# Patient Record
Sex: Female | Born: 1960 | Race: Black or African American | Hispanic: No | Marital: Single | Smoking: Never smoker
Health system: Southern US, Community
[De-identification: ages and names within clinical notes are randomized; demographics above are authoritative.]

## PROBLEM LIST (undated history)

## (undated) DIAGNOSIS — I2510 Atherosclerotic heart disease of native coronary artery without angina pectoris: Secondary | ICD-10-CM

## (undated) DIAGNOSIS — I509 Heart failure, unspecified: Secondary | ICD-10-CM

## (undated) DIAGNOSIS — I252 Old myocardial infarction: Secondary | ICD-10-CM

## (undated) DIAGNOSIS — K829 Disease of gallbladder, unspecified: Secondary | ICD-10-CM

## (undated) DIAGNOSIS — J45909 Unspecified asthma, uncomplicated: Secondary | ICD-10-CM

## (undated) DIAGNOSIS — K219 Gastro-esophageal reflux disease without esophagitis: Secondary | ICD-10-CM

## (undated) DIAGNOSIS — E785 Hyperlipidemia, unspecified: Secondary | ICD-10-CM

## (undated) DIAGNOSIS — N200 Calculus of kidney: Secondary | ICD-10-CM

## (undated) DIAGNOSIS — I10 Essential (primary) hypertension: Secondary | ICD-10-CM

## (undated) DIAGNOSIS — M1990 Unspecified osteoarthritis, unspecified site: Secondary | ICD-10-CM

## (undated) DIAGNOSIS — F419 Anxiety disorder, unspecified: Secondary | ICD-10-CM

## (undated) DIAGNOSIS — E559 Vitamin D deficiency, unspecified: Secondary | ICD-10-CM

## (undated) DIAGNOSIS — D649 Anemia, unspecified: Secondary | ICD-10-CM

## (undated) DIAGNOSIS — Z923 Personal history of irradiation: Secondary | ICD-10-CM

## (undated) DIAGNOSIS — C50919 Malignant neoplasm of unspecified site of unspecified female breast: Secondary | ICD-10-CM

## (undated) DIAGNOSIS — M2550 Pain in unspecified joint: Secondary | ICD-10-CM

## (undated) DIAGNOSIS — M25569 Pain in unspecified knee: Secondary | ICD-10-CM

## (undated) HISTORY — DX: Disease of gallbladder, unspecified: K82.9

## (undated) HISTORY — PX: BREAST SURGERY: SHX581

## (undated) HISTORY — PX: CHOLECYSTECTOMY: SHX55

## (undated) HISTORY — PX: COLONOSCOPY: SHX174

## (undated) HISTORY — DX: Pain in unspecified knee: M25.569

## (undated) HISTORY — DX: Unspecified asthma, uncomplicated: J45.909

## (undated) HISTORY — PX: HAND SURGERY: SHX662

## (undated) HISTORY — DX: Hyperlipidemia, unspecified: E78.5

## (undated) HISTORY — PX: BREAST EXCISIONAL BIOPSY: SUR124

## (undated) HISTORY — DX: Pain in unspecified joint: M25.50

## (undated) HISTORY — DX: Heart failure, unspecified: I50.9

## (undated) HISTORY — PX: ROUX-EN-Y GASTRIC BYPASS: SHX1104

---

## 2001-03-15 DIAGNOSIS — I252 Old myocardial infarction: Secondary | ICD-10-CM

## 2001-03-15 HISTORY — DX: Old myocardial infarction: I25.2

## 2008-11-23 ENCOUNTER — Emergency Department (HOSPITAL_COMMUNITY): Admission: EM | Admit: 2008-11-23 | Discharge: 2008-11-23 | Payer: Self-pay | Admitting: Emergency Medicine

## 2010-04-06 ENCOUNTER — Encounter: Payer: Self-pay | Admitting: Family Medicine

## 2010-04-25 ENCOUNTER — Emergency Department (HOSPITAL_COMMUNITY)
Admission: EM | Admit: 2010-04-25 | Discharge: 2010-04-25 | Disposition: A | Discharge status: Home / Self Care | Payer: Self-pay | Attending: Emergency Medicine | Admitting: Emergency Medicine

## 2010-04-25 DIAGNOSIS — E119 Type 2 diabetes mellitus without complications: Secondary | ICD-10-CM | POA: Insufficient documentation

## 2010-04-25 DIAGNOSIS — R059 Cough, unspecified: Secondary | ICD-10-CM | POA: Insufficient documentation

## 2010-04-25 DIAGNOSIS — R05 Cough: Secondary | ICD-10-CM | POA: Insufficient documentation

## 2010-04-25 DIAGNOSIS — I10 Essential (primary) hypertension: Secondary | ICD-10-CM | POA: Insufficient documentation

## 2010-04-25 DIAGNOSIS — E785 Hyperlipidemia, unspecified: Secondary | ICD-10-CM | POA: Insufficient documentation

## 2010-06-19 LAB — CBC
HCT: 39.3 % (ref 36.0–46.0)
Hemoglobin: 13 g/dL (ref 12.0–15.0)
MCHC: 33 g/dL (ref 30.0–36.0)
MCV: 89.1 fL (ref 78.0–100.0)
Platelets: 278 10*3/uL (ref 150–400)
RBC: 4.41 MIL/uL (ref 3.87–5.11)
RDW: 14.8 % (ref 11.5–15.5)
WBC: 8.6 10*3/uL (ref 4.0–10.5)

## 2010-06-19 LAB — DIFFERENTIAL
Basophils Absolute: 0.2 10*3/uL — ABNORMAL HIGH (ref 0.0–0.1)
Basophils Relative: 2 % — ABNORMAL HIGH (ref 0–1)
Eosinophils Absolute: 0.3 10*3/uL (ref 0.0–0.7)
Eosinophils Relative: 3 % (ref 0–5)
Lymphocytes Relative: 41 % (ref 12–46)
Lymphs Abs: 3.5 10*3/uL (ref 0.7–4.0)
Monocytes Absolute: 0.8 10*3/uL (ref 0.1–1.0)
Monocytes Relative: 9 % (ref 3–12)
Neutro Abs: 3.8 10*3/uL (ref 1.7–7.7)
Neutrophils Relative %: 44 % (ref 43–77)

## 2010-06-19 LAB — POCT CARDIAC MARKERS
CKMB, poc: 1.3 ng/mL (ref 1.0–8.0)
CKMB, poc: 1.5 ng/mL (ref 1.0–8.0)
CKMB, poc: <1 ng/mL — ABNORMAL LOW (ref 1.0–8.0)
Myoglobin, poc: 106 ng/mL (ref 12–200)
Myoglobin, poc: 73.6 ng/mL (ref 12–200)
Myoglobin, poc: 98.2 ng/mL (ref 12–200)
Troponin i, poc: 0.06 ng/mL (ref 0.00–0.09)
Troponin i, poc: <0.05 ng/mL (ref 0.00–0.09)
Troponin i, poc: <0.05 ng/mL (ref 0.00–0.09)

## 2010-06-19 LAB — URINALYSIS, ROUTINE W REFLEX MICROSCOPIC
Bilirubin Urine: NEGATIVE
Glucose, UA: NEGATIVE mg/dL
Hgb urine dipstick: NEGATIVE
Ketones, ur: NEGATIVE mg/dL
Nitrite: NEGATIVE
Protein, ur: NEGATIVE mg/dL
Specific Gravity, Urine: 1.011 (ref 1.005–1.030)
Urobilinogen, UA: 0.2 mg/dL (ref 0.0–1.0)
pH: 7 (ref 5.0–8.0)

## 2010-06-19 LAB — URINE MICROSCOPIC-ADD ON

## 2010-06-19 LAB — POCT I-STAT, CHEM 8
BUN: 11 mg/dL (ref 6–23)
Calcium, Ion: 1.07 mmol/L — ABNORMAL LOW (ref 1.12–1.32)
Chloride: 102 mEq/L (ref 96–112)
Creatinine, Ser: 0.9 mg/dL (ref 0.4–1.2)
Glucose, Bld: 118 mg/dL — ABNORMAL HIGH (ref 70–99)
HCT: 41 % (ref 36.0–46.0)
Hemoglobin: 13.9 g/dL (ref 12.0–15.0)
Potassium: 3.3 mEq/L — ABNORMAL LOW (ref 3.5–5.1)
Sodium: 140 mEq/L (ref 135–145)
TCO2: 27 mmol/L (ref 0–100)

## 2010-06-19 LAB — D-DIMER, QUANTITATIVE: D-Dimer, Quant: 0.26 ug/mL-FEU (ref 0.00–0.48)

## 2010-06-19 LAB — PREGNANCY, URINE: Preg Test, Ur: NEGATIVE

## 2011-04-04 ENCOUNTER — Emergency Department (HOSPITAL_COMMUNITY)
Admission: EM | Admit: 2011-04-04 | Discharge: 2011-04-04 | Disposition: A | Discharge status: Home / Self Care | Payer: Self-pay | Attending: Emergency Medicine | Admitting: Emergency Medicine

## 2011-04-04 ENCOUNTER — Encounter (HOSPITAL_COMMUNITY): Payer: Self-pay | Admitting: Emergency Medicine

## 2011-04-04 DIAGNOSIS — R0989 Other specified symptoms and signs involving the circulatory and respiratory systems: Secondary | ICD-10-CM | POA: Insufficient documentation

## 2011-04-04 DIAGNOSIS — J3489 Other specified disorders of nose and nasal sinuses: Secondary | ICD-10-CM | POA: Insufficient documentation

## 2011-04-04 DIAGNOSIS — R51 Headache: Secondary | ICD-10-CM | POA: Insufficient documentation

## 2011-04-04 DIAGNOSIS — R05 Cough: Secondary | ICD-10-CM | POA: Insufficient documentation

## 2011-04-04 DIAGNOSIS — J069 Acute upper respiratory infection, unspecified: Secondary | ICD-10-CM | POA: Insufficient documentation

## 2011-04-04 DIAGNOSIS — IMO0001 Reserved for inherently not codable concepts without codable children: Secondary | ICD-10-CM | POA: Insufficient documentation

## 2011-04-04 DIAGNOSIS — R059 Cough, unspecified: Secondary | ICD-10-CM | POA: Insufficient documentation

## 2011-04-04 DIAGNOSIS — I10 Essential (primary) hypertension: Secondary | ICD-10-CM | POA: Insufficient documentation

## 2011-04-04 DIAGNOSIS — E119 Type 2 diabetes mellitus without complications: Secondary | ICD-10-CM | POA: Insufficient documentation

## 2011-04-04 DIAGNOSIS — R6889 Other general symptoms and signs: Secondary | ICD-10-CM | POA: Insufficient documentation

## 2011-04-04 HISTORY — DX: Essential (primary) hypertension: I10

## 2011-04-04 MED ORDER — HYDROCOD POLST-CHLORPHEN POLST 10-8 MG/5ML PO LQCR: 5.0000 mL | Freq: Two times a day (BID) | ORAL | Status: DC | PRN

## 2011-04-04 MED ORDER — FLUTICASONE PROPIONATE 50 MCG/ACT NA SUSP: 2.0000 | Freq: Every day | NASAL | Status: DC

## 2011-04-04 NOTE — ED Notes (Signed)
Dc instructions given and understanding verbalized 

## 2011-04-04 NOTE — ED Notes (Signed)
C/o productive cough with clear sputum, congestion, headache, toothache, and body aches since Wednesday.

## 2011-04-04 NOTE — ED Provider Notes (Signed)
Medical screening examination/treatment/procedure(s) were performed by non-physician practitioner and as supervising physician I was immediately available for consultation/collaboration.   Dione Booze, MD 04/04/11 [phone]

## 2011-04-04 NOTE — ED Provider Notes (Signed)
History     CSN: [id number]  Arrival date & time 04/04/11  1758   First MD Initiated Contact with Patient 04/04/11 1929      Chief Complaint  Patient presents with  . Cough    (Consider location/radiation/quality/duration/timing/severity/associated sxs/prior treatment) HPI  51 year old female presenting to the ED with chief complaints of flulike symptoms. Patient complaining of nonproductive cough, sneezing, nasal congestion, chest congestions, myalgia for the past 4 days. She also has associated headache and body aches. She denies fever, vomiting, diarrhea, rash. She has tried over-the-counter medication without relief. She has had sick contact. She has not had a flu shot this year. She has appetite and does eat and drink as normal.  Past Medical History  Diagnosis Date  . Hypertension   . Diabetes mellitus     Past Surgical History  Procedure Date  . Breast surgery   . Cholecystectomy     No family history on file.  History  Substance Use Topics  . Smoking status: Never Smoker   . Smokeless tobacco: Not on file  . Alcohol Use: No    OB History    Grav Para Term Preterm Abortions TAB SAB Ect Mult Living                  Review of Systems  All other systems reviewed and are negative.    Allergies  Review of patient's allergies indicates no known allergies.  Home Medications   Current Outpatient Rx  Name Route Sig Dispense Refill  . LISINOPRIL-HYDROCHLOROTHIAZIDE 20-25 MG PO TABS Oral Take 1 tablet by mouth daily.    Marland Kitchen METFORMIN HCL 500 MG PO TABS Oral Take 500 mg by mouth 2 (two) times daily with a meal.      BP 176/89  Pulse 81  Temp(Src) 98.6 F (37 C) (Oral)  Resp 16  SpO2 98%  LMP 04/02/2011  Physical Exam  Nursing note and vitals reviewed. Constitutional: She is oriented to person, place, and time. She appears well-developed and well-nourished. No distress.       Awake, alert, nontoxic appearance  HENT:  Head: Normocephalic and  atraumatic.  Right Ear: External ear normal.  Left Ear: External ear normal.  Nose: Nose normal.  Mouth/Throat: Oropharynx is clear and moist. No oropharyngeal exudate.  Eyes: Conjunctivae and EOM are normal. Pupils are equal, round, and reactive to light. Right eye exhibits no discharge. Left eye exhibits no discharge.  Neck: Neck supple.  Cardiovascular: Normal rate and regular rhythm.   Pulmonary/Chest: Effort normal and breath sounds normal. No respiratory distress. She has no wheezes. She has no rales. She exhibits no tenderness.  Abdominal: Soft. There is no tenderness. There is no rebound.  Musculoskeletal: She exhibits no tenderness.       Baseline ROM, no obvious new focal weakness  Lymphadenopathy:    She has no cervical adenopathy.  Neurological: She is alert and oriented to person, place, and time.       Mental status and motor strength appears baseline for patient and situation  Skin: No rash noted.  Psychiatric: She has a normal mood and affect.    ED Course  Procedures (including critical care time)  Labs Reviewed - No data to display No results found.   No diagnosis found.    MDM  URI symptoms.  Reassurance given.  Pt is afebrile with stable vital sign.  Her lung is clear to auscultation bilaterally.  Pt will be d/c with flonase and cough medication.  Fayrene Helper, PA-C 04/04/11 2032

## 2011-05-22 ENCOUNTER — Encounter (HOSPITAL_COMMUNITY): Payer: Self-pay | Admitting: *Deleted

## 2011-05-22 ENCOUNTER — Emergency Department (HOSPITAL_COMMUNITY)
Admission: EM | Admit: 2011-05-22 | Discharge: 2011-05-22 | Disposition: A | Discharge status: Home Health | Payer: Self-pay | Attending: Emergency Medicine | Admitting: Emergency Medicine

## 2011-05-22 ENCOUNTER — Emergency Department (HOSPITAL_COMMUNITY): Payer: Self-pay

## 2011-05-22 DIAGNOSIS — R112 Nausea with vomiting, unspecified: Secondary | ICD-10-CM | POA: Insufficient documentation

## 2011-05-22 DIAGNOSIS — Z79899 Other long term (current) drug therapy: Secondary | ICD-10-CM | POA: Insufficient documentation

## 2011-05-22 DIAGNOSIS — B9789 Other viral agents as the cause of diseases classified elsewhere: Secondary | ICD-10-CM | POA: Insufficient documentation

## 2011-05-22 DIAGNOSIS — R509 Fever, unspecified: Secondary | ICD-10-CM | POA: Insufficient documentation

## 2011-05-22 DIAGNOSIS — R059 Cough, unspecified: Secondary | ICD-10-CM | POA: Insufficient documentation

## 2011-05-22 DIAGNOSIS — R070 Pain in throat: Secondary | ICD-10-CM | POA: Insufficient documentation

## 2011-05-22 DIAGNOSIS — IMO0001 Reserved for inherently not codable concepts without codable children: Secondary | ICD-10-CM | POA: Insufficient documentation

## 2011-05-22 DIAGNOSIS — J3489 Other specified disorders of nose and nasal sinuses: Secondary | ICD-10-CM | POA: Insufficient documentation

## 2011-05-22 DIAGNOSIS — E119 Type 2 diabetes mellitus without complications: Secondary | ICD-10-CM | POA: Insufficient documentation

## 2011-05-22 DIAGNOSIS — M549 Dorsalgia, unspecified: Secondary | ICD-10-CM | POA: Insufficient documentation

## 2011-05-22 DIAGNOSIS — B349 Viral infection, unspecified: Secondary | ICD-10-CM

## 2011-05-22 DIAGNOSIS — R05 Cough: Secondary | ICD-10-CM

## 2011-05-22 DIAGNOSIS — H5710 Ocular pain, unspecified eye: Secondary | ICD-10-CM | POA: Insufficient documentation

## 2011-05-22 DIAGNOSIS — I10 Essential (primary) hypertension: Secondary | ICD-10-CM | POA: Insufficient documentation

## 2011-05-22 DIAGNOSIS — H9209 Otalgia, unspecified ear: Secondary | ICD-10-CM | POA: Insufficient documentation

## 2011-05-22 LAB — COMPREHENSIVE METABOLIC PANEL
ALT: 16 U/L (ref 0–35)
AST: 26 U/L (ref 0–37)
Albumin: 3.5 g/dL (ref 3.5–5.2)
Alkaline Phosphatase: 63 U/L (ref 39–117)
BUN: 12 mg/dL (ref 6–23)
CO2: 25 mEq/L (ref 19–32)
Calcium: 8.6 mg/dL (ref 8.4–10.5)
Chloride: 98 mEq/L (ref 96–112)
Creatinine, Ser: 0.94 mg/dL (ref 0.50–1.10)
GFR calc Af Amer: 82 mL/min — ABNORMAL LOW (ref >90)
GFR calc non Af Amer: 71 mL/min — ABNORMAL LOW (ref >90)
Glucose, Bld: 117 mg/dL — ABNORMAL HIGH (ref 70–99)
Potassium: 3.2 mEq/L — ABNORMAL LOW (ref 3.5–5.1)
Sodium: 135 mEq/L (ref 135–145)
Total Bilirubin: 0.2 mg/dL — ABNORMAL LOW (ref 0.3–1.2)
Total Protein: 7.4 g/dL (ref 6.0–8.3)

## 2011-05-22 LAB — DIFFERENTIAL
Basophils Absolute: 0 10*3/uL (ref 0.0–0.1)
Basophils Relative: 0 % (ref 0–1)
Eosinophils Absolute: 0 10*3/uL (ref 0.0–0.7)
Eosinophils Relative: 1 % (ref 0–5)
Lymphocytes Relative: 56 % — ABNORMAL HIGH (ref 12–46)
Lymphs Abs: 2.7 10*3/uL (ref 0.7–4.0)
Monocytes Absolute: 0.5 10*3/uL (ref 0.1–1.0)
Monocytes Relative: 11 % (ref 3–12)
Neutro Abs: 1.5 10*3/uL — ABNORMAL LOW (ref 1.7–7.7)
Neutrophils Relative %: 31 % — ABNORMAL LOW (ref 43–77)

## 2011-05-22 LAB — RAPID STREP SCREEN (MED CTR MEBANE ONLY): Streptococcus, Group A Screen (Direct): NEGATIVE

## 2011-05-22 LAB — CBC
HCT: 38.5 % (ref 36.0–46.0)
Hemoglobin: 13.3 g/dL (ref 12.0–15.0)
MCH: 29 pg (ref 26.0–34.0)
MCHC: 34.5 g/dL (ref 30.0–36.0)
MCV: 84.1 fL (ref 78.0–100.0)
Platelets: 225 10*3/uL (ref 150–400)
RBC: 4.58 MIL/uL (ref 3.87–5.11)
RDW: 14.2 % (ref 11.5–15.5)
WBC: 4.8 10*3/uL (ref 4.0–10.5)

## 2011-05-22 LAB — URINALYSIS, ROUTINE W REFLEX MICROSCOPIC
Bilirubin Urine: NEGATIVE
Glucose, UA: NEGATIVE mg/dL
Hgb urine dipstick: NEGATIVE
Ketones, ur: NEGATIVE mg/dL
Leukocytes, UA: NEGATIVE
Nitrite: NEGATIVE
Protein, ur: NEGATIVE mg/dL
Specific Gravity, Urine: 1.008 (ref 1.005–1.030)
Urobilinogen, UA: 0.2 mg/dL (ref 0.0–1.0)
pH: 6.5 (ref 5.0–8.0)

## 2011-05-22 LAB — POCT PREGNANCY, URINE: Preg Test, Ur: NEGATIVE

## 2011-05-22 MED ORDER — IBUPROFEN 600 MG PO TABS: 600.0000 mg | ORAL_TABLET | Freq: Four times a day (QID) | ORAL | Status: AC | PRN

## 2011-05-22 MED ORDER — BENZONATATE 100 MG PO CAPS: 100.0000 mg | ORAL_CAPSULE | Freq: Three times a day (TID) | ORAL | Status: AC | PRN

## 2011-05-22 MED ORDER — DEXAMETHASONE 6 MG PO TABS
12.0000 mg | ORAL_TABLET | ORAL | Status: AC
  Administered 2011-05-22: 12 mg via ORAL
  Filled 2011-05-22: qty 2

## 2011-05-22 NOTE — ED Provider Notes (Signed)
51 year old female has been sick with a respiratory illness for the last week. She's had ear pain, sore throat, cough. Symptoms have been stable and refractory to treatment with over-the-counter medications. On exam, she does have tonsillar erythema and hypertrophy. Her lungs are clear. This most likely represents a viral syndrome, but strep screen and chest x-ray will be obtained as evaluate possible bacterial illness. If negative, she will need to be treated symptomatically.  Dione Booze, MD 05/22/11 [phone]

## 2011-05-22 NOTE — ED Notes (Signed)
The pt has been ill since Monday with a sorethroat earache aching all over hoarseness.  Coughing cold sl temp

## 2011-05-22 NOTE — Discharge Instructions (Signed)
Take Motrin (maximum of 2400mg  per day) or tylenol (maximum of 4000mg  per day) scheduled for 2 days then as needed for pain.     Cough, Adult  A cough is a reflex that helps clear your throat and airways. It can help heal the body or may be a reaction to an irritated airway. A cough may only last 2 or 3 weeks (acute) or may last more than 8 weeks (chronic).  CAUSES Acute cough:  Viral or bacterial infections.  Chronic cough:  Infections.   Allergies.   Asthma.   Post-nasal drip.   Smoking.   Heartburn or acid reflux.   Some medicines.   Chronic lung problems (COPD).   Cancer.  SYMPTOMS   Cough.   Fever.   Chest pain.   Increased breathing rate.   High-pitched whistling sound when breathing (wheezing).   Colored mucus that you cough up (sputum).  TREATMENT   A bacterial cough may be treated with antibiotic medicine.   A viral cough must run its course and will not respond to antibiotics.   Your caregiver may recommend other treatments if you have a chronic cough.  HOME CARE INSTRUCTIONS   Only take over-the-counter or prescription medicines for pain, discomfort, or fever as directed by your caregiver. Use cough suppressants only as directed by your caregiver.   Use a cold steam vaporizer or humidifier in your bedroom or home to help loosen secretions.   Sleep in a semi-upright position if your cough is worse at night.   Rest as needed.   Stop smoking if you smoke.  SEEK IMMEDIATE MEDICAL CARE IF:   You have pus in your sputum.   Your cough starts to worsen.   You cannot control your cough with suppressants and are losing sleep.   You begin coughing up blood.   You have difficulty breathing.   You develop pain which is getting worse or is uncontrolled with medicine.   You have a fever.  MAKE SURE YOU:   Understand these instructions.   Will watch your condition.   Will get help right away if you are not doing well or get worse.  Document  Released: 08/28/2010 Document Revised: 02/18/2011 Document Reviewed: 08/28/2010 [facility] Patient Information 2012 [employer], [place].    Cough, Adult  A cough is a reflex that helps clear your throat and airways. It can help heal the body or may be a reaction to an irritated airway. A cough may only last 2 or 3 weeks (acute) or may last more than 8 weeks (chronic).  CAUSES Acute cough:  Viral or bacterial infections.  Chronic cough:  Infections.   Allergies.   Asthma.   Post-nasal drip.   Smoking.   Heartburn or acid reflux.   Some medicines.   Chronic lung problems (COPD).   Cancer.  SYMPTOMS   Cough.   Fever.   Chest pain.   Increased breathing rate.   High-pitched whistling sound when breathing (wheezing).   Colored mucus that you cough up (sputum).  TREATMENT   A bacterial cough may be treated with antibiotic medicine.   A viral cough must run its course and will not respond to antibiotics.   Your caregiver may recommend other treatments if you have a chronic cough.  HOME CARE INSTRUCTIONS   Only take over-the-counter or prescription medicines for pain, discomfort, or fever as directed by your caregiver. Use cough suppressants only as directed by your caregiver.   Use a cold steam vaporizer or humidifier  in your bedroom or home to help loosen secretions.   Sleep in a semi-upright position if your cough is worse at night.   Rest as needed.   Stop smoking if you smoke.  SEEK IMMEDIATE MEDICAL CARE IF:   You have pus in your sputum.   Your cough starts to worsen.   You cannot control your cough with suppressants and are losing sleep.   You begin coughing up blood.   You have difficulty breathing.   You develop pain which is getting worse or is uncontrolled with medicine.   You have a fever.  MAKE SURE YOU:   Understand these instructions.   Will watch your condition.   Will get help right away if you are not doing well or get worse.    Document Released: 08/28/2010 Document Revised: 02/18/2011 Document Reviewed: 08/28/2010 [facility] Patient Information 2012 [place], [place].

## 2011-05-22 NOTE — ED Notes (Signed)
Pt presents to department for evaluation of multiple complaints. States sore throat, cough, nausea/vomiting and fatigue x7 days. Denies abdominal pain. Abdomen soft and non tender to palpation. Bowel sounds present all quadrants. States decreased PO intake. She is alert and oriented x4. No signs of distress noted at the time.

## 2011-05-22 NOTE — ED Notes (Signed)
Pt resting quietly at the time. No signs of distress noted. Friend at the bedside. Vital signs stable.

## 2011-05-22 NOTE — ED Provider Notes (Signed)
History     CSN: [id number]  Arrival date & time 05/22/11  1638   First MD Initiated Contact with Patient 05/22/11 1720      Chief Complaint  Patient presents with  . Sore Throat  . Nausea  . Emesis    (Consider location/radiation/quality/duration/timing/severity/associated sxs/prior treatment) HPI  History provided by the patient.  51 year old female presenting with complaint of cough and sore throat.  Patient's symptoms began gradually about 5 days ago and has been constant and moderate to severe.  Patient reports persistent productive cough, sore throat, nasal congestion, bilateral ear pain, and some myalgias. Patient reports transient nausea, vomiting, and diarrhea, all of which resolved to 3 days ago. Patient also had one day of subjective fever which has also resolved.  No associated chest pain, significant shortness of breath, or abdominal pain. Patient has had urinary/stress incontinence with coughing without other urinary symptoms. Patient has had similar symptoms in the past. Patient admits to many sick contacts with similar symptoms recently. Patient has had a history of strep throat; although last episode was about 6 years ago   Past Medical History  Diagnosis Date  . Hypertension   . Diabetes mellitus     Past Surgical History  Procedure Date  . Breast surgery   . Cholecystectomy     History reviewed. No pertinent family history.  History  Substance Use Topics  . Smoking status: Never Smoker   . Smokeless tobacco: Not on file  . Alcohol Use: No    OB History    Grav Para Term Preterm Abortions TAB SAB Ect Mult Living                  Review of Systems  Constitutional: Positive for fever (subjective fever 3 days ago - gone). Negative for chills.  HENT: Positive for ear pain (bilateral), congestion and sore throat. Negative for rhinorrhea.   Eyes: Positive for pain (bilateral eye "achiness" recently). Negative for visual disturbance.  Respiratory:  Positive for cough and wheezing. Negative for shortness of breath.   Cardiovascular: Negative for chest pain and palpitations.  Gastrointestinal: Positive for nausea (gone) and vomiting (2-3 days ago - gone). Negative for abdominal pain, diarrhea and blood in stool.  Genitourinary: Negative for dysuria and hematuria.  Musculoskeletal: Positive for back pain (generalized achy). Negative for gait problem.  Skin: Negative for rash and wound.  Neurological: Positive for headaches (occasional, diffuse, mild - gone). Negative for dizziness.  Psychiatric/Behavioral: Negative for confusion and agitation.  All other systems reviewed and are negative.    Allergies  Review of patient's allergies indicates no known allergies.  Home Medications   Current Outpatient Rx  Name Route Sig Dispense Refill  . ALBUTEROL SULFATE HFA 108 (90 BASE) MCG/ACT IN AERS Inhalation Inhale 2 puffs into the lungs every 6 (six) hours as needed. For shortness of breath    . ASPIRIN EFFERVESCENT 325 MG PO TBEF Oral Take 325 mg by mouth every 6 (six) hours as needed. For congestion    . LISINOPRIL-HYDROCHLOROTHIAZIDE 20-25 MG PO TABS Oral Take 1 tablet by mouth daily.    Marland Kitchen METFORMIN HCL 500 MG PO TABS Oral Take 500 mg by mouth 2 (two) times daily with a meal.    . PHENYLEPHRINE-DM 10-20 MG PO STRP Oral Take 10 mLs by mouth 2 (two) times daily.    Marland Kitchen PSEUDOEPHEDRINE-IBUPROFEN 30-200 MG PO TABS Oral Take 2 tablets by mouth daily.      BP 164/83  Pulse 78  Temp(Src) 98.6 F (37 C) (Oral)  Resp 16  SpO2 96%  LMP 04/24/2011  Physical Exam  Nursing note and vitals reviewed. Constitutional: She is oriented to person, place, and time. No distress.       Morbidly obese, alert, in no acute distress  HENT:  Head: Normocephalic and atraumatic.  Right Ear: External ear normal.  Left Ear: External ear normal.       Bilateral TM without sign of infection; posterior oropharynx with mild erythema, bilateral nodularity tonsils,  with mild amount of exudate on the right; no sign of asymmetry or peritonsillar abscess. Boggy turbinates more on the right  Eyes: Conjunctivae and EOM are normal. Pupils are equal, round, and reactive to light.  Neck: Normal range of motion. Neck supple. No Brudzinski's sign and no Kernig's sign noted.  Cardiovascular: Normal rate, regular rhythm and intact distal pulses.   No murmur heard. Pulmonary/Chest: Effort normal and breath sounds normal. No respiratory distress. She has no wheezes. She has no rales. She exhibits no tenderness.  Abdominal: Soft. Bowel sounds are normal. She exhibits no distension. There is no tenderness.       Obese  Musculoskeletal: Normal range of motion. She exhibits no edema.  Neurological: She is alert and oriented to person, place, and time.  Skin: Skin is warm and dry. No rash noted. She is not diaphoretic.  Psychiatric: She has a normal mood and affect. Judgment normal.    ED Course  Procedures (including critical care time)  Labs Reviewed  DIFFERENTIAL - Abnormal; Notable for the following:    Neutrophils Relative 31 (*)    Neutro Abs 1.5 (*)    Lymphocytes Relative 56 (*)    All other components within normal limits  COMPREHENSIVE METABOLIC PANEL - Abnormal; Notable for the following:    Potassium 3.2 (*)    Glucose, Bld 117 (*)    Total Bilirubin 0.2 (*)    GFR calc non Af Amer 71 (*)    GFR calc Af Amer 82 (*)    All other components within normal limits  URINALYSIS, ROUTINE W REFLEX MICROSCOPIC  CBC  POCT PREGNANCY, URINE  RAPID STREP SCREEN   Dg Chest 2 View  05/22/2011  *RADIOLOGY REPORT*  Clinical Data: Cough for the past week.  CHEST - 2 VIEW  Comparison: 11/23/2008.  Findings: Stable poor inspiration and grossly normal sized heart. Clear lungs.  Mild to moderate diffuse peribronchial thickening. Thoracic spine degenerative changes, including changes of DISH. Cholecystectomy clips.  IMPRESSION: Mild to moderate bronchitic changes.   Original Report Authenticated By: Darrol Angel, M.D.     1. Cough   2. Systemic viral illness      MDM  51 year old female presenting with complaint of cough, sore throat, URI symptoms with now resolved nausea vomiting diarrhea. No documented fever, subjective fever days ago. No abdominal pain or chest pain.   Exam as above, AF, mild tonsillar exudate, clear lungs.  Viral illness most likely.  Will r/o strep and PNA; labs ordered in triage and pending.  CXR without focal consolidation; possible Sn's of bronchitis, likely viral and pt not a current smoker.  Strep neg.  Labs neg for UTI and pt with NL WBC, Hgb, and creat; UPT neg.  Decadron given for sore throat/pain mgmt.  Will d/c with Sx mgmt and PCP f/u.          Particia Lather, MD 05/23/11 [phone]

## 2011-05-23 NOTE — ED Provider Notes (Signed)
I saw and evaluated the patient, reviewed the resident's note and I agree with the findings and plan.   Dione Booze, MD 05/23/11 [phone]

## 2011-08-13 ENCOUNTER — Other Ambulatory Visit (HOSPITAL_COMMUNITY)
Admission: RE | Admit: 2011-08-13 | Discharge: 2011-08-13 | Disposition: A | Discharge status: Home / Self Care | Payer: BC Managed Care – PPO | Source: Ambulatory Visit | Attending: Family Medicine | Admitting: Family Medicine

## 2011-08-13 DIAGNOSIS — Z124 Encounter for screening for malignant neoplasm of cervix: Secondary | ICD-10-CM | POA: Insufficient documentation

## 2011-08-13 DIAGNOSIS — Z1159 Encounter for screening for other viral diseases: Secondary | ICD-10-CM | POA: Insufficient documentation

## 2012-04-14 ENCOUNTER — Other Ambulatory Visit: Payer: Self-pay | Admitting: *Deleted

## 2012-04-14 NOTE — Telephone Encounter (Signed)
error 

## 2012-08-29 ENCOUNTER — Other Ambulatory Visit: Payer: Self-pay

## 2012-08-29 DIAGNOSIS — Z1231 Encounter for screening mammogram for malignant neoplasm of breast: Secondary | ICD-10-CM

## 2012-10-03 ENCOUNTER — Ambulatory Visit
Admission: RE | Admit: 2012-10-03 | Discharge: 2012-10-03 | Disposition: A | Discharge status: Home / Self Care | Payer: BC Managed Care – PPO | Source: Ambulatory Visit

## 2012-10-03 DIAGNOSIS — Z1231 Encounter for screening mammogram for malignant neoplasm of breast: Secondary | ICD-10-CM

## 2012-10-17 ENCOUNTER — Other Ambulatory Visit: Payer: Self-pay | Admitting: Family Medicine

## 2012-10-17 DIAGNOSIS — R928 Other abnormal and inconclusive findings on diagnostic imaging of breast: Secondary | ICD-10-CM

## 2012-10-31 ENCOUNTER — Ambulatory Visit
Admission: RE | Admit: 2012-10-31 | Discharge: 2012-10-31 | Disposition: A | Discharge status: Home / Self Care | Payer: BC Managed Care – PPO | Source: Ambulatory Visit | Attending: Family Medicine | Admitting: Family Medicine

## 2012-10-31 DIAGNOSIS — R928 Other abnormal and inconclusive findings on diagnostic imaging of breast: Secondary | ICD-10-CM

## 2013-03-30 ENCOUNTER — Encounter (HOSPITAL_COMMUNITY): Payer: Self-pay | Admitting: Emergency Medicine

## 2013-03-30 ENCOUNTER — Emergency Department (HOSPITAL_COMMUNITY): Payer: BC Managed Care – PPO

## 2013-03-30 ENCOUNTER — Emergency Department (HOSPITAL_COMMUNITY)
Admission: EM | Admit: 2013-03-30 | Discharge: 2013-03-30 | Disposition: A | Discharge status: Home / Self Care | Payer: BC Managed Care – PPO | Attending: Emergency Medicine | Admitting: Emergency Medicine

## 2013-03-30 DIAGNOSIS — E119 Type 2 diabetes mellitus without complications: Secondary | ICD-10-CM | POA: Insufficient documentation

## 2013-03-30 DIAGNOSIS — I10 Essential (primary) hypertension: Secondary | ICD-10-CM | POA: Insufficient documentation

## 2013-03-30 DIAGNOSIS — R609 Edema, unspecified: Secondary | ICD-10-CM | POA: Insufficient documentation

## 2013-03-30 DIAGNOSIS — R69 Illness, unspecified: Secondary | ICD-10-CM | POA: Insufficient documentation

## 2013-03-30 DIAGNOSIS — R0609 Other forms of dyspnea: Secondary | ICD-10-CM | POA: Insufficient documentation

## 2013-03-30 DIAGNOSIS — R5381 Other malaise: Secondary | ICD-10-CM | POA: Insufficient documentation

## 2013-03-30 DIAGNOSIS — R05 Cough: Secondary | ICD-10-CM | POA: Insufficient documentation

## 2013-03-30 DIAGNOSIS — R5383 Other fatigue: Secondary | ICD-10-CM | POA: Insufficient documentation

## 2013-03-30 DIAGNOSIS — R0600 Dyspnea, unspecified: Secondary | ICD-10-CM

## 2013-03-30 DIAGNOSIS — I252 Old myocardial infarction: Secondary | ICD-10-CM | POA: Insufficient documentation

## 2013-03-30 DIAGNOSIS — R059 Cough, unspecified: Secondary | ICD-10-CM | POA: Insufficient documentation

## 2013-03-30 DIAGNOSIS — I509 Heart failure, unspecified: Secondary | ICD-10-CM

## 2013-03-30 DIAGNOSIS — R0989 Other specified symptoms and signs involving the circulatory and respiratory systems: Secondary | ICD-10-CM | POA: Insufficient documentation

## 2013-03-30 DIAGNOSIS — Z79899 Other long term (current) drug therapy: Secondary | ICD-10-CM | POA: Insufficient documentation

## 2013-03-30 HISTORY — DX: Old myocardial infarction: I25.2

## 2013-03-30 LAB — BASIC METABOLIC PANEL
BUN: 13 mg/dL (ref 6–23)
CO2: 27 mEq/L (ref 19–32)
Calcium: 9 mg/dL (ref 8.4–10.5)
Chloride: 100 mEq/L (ref 96–112)
Creatinine, Ser: 0.89 mg/dL (ref 0.50–1.10)
GFR calc Af Amer: 86 mL/min — ABNORMAL LOW (ref >90)
GFR calc non Af Amer: 74 mL/min — ABNORMAL LOW (ref >90)
Glucose, Bld: 131 mg/dL — ABNORMAL HIGH (ref 70–99)
Potassium: 3.3 mEq/L — ABNORMAL LOW (ref 3.7–5.3)
Sodium: 142 mEq/L (ref 137–147)

## 2013-03-30 LAB — CBC WITH DIFFERENTIAL/PLATELET
Basophils Absolute: 0 10*3/uL (ref 0.0–0.1)
Basophils Relative: 0 % (ref 0–1)
Eosinophils Absolute: 0 10*3/uL (ref 0.0–0.7)
Eosinophils Relative: 0 % (ref 0–5)
HCT: 37.7 % (ref 36.0–46.0)
Hemoglobin: 12.3 g/dL (ref 12.0–15.0)
Lymphocytes Relative: 28 % (ref 12–46)
Lymphs Abs: 3 10*3/uL (ref 0.7–4.0)
MCH: 27.6 pg (ref 26.0–34.0)
MCHC: 32.6 g/dL (ref 30.0–36.0)
MCV: 84.7 fL (ref 78.0–100.0)
Monocytes Absolute: 0.7 10*3/uL (ref 0.1–1.0)
Monocytes Relative: 7 % (ref 3–12)
Neutro Abs: 6.7 10*3/uL (ref 1.7–7.7)
Neutrophils Relative %: 64 % (ref 43–77)
Platelets: 271 10*3/uL (ref 150–400)
RBC: 4.45 MIL/uL (ref 3.87–5.11)
RDW: 14.6 % (ref 11.5–15.5)
WBC: 10.5 10*3/uL (ref 4.0–10.5)

## 2013-03-30 LAB — PRO B NATRIURETIC PEPTIDE: Pro B Natriuretic peptide (BNP): 784 pg/mL — ABNORMAL HIGH (ref 0–125)

## 2013-03-30 LAB — TROPONIN I: Troponin I: <0.3 ng/mL (ref <0.30)

## 2013-03-30 MED ORDER — FUROSEMIDE 10 MG/ML IJ SOLN
40.0000 mg | Freq: Once | INTRAMUSCULAR | Status: AC
  Administered 2013-03-30: 40 mg via INTRAVENOUS
  Filled 2013-03-30: qty 4

## 2013-03-30 MED ORDER — POTASSIUM CHLORIDE CRYS ER 20 MEQ PO TBCR: 20.0000 meq | EXTENDED_RELEASE_TABLET | Freq: Two times a day (BID) | ORAL | Status: DC

## 2013-03-30 MED ORDER — FUROSEMIDE 20 MG PO TABS: 20.0000 mg | ORAL_TABLET | Freq: Two times a day (BID) | ORAL | Status: DC

## 2013-03-30 MED ORDER — POTASSIUM CHLORIDE CRYS ER 20 MEQ PO TBCR
60.0000 meq | EXTENDED_RELEASE_TABLET | Freq: Once | ORAL | Status: AC
  Administered 2013-03-30: 60 meq via ORAL
  Filled 2013-03-30: qty 3

## 2013-03-30 NOTE — ED Notes (Signed)
Per pt, started getting sick yesterday, saw PCP and was told to come to ED for chest xray-states MD heard crackles

## 2013-03-30 NOTE — ED Provider Notes (Signed)
CSN: [id number]     Arrival date & time 03/30/13  1324 History   First MD Initiated Contact with Patient 03/30/13 1500     Chief Complaint  Patient presents with  . Shortness of Breath   (Consider location/radiation/quality/duration/timing/severity/associated sxs/prior Treatment) HPI  52yF with SOB. Began feeling "sick" yesterday. Cough. Generalized fatigue, no energy. No fever. Has felt chilled. Sob worse when laying in back. No unusual leg pain or swelling. No sick contacts. No urinary complaints. Nonsmoker.   Past Medical History  Diagnosis Date  . Hypertension   . Diabetes mellitus   . 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 Breast surgery    . Cholecystectomy     No family history on file. History  Substance Use Topics  . Smoking status: Never Smoker   . Smokeless tobacco: Not on file  . Alcohol Use: No   OB History   Grav Para Term Preterm Abortions TAB SAB Ect Mult Living                 Review of Systems  All systems reviewed and negative, other than as noted in HPI.   Allergies  Lisinopril-hydrochlorothiazide  Home Medications   Current Outpatient Rx  Name  Route  Sig  Dispense  Refill  . albuterol (PROVENTIL HFA;VENTOLIN HFA) 108 (90 BASE) MCG/ACT inhaler   Inhalation   Inhale 2 puffs into the lungs every 6 (six) hours as needed. For shortness of breath         . ibuprofen (ADVIL,MOTRIN) 200 MG tablet   Oral   Take 800 mg by mouth every 6 (six) hours as needed.         . sitaGLIPtin (JANUVIA) 100 MG tablet   Oral   Take 50 mg by mouth daily.          BP 181/92  Pulse 76  Temp(Src) 98.1 F (36.7 C) (Oral)  Resp 20  SpO2 95%  LMP 03/21/2013 Physical Exam  Nursing note and vitals reviewed. Constitutional: She appears well-developed and well-nourished. No distress.  HENT:  Head: Normocephalic and atraumatic.  Eyes: Conjunctivae are normal. Right eye exhibits no discharge. Left eye  exhibits no discharge.  Neck: Neck supple.  Cardiovascular: Normal rate, regular rhythm and normal heart sounds.  Exam reveals no gallop and no friction rub.   No murmur heard. Pulmonary/Chest: Effort normal and breath sounds normal. No respiratory distress.  Crackles b/l bases  Abdominal: Soft. She exhibits no distension. There is no tenderness.  Musculoskeletal: She exhibits edema. She exhibits no tenderness.  Neurological: She is alert.  Skin: Skin is warm and dry.  Psychiatric: She has a normal mood and affect. Her behavior is normal. Thought content normal.    ED Course  Procedures (including critical care time) Labs Review Labs Reviewed  PRO B NATRIURETIC PEPTIDE - Abnormal; Notable for the following:    Pro B Natriuretic peptide (BNP) 784.0 (*)    All other components within normal limits  BASIC METABOLIC PANEL - Abnormal; Notable for the following:    Potassium 3.3 (*)    Glucose, Bld 131 (*)    GFR calc non Af Amer 74 (*)    GFR calc Af Amer 86 (*)    All other components within normal limits  TROPONIN I  CBC WITH DIFFERENTIAL   Imaging Review Dg Chest 2 View  03/30/2013   CLINICAL DATA:  Shortness of breath and  mid chest pain beginning 1 day ago.  EXAM: CHEST  2 VIEW  COMPARISON:  PA and lateral chest 05/22/2011.  FINDINGS: The lungs are clear. Heart size is normal. No pneumothorax or pleural effusion.  IMPRESSION: No acute disease.   Electronically Signed   By: Inge Rise M.D.   On: 03/30/2013 14:33    EKG Interpretation    Date/Time:  Friday March 30 2013 15:47:59 EST Ventricular Rate:  80 PR Interval:  164 QRS Duration: 86 QT Interval:  413 QTC Calculation: 476 R Axis:   36 Text Interpretation:  ED PHYSICIAN INTERPRETATION AVAILABLE IN [facility] Confirmed by TEST, RECORD ([id number]) on 04/01/2013 11:54:24 AM            MDM   1. Dyspnea   2. Heart failure    52yF with cough and sob. Clinically mild chf. Short course diuretics. Close pcp  fu. Return precautions discussed.    Virgel Manifold, MD 04/02/13 2256

## 2013-03-30 NOTE — Discharge Instructions (Signed)
Heart Failure °Heart failure is a condition in which the heart has trouble pumping blood. This means your heart does not pump blood efficiently for your body to work well. In some cases of heart failure, fluid may back up into your lungs or you may have swelling (edema) in your lower legs. Heart failure is usually a long-term (chronic) condition. It is important for you to take good care of yourself and follow your caregiver's treatment plan. °CAUSES  °Some health conditions can cause heart failure. Those health conditions include: °· High blood pressure (hypertension) causes the heart muscle to work harder than normal. When pressure in the blood vessels is high, the heart needs to pump (contract) with more force in order to circulate blood throughout the body. High blood pressure eventually causes the heart to become stiff and weak. °· Coronary artery disease (CAD) is the buildup of cholesterol and fat (plaque) in the arteries of the heart. The blockage in the arteries deprives the heart muscle of oxygen and blood. This can cause chest pain and may lead to a heart attack. High blood pressure can also contribute to CAD. °· Heart attack (myocardial infarction) occurs when 1 or more arteries in the heart become blocked. The loss of oxygen damages the muscle tissue of the heart. When this happens, part of the heart muscle dies. The injured tissue does not contract as well and weakens the heart's ability to pump blood. °· Abnormal heart valves can cause heart failure when the heart valves do not open and close properly. This makes the heart muscle pump harder to keep the blood flowing. °· Heart muscle disease (cardiomyopathy or myocarditis) is damage to the heart muscle from a variety of causes. These can include drug or alcohol abuse, infections, or unknown reasons. These can increase the risk of heart failure. °· Lung disease makes the heart work harder because the lungs do not work properly. This can cause a strain  on the heart, leading it to fail. °· Diabetes increases the risk of heart failure. High blood sugar contributes to high fat (lipid) levels in the blood. Diabetes can also cause slow damage to tiny blood vessels that carry important nutrients to the heart muscle. When the heart does not get enough oxygen and food, it can cause the heart to become weak and stiff. This leads to a heart that does not contract efficiently. °· Other conditions can contribute to heart failure. These include abnormal heart rhythms, thyroid problems, and low blood counts (anemia). °Certain unhealthy behaviors can increase the risk of heart failure. Those unhealthy behaviors include: °· Being overweight. °· Smoking or chewing tobacco. °· Eating foods high in fat and cholesterol. °· Abusing illicit drugs or alcohol. °· Lacking physical activity. °SYMPTOMS  °Heart failure symptoms may vary and can be hard to detect. Symptoms may include: °· Shortness of breath with activity, such as climbing stairs. °· Persistent cough. °· Swelling of the feet, ankles, legs, or abdomen. °· Unexplained weight gain. °· Difficulty breathing when lying flat (orthopnea). °· Waking from sleep because of the need to sit up and get more air. °· Rapid heartbeat. °· Fatigue and loss of energy. °· Feeling lightheaded, dizzy, or close to fainting. °· Loss of appetite. °· Nausea. °· Increased urination during the night (nocturia). °DIAGNOSIS  °A diagnosis of heart failure is based on your history, symptoms, physical examination, and diagnostic tests. °Diagnostic tests for heart failure may include: °· Echocardiography. °· Electrocardiography. °· Chest X-ray. °· Blood tests. °· Exercise   stress test. °· Cardiac angiography. °· Radionuclide scans. °TREATMENT  °Treatment is aimed at managing the symptoms of heart failure. Medicines, behavioral changes, or surgical intervention may be necessary to treat heart failure. °· Medicines to help treat heart failure may  include: °· Angiotensin-converting enzyme (ACE) inhibitors. This type of medicine blocks the effects of a blood protein called angiotensin-converting enzyme. ACE inhibitors relax (dilate) the blood vessels and help lower blood pressure. °· Angiotensin receptor blockers. This type of medicine blocks the actions of a blood protein called angiotensin. Angiotensin receptor blockers dilate the blood vessels and help lower blood pressure. °· Water pills (diuretics). Diuretics cause the kidneys to remove salt and water from the blood. The extra fluid is removed through urination. This loss of extra fluid lowers the volume of blood the heart pumps. °· Beta blockers. These prevent the heart from beating too fast and improve heart muscle strength. °· Digitalis. This increases the force of the heartbeat. °· Healthy behavior changes include: °· Obtaining and maintaining a healthy weight. °· Stopping smoking or chewing tobacco. °· Eating heart healthy foods. °· Limiting or avoiding alcohol. °· Stopping illicit drug use. °· Physical activity as directed by your caregiver. °· Surgical treatment for heart failure may include: °· A procedure to open blocked arteries, repair damaged heart valves, or remove damaged heart muscle tissue. °· A pacemaker to improve heart muscle function and control certain abnormal heart rhythms. °· An internal cardioverter defibrillator to treat certain serious abnormal heart rhythms. °· A left ventricular assist device to assist the pumping ability of the heart. °HOME CARE INSTRUCTIONS  °· Take your medicine as directed by your caregiver. Medicines are important in reducing the workload of your heart, slowing the progression of heart failure, and improving your symptoms. °· Do not stop taking your medicine unless directed by your caregiver. °· Do not skip any dose of medicine. °· Refill your prescriptions before you run out of medicine. Your medicines are needed every day. °· Take over-the-counter  medicine only as directed by your caregiver or pharmacist. °· Engage in moderate physical activity if directed by your caregiver. Moderate physical activity can benefit some people. The elderly and people with severe heart failure should consult with a caregiver for physical activity recommendations. °· Eat heart healthy foods. Food choices should be free of trans fat and low in saturated fat, cholesterol, and salt (sodium). Healthy choices include fresh or frozen fruits and vegetables, fish, lean meats, legumes, fat-free or low-fat dairy products, and whole grain or high fiber foods. Talk to a dietitian to learn more about heart healthy foods. °· Limit sodium if directed by your caregiver. Sodium restriction may reduce symptoms of heart failure in some people. Talk to a dietitian to learn more about heart healthy seasonings. °· Use healthy cooking methods. Healthy cooking methods include roasting, grilling, broiling, baking, poaching, steaming, or stir-frying. Talk to a dietitian to learn more about healthy cooking methods. °· Limit fluids if directed by your caregiver. Fluid restriction may reduce symptoms of heart failure in some people. °· Weigh yourself every day. Daily weights are important in the early recognition of excess fluid. You should weigh yourself every morning after you urinate and before you eat breakfast. Wear the same amount of clothing each time you weigh yourself. Record your daily weight. Provide your caregiver with your weight record. °· Monitor and record your blood pressure if directed by your caregiver. °· Check your pulse if directed by your caregiver. °· Lose weight if directed   by your caregiver. Weight loss may reduce symptoms of heart failure in some people. °· Stop smoking or chewing tobacco. Nicotine makes your heart work harder by causing your blood vessels to constrict. Do not use nicotine gum or patches before talking to your caregiver. °· Schedule and attend follow-up visits as  directed by your caregiver. It is important to keep all your appointments. °· Limit alcohol intake to no more than 1 drink per day for nonpregnant women and 2 drinks per day for men. Drinking more than that is harmful to your heart. Tell your caregiver if you drink alcohol several times a week. Talk with your caregiver about whether alcohol is safe for you. If your heart has already been damaged by alcohol or you have severe heart failure, drinking alcohol should be stopped completely. °· Stop illicit drug use. °· Stay up-to-date with immunizations. It is especially important to prevent respiratory infections through current pneumococcal and influenza immunizations. °· Manage other health conditions such as hypertension, diabetes, thyroid disease, or abnormal heart rhythms as directed by your caregiver. °· Learn to manage stress. °· Plan rest periods when fatigued. °· Learn strategies to manage high temperatures. If the weather is extremely hot: °· Avoid vigorous physical activity. °· Use air conditioning or fans or seek a cooler location. °· Avoid caffeine and alcohol. °· Wear loose-fitting, lightweight, and light-colored clothing. °· Learn strategies to manage cold temperatures. If the weather is extremely cold: °· Avoid vigorous physical activity. °· Layer clothes. °· Wear mittens or gloves, a hat, and a scarf when going outside. °· Avoid alcohol. °· Obtain ongoing education and support as needed. °· Participate or seek rehabilitation as needed to maintain or improve independence and quality of life. °SEEK MEDICAL CARE IF:  °· Your weight increases by 03 lb/1.4 kg in 1 day or 05 lb/2.3 kg in a week. °· You have increasing shortness of breath that is unusual for you. °· You are unable to participate in your usual physical activities. °· You tire easily. °· You cough more than normal, especially with physical activity. °· You have any or more swelling in areas such as your hands, feet, ankles, or abdomen. °· You  are unable to sleep because it is hard to breathe. °· You feel like your heart is beating fast (palpitations). °· You become dizzy or lightheaded upon standing up. °SEEK IMMEDIATE MEDICAL CARE IF:  °· You have difficulty breathing. °· There is a change in mental status such as decreased alertness or difficulty with concentration. °· You have a pain or discomfort in your chest. °· You have an episode of fainting (syncope). °MAKE SURE YOU:  °· Understand these instructions. °· Will watch your condition. °· Will get help right away if you are not doing well or get worse. °Document Released: 03/01/2005 Document Revised: 06/26/2012 Document Reviewed: 03/23/2012 °[employer]® Patient Information ©2014 [employer]. ° °

## 2013-03-30 NOTE — ED Notes (Signed)
Pt has ambulated 2 times to nearby restroom to void.

## 2013-03-30 NOTE — Progress Notes (Signed)
   CARE MANAGEMENT ED NOTE 03/30/2013  Patient:  Brackeen,Shyvonne   Account Number:  [id number]  Date Initiated:  03/30/2013  Documentation initiated by:  Jackelyn Poling  Subjective/Objective Assessment:   53 yr old [facility] pt states sharon wolter is pcp     Subjective/Objective Assessment Detail:     Action/Plan:   epic updated   Action/Plan Detail:   Anticipated DC Date:       Status Recommendation to Physician:   Result of Recommendation:    Other ED [facility]  Other  PCP issues  Outpatient Services - Pt will follow up    Choice offered to / List presented to:            Status of service:  Completed, signed off  ED Comments:   ED Comments Detail:

## 2013-08-27 ENCOUNTER — Other Ambulatory Visit: Payer: Self-pay | Admitting: Family Medicine

## 2013-08-27 DIAGNOSIS — D249 Benign neoplasm of unspecified breast: Secondary | ICD-10-CM

## 2013-10-04 ENCOUNTER — Ambulatory Visit
Admission: RE | Admit: 2013-10-04 | Discharge: 2013-10-04 | Disposition: A | Discharge status: Home / Self Care | Payer: BC Managed Care – PPO | Source: Ambulatory Visit | Attending: Family Medicine | Admitting: Family Medicine

## 2013-10-04 DIAGNOSIS — D249 Benign neoplasm of unspecified breast: Secondary | ICD-10-CM

## 2013-10-19 ENCOUNTER — Other Ambulatory Visit: Payer: Self-pay | Admitting: Obstetrics & Gynecology

## 2013-10-19 ENCOUNTER — Other Ambulatory Visit (HOSPITAL_COMMUNITY)
Admission: RE | Admit: 2013-10-19 | Discharge: 2013-10-19 | Disposition: A | Discharge status: Home / Self Care | Payer: BC Managed Care – PPO | Source: Ambulatory Visit | Attending: Obstetrics & Gynecology | Admitting: Obstetrics & Gynecology

## 2013-10-19 DIAGNOSIS — Z1151 Encounter for screening for human papillomavirus (HPV): Secondary | ICD-10-CM | POA: Insufficient documentation

## 2013-10-19 DIAGNOSIS — Z01419 Encounter for gynecological examination (general) (routine) without abnormal findings: Secondary | ICD-10-CM | POA: Insufficient documentation

## 2013-10-22 LAB — CYTOLOGY - PAP

## 2013-12-28 DIAGNOSIS — E785 Hyperlipidemia, unspecified: Secondary | ICD-10-CM | POA: Insufficient documentation

## 2013-12-28 DIAGNOSIS — E119 Type 2 diabetes mellitus without complications: Secondary | ICD-10-CM | POA: Insufficient documentation

## 2013-12-28 DIAGNOSIS — I10 Essential (primary) hypertension: Secondary | ICD-10-CM | POA: Insufficient documentation

## 2013-12-28 DIAGNOSIS — K219 Gastro-esophageal reflux disease without esophagitis: Secondary | ICD-10-CM | POA: Insufficient documentation

## 2014-08-09 DIAGNOSIS — Z9884 Bariatric surgery status: Secondary | ICD-10-CM | POA: Insufficient documentation

## 2014-08-09 DIAGNOSIS — E46 Unspecified protein-calorie malnutrition: Secondary | ICD-10-CM | POA: Insufficient documentation

## 2014-10-18 ENCOUNTER — Other Ambulatory Visit: Payer: Self-pay

## 2014-10-18 ENCOUNTER — Other Ambulatory Visit: Payer: Self-pay | Admitting: Family Medicine

## 2014-10-18 DIAGNOSIS — N6310 Unspecified lump in the right breast, unspecified quadrant: Secondary | ICD-10-CM

## 2014-10-22 ENCOUNTER — Ambulatory Visit
Admission: RE | Admit: 2014-10-22 | Discharge: 2014-10-22 | Disposition: A | Discharge status: Home / Self Care | Payer: BC Managed Care – PPO | Source: Ambulatory Visit

## 2014-10-22 DIAGNOSIS — N6310 Unspecified lump in the right breast, unspecified quadrant: Secondary | ICD-10-CM

## 2015-11-26 ENCOUNTER — Emergency Department (HOSPITAL_COMMUNITY)
Admission: EM | Admit: 2015-11-26 | Discharge: 2015-11-26 | Disposition: A | Discharge status: Home / Self Care | Payer: BC Managed Care – PPO | Attending: Emergency Medicine | Admitting: Emergency Medicine

## 2015-11-26 ENCOUNTER — Emergency Department (HOSPITAL_COMMUNITY): Payer: BC Managed Care – PPO

## 2015-11-26 DIAGNOSIS — R0789 Other chest pain: Secondary | ICD-10-CM | POA: Diagnosis not present

## 2015-11-26 DIAGNOSIS — R51 Headache: Secondary | ICD-10-CM | POA: Insufficient documentation

## 2015-11-26 DIAGNOSIS — I252 Old myocardial infarction: Secondary | ICD-10-CM | POA: Insufficient documentation

## 2015-11-26 DIAGNOSIS — I10 Essential (primary) hypertension: Secondary | ICD-10-CM | POA: Insufficient documentation

## 2015-11-26 DIAGNOSIS — Y939 Activity, unspecified: Secondary | ICD-10-CM | POA: Diagnosis not present

## 2015-11-26 DIAGNOSIS — E119 Type 2 diabetes mellitus without complications: Secondary | ICD-10-CM | POA: Insufficient documentation

## 2015-11-26 DIAGNOSIS — Y999 Unspecified external cause status: Secondary | ICD-10-CM | POA: Insufficient documentation

## 2015-11-26 DIAGNOSIS — M546 Pain in thoracic spine: Secondary | ICD-10-CM | POA: Diagnosis not present

## 2015-11-26 DIAGNOSIS — S199XXA Unspecified injury of neck, initial encounter: Secondary | ICD-10-CM | POA: Diagnosis not present

## 2015-11-26 DIAGNOSIS — Y92410 Unspecified street and highway as the place of occurrence of the external cause: Secondary | ICD-10-CM | POA: Insufficient documentation

## 2015-11-26 DIAGNOSIS — Z7982 Long term (current) use of aspirin: Secondary | ICD-10-CM | POA: Insufficient documentation

## 2015-11-26 MED ORDER — HYDROCODONE-ACETAMINOPHEN 5-325 MG PO TABS
1.0000 | ORAL_TABLET | Freq: Once | ORAL | Status: AC
  Administered 2015-11-26: 1 via ORAL
  Filled 2015-11-26: qty 1

## 2015-11-26 MED ORDER — LOSARTAN POTASSIUM 50 MG PO TABS: 100.0000 mg | ORAL_TABLET | Freq: Once | ORAL | Status: DC
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
## 2015-12-15 ENCOUNTER — Other Ambulatory Visit: Payer: Self-pay | Admitting: Family Medicine

## 2015-12-15 DIAGNOSIS — Z1231 Encounter for screening mammogram for malignant neoplasm of breast: Secondary | ICD-10-CM

## 2015-12-26 ENCOUNTER — Ambulatory Visit: Payer: BC Managed Care – PPO

## 2015-12-26 ENCOUNTER — Ambulatory Visit
Admission: RE | Admit: 2015-12-26 | Discharge: 2015-12-26 | Disposition: A | Discharge status: Home / Self Care | Payer: BC Managed Care – PPO | Source: Ambulatory Visit | Attending: Family Medicine | Admitting: Family Medicine

## 2015-12-26 DIAGNOSIS — Z1231 Encounter for screening mammogram for malignant neoplasm of breast: Secondary | ICD-10-CM

## 2016-01-01 ENCOUNTER — Other Ambulatory Visit: Payer: Self-pay | Admitting: Family Medicine

## 2016-01-01 DIAGNOSIS — R928 Other abnormal and inconclusive findings on diagnostic imaging of breast: Secondary | ICD-10-CM

## 2016-01-05 ENCOUNTER — Ambulatory Visit
Admission: RE | Admit: 2016-01-05 | Discharge: 2016-01-05 | Disposition: A | Discharge status: Home / Self Care | Payer: BC Managed Care – PPO | Source: Ambulatory Visit | Attending: Family Medicine | Admitting: Family Medicine

## 2016-01-05 DIAGNOSIS — R928 Other abnormal and inconclusive findings on diagnostic imaging of breast: Secondary | ICD-10-CM

## 2016-04-10 ENCOUNTER — Emergency Department (HOSPITAL_COMMUNITY)
Admission: EM | Admit: 2016-04-10 | Discharge: 2016-04-10 | Disposition: A | Discharge status: Home / Self Care | Payer: BC Managed Care – PPO | Attending: Emergency Medicine | Admitting: Emergency Medicine

## 2016-04-10 ENCOUNTER — Encounter (HOSPITAL_COMMUNITY): Payer: Self-pay | Admitting: Emergency Medicine

## 2016-04-10 DIAGNOSIS — E119 Type 2 diabetes mellitus without complications: Secondary | ICD-10-CM | POA: Diagnosis not present

## 2016-04-10 DIAGNOSIS — I10 Essential (primary) hypertension: Secondary | ICD-10-CM | POA: Insufficient documentation

## 2016-04-10 DIAGNOSIS — N760 Acute vaginitis: Secondary | ICD-10-CM | POA: Diagnosis not present

## 2016-04-10 DIAGNOSIS — Z79899 Other long term (current) drug therapy: Secondary | ICD-10-CM | POA: Insufficient documentation

## 2016-04-10 DIAGNOSIS — B9689 Other specified bacterial agents as the cause of diseases classified elsewhere: Secondary | ICD-10-CM

## 2016-04-10 DIAGNOSIS — I252 Old myocardial infarction: Secondary | ICD-10-CM | POA: Insufficient documentation

## 2016-04-10 DIAGNOSIS — N898 Other specified noninflammatory disorders of vagina: Secondary | ICD-10-CM | POA: Diagnosis present

## 2016-04-10 DIAGNOSIS — Z7982 Long term (current) use of aspirin: Secondary | ICD-10-CM | POA: Diagnosis not present

## 2016-04-10 LAB — WET PREP, GENITAL
Sperm: NONE SEEN
Trich, Wet Prep: NONE SEEN
Yeast Wet Prep HPF POC: NONE SEEN

## 2016-04-10 LAB — PREGNANCY, URINE: Preg Test, Ur: NEGATIVE

## 2016-04-10 MED ORDER — METRONIDAZOLE 500 MG PO TABS: 500.0000 mg | ORAL_TABLET | Freq: Two times a day (BID) | ORAL | 0 refills | Status: DC

## 2016-04-10 NOTE — ED Provider Notes (Signed)
[facility] DEPT Provider Note   CSN: [id number] Arrival date & time: 04/10/16  1639     History   Chief Complaint Chief Complaint  Patient presents with  . Vaginal Pain    HPI Katrina Beard is a 56 y.o. female.  HPI Patient percent with several days of vaginal itching and burning sensation. Eyes fever or chills. Denies bleeding or discharge. No new rashes. No abdominal pain. Patient says she's having regular periods.  Past Medical History:  Diagnosis Date  . Diabetes mellitus   . Hypertension   . 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   Past Surgical History:  Procedure Laterality Date  . BREAST SURGERY    . CHOLECYSTECTOMY      OB History    No data available       Home Medications    Prior to Admission medications   Medication Sig Start Date End Date Taking? Authorizing Provider  ALPRAZolam (XANAX) 0.25 MG tablet Take 0.125 mg by mouth daily as needed for anxiety. 10/23/15  Yes Historical Provider, MD  aspirin EC 81 MG tablet Take 81 mg by mouth daily.   Yes Historical Provider, MD  atorvastatin (LIPITOR) 20 MG tablet Take 20 mg by mouth daily. 10/30/15  Yes Historical Provider, MD  calcium carbonate (OS-CAL - DOSED IN MG OF ELEMENTAL CALCIUM) 1250 (500 Ca) MG tablet Take 3 tablets by mouth.   Yes Historical Provider, MD  losartan (COZAAR) 100 MG tablet Take 100 mg by mouth daily.   Yes Historical Provider, MD  Multiple Vitamins-Iron (MULTIVITAMINS WITH IRON) TABS tablet Take 3 tablets by mouth daily.   Yes Historical Provider, MD  metroNIDAZOLE (FLAGYL) 500 MG tablet Take 1 tablet (500 mg total) by mouth 2 (two) times daily. One po bid x 7 days 04/10/16   Julianne Rice, MD    Family History History reviewed. No pertinent family history.  Social History Social History  Substance Use Topics  . Smoking status: Never Smoker  . Smokeless tobacco: Not on file  . Alcohol use No     Allergies     Ibuprofen and Lisinopril-hydrochlorothiazide   Review of Systems Review of Systems  Constitutional: Negative for chills and fever.  Gastrointestinal: Negative for abdominal pain, nausea and vomiting.  Genitourinary: Positive for vaginal pain. Negative for difficulty urinating, dysuria, flank pain, frequency, hematuria, pelvic pain, urgency, vaginal bleeding and vaginal discharge.  Musculoskeletal: Negative for back pain and myalgias.  Skin: Negative for rash.  Neurological: Negative for dizziness, weakness, light-headedness and numbness.  All other systems reviewed and are negative.    Physical Exam Updated Vital Signs BP 168/72 (BP Location: Right Arm)   Pulse 63   Temp 98.1 F (36.7 C) (Oral)   Resp 20   LMP 04/05/2016   SpO2 97%   Physical Exam  Constitutional: She is oriented to person, place, and time. She appears well-developed and well-nourished. No distress.  HENT:  Head: Normocephalic and atraumatic.  Mouth/Throat: Oropharynx is clear and moist.  Eyes: EOM are normal. Pupils are equal, round, and reactive to light.  Neck: Normal range of motion. Neck supple.  Cardiovascular: Normal rate and regular rhythm.   Pulmonary/Chest: Effort normal and breath sounds normal.  Abdominal: Soft. Bowel sounds are normal. There is no tenderness. There is no rebound and no guarding.  Genitourinary: Vaginal discharge found.  Genitourinary Comments: Frothy vaginal discharge. No cervical motion tenderness. No fundal or  adnexal tenderness. No lesions or adenopathy noted.  Musculoskeletal: Normal range of motion. She exhibits no edema or tenderness.  No CVA tenderness to percussion.  Neurological: She is alert and oriented to person, place, and time.  Moves all extremities without deficit. Sensation intact.  Skin: Skin is warm and dry. Capillary refill takes less than 2 seconds. No rash noted. No erythema.  Psychiatric: She has a normal mood and affect. Her behavior is normal.   Nursing note and vitals reviewed.    ED Treatments / Results  Labs (all labs ordered are listed, but only abnormal results are displayed) Labs Reviewed  WET PREP, GENITAL - Abnormal; Notable for the following:       Result Value   Clue Cells Wet Prep HPF POC PRESENT (*)    WBC, Wet Prep HPF POC FEW (*)    All other components within normal limits  PREGNANCY, URINE  GC/CHLAMYDIA PROBE AMP ([place]) NOT AT [facility]    EKG  EKG Interpretation None       Radiology No results found.  Procedures Procedures (including critical care time)  Medications Ordered in ED Medications - No data to display   Initial Impression / Assessment and Plan / ED Course  I have reviewed the triage vital signs and the nursing notes.  Pertinent labs & imaging results that were available during my care of the patient were reviewed by me and considered in my medical decision making (see chart for details).     Clue cells on wet prep. We'll treat for bacterial vaginosis. Return precautions given.  Final Clinical Impressions(s) / ED Diagnoses   Final diagnoses:  BV (bacterial vaginosis)    New Prescriptions Discharge Medication List as of 04/10/2016 10:51 PM    START taking these medications   Details  metroNIDAZOLE (FLAGYL) 500 MG tablet Take 1 tablet (500 mg total) by mouth 2 (two) times daily. One po bid x 7 days, Starting Sat 04/10/2016, Print         Julianne Rice, MD 04/11/16 [phone]

## 2016-04-10 NOTE — ED Notes (Signed)
Pelvic exam performed by Dr. Lita Mains

## 2016-04-10 NOTE — ED Triage Notes (Signed)
Pt c/o sharp shooting distal vaginal pain, burning, itching, swelling. No dysuria, discharge, bleeding, odor. Self-treated with vagisil without success yesterday. No abdominal pain, nausea, emesis, diarrhea, fevers, chills.

## 2016-04-13 LAB — GC/CHLAMYDIA PROBE AMP (~~LOC~~) NOT AT ARMC
Chlamydia: NEGATIVE
Neisseria Gonorrhea: NEGATIVE

## 2016-06-19 ENCOUNTER — Encounter (HOSPITAL_COMMUNITY): Payer: Self-pay | Admitting: Family Medicine

## 2016-06-19 ENCOUNTER — Ambulatory Visit (HOSPITAL_COMMUNITY)
Admission: EM | Admit: 2016-06-19 | Discharge: 2016-06-19 | Disposition: A | Discharge status: Home / Self Care | Payer: BC Managed Care – PPO | Attending: Internal Medicine | Admitting: Internal Medicine

## 2016-06-19 DIAGNOSIS — N3001 Acute cystitis with hematuria: Secondary | ICD-10-CM | POA: Diagnosis not present

## 2016-06-19 DIAGNOSIS — N898 Other specified noninflammatory disorders of vagina: Secondary | ICD-10-CM | POA: Insufficient documentation

## 2016-06-19 DIAGNOSIS — Z79899 Other long term (current) drug therapy: Secondary | ICD-10-CM | POA: Diagnosis not present

## 2016-06-19 DIAGNOSIS — E119 Type 2 diabetes mellitus without complications: Secondary | ICD-10-CM | POA: Insufficient documentation

## 2016-06-19 DIAGNOSIS — I10 Essential (primary) hypertension: Secondary | ICD-10-CM | POA: Diagnosis not present

## 2016-06-19 DIAGNOSIS — Z7982 Long term (current) use of aspirin: Secondary | ICD-10-CM | POA: Diagnosis not present

## 2016-06-19 LAB — POCT URINALYSIS DIP (DEVICE)
Glucose, UA: NEGATIVE mg/dL
Ketones, ur: NEGATIVE mg/dL
Nitrite: NEGATIVE
Protein, ur: 100 mg/dL — AB
Specific Gravity, Urine: >=1.03 (ref 1.005–1.030)
Urobilinogen, UA: 0.2 mg/dL (ref 0.0–1.0)
pH: 6 (ref 5.0–8.0)

## 2016-06-19 MED ORDER — NITROFURANTOIN MONOHYD MACRO 100 MG PO CAPS: 100.0000 mg | ORAL_CAPSULE | Freq: Two times a day (BID) | ORAL | 0 refills | Status: DC

## 2016-06-19 MED ORDER — FLUCONAZOLE 150 MG PO TABS: 150.0000 mg | ORAL_TABLET | Freq: Every day | ORAL | 0 refills | Status: AC

## 2016-06-19 MED ORDER — METRONIDAZOLE 500 MG PO TABS: 2000.0000 mg | ORAL_TABLET | Freq: Once | ORAL | 0 refills | Status: AC

## 2016-06-19 MED ORDER — FLUCONAZOLE 150 MG PO TABS: 150.0000 mg | ORAL_TABLET | Freq: Every day | ORAL | 0 refills | Status: DC

## 2016-06-19 NOTE — ED Provider Notes (Signed)
CSN: [id number]     Arrival date & time 06/19/16  1702 History   First MD Initiated Contact with Patient 06/19/16 1828     Chief Complaint  Patient presents with  . Vaginal Itching   (Consider location/radiation/quality/duration/timing/severity/associated sxs/prior Treatment) Patient is here today for vaginal itchiness for 2 weeks in which she believes is due to irritation from the new body wash. Patent started this body wash approximately 2 weeks ago. Recently, she had a similar event with similar symptoms that she thought was also from a new body wash but symptoms went away after taking left over amoxicillin at home. However her symptoms is now returning after starting another new body wash 2 weeks ago.   She denies vaginal discharge but reports that she has a funky odor in her genitalia area. She is sexually active. She is not concern for STI. Patient denies dysuria but does have urinary frequency and last night she had one episode of lower abdominal pressure that went away after drinking cranberry juice.   BP is elevated; she is asymptomatic with no headache, CP, dizziness, SOB, or visual disturbances. She forgot to take her medicine this morning.       Past Medical History:  Diagnosis Date  . Diabetes mellitus   . Hypertension   . MI, old 2003   no stent placement   Past Surgical History:  Procedure Laterality Date  . BREAST SURGERY    . CHOLECYSTECTOMY     History reviewed. No pertinent family history. Social History  Substance Use Topics  . Smoking status: Never Smoker  . Smokeless tobacco: Never Used  . Alcohol use No   OB History    No data available     Review of Systems  Constitutional:       As stated in the HPI    Allergies  Ibuprofen and Lisinopril-hydrochlorothiazide  Home Medications   Prior to Admission medications   Medication Sig Start Date End Date Taking? Authorizing Provider  ALPRAZolam (XANAX) 0.25 MG tablet Take 0.125 mg by mouth daily as  needed for anxiety. 10/23/15   Historical Provider, MD  aspirin EC 81 MG tablet Take 81 mg by mouth daily.    Historical Provider, MD  atorvastatin (LIPITOR) 20 MG tablet Take 20 mg by mouth daily. 10/30/15   Historical Provider, MD  calcium carbonate (OS-CAL - DOSED IN MG OF ELEMENTAL CALCIUM) 1250 (500 Ca) MG tablet Take 3 tablets by mouth.    Historical Provider, MD  losartan (COZAAR) 100 MG tablet Take 100 mg by mouth daily.    Historical Provider, MD  metroNIDAZOLE (FLAGYL) 500 MG tablet Take 1 tablet (500 mg total) by mouth 2 (two) times daily. One po bid x 7 days 04/10/16   Julianne Rice, MD  Multiple Vitamins-Iron (MULTIVITAMINS WITH IRON) TABS tablet Take 3 tablets by mouth daily.    Historical Provider, MD   Meds Ordered and Administered this Visit  Medications - No data to display  BP (!) 188/73   Pulse 65   Temp 98.1 F (36.7 C)   Resp 18   LMP 05/27/2016   SpO2 98%  No data found.   Physical Exam  Constitutional: She is oriented to person, place, and time. She appears well-developed and well-nourished.  HENT:  Head: Normocephalic and atraumatic.  Cardiovascular: Normal rate, regular rhythm and normal heart sounds.   Pulmonary/Chest: Effort normal and breath sounds normal.  Abdominal: Soft. Bowel sounds are normal. She exhibits no distension. There is no tenderness.  Genitourinary:  Genitourinary Comments: Negative CVA tenderness.   External labia major and minora symmetrical with no lesions. Some white thin discharge noted at the vaginal entrance. Vaginal canal pink moist without lesion. Moderate amt of white yellowish thin vaginal discharge present. -CMT, -adnexal mass/tenderness, -uterine tenderness  Neurological: She is alert and oriented to person, place, and time.  Skin: Skin is warm and dry.  Psychiatric: She has a normal mood and affect.  Nursing note and vitals reviewed.   Urgent Care Course     Procedures (including critical care time)  Labs  Review Labs Reviewed  POCT URINALYSIS DIP (DEVICE) - Abnormal; Notable for the following:       Result Value   Bilirubin Urine SMALL (*)    Hgb urine dipstick LARGE (*)    Protein, ur 100 (*)    Leukocytes, UA SMALL (*)    All other components within normal limits  URINE CULTURE  CERVICOVAGINAL ANCILLARY ONLY    Imaging Review No results found.   MDM   1. Vaginal discharge   2. Acute cystitis with hematuria    1) Will treat presumptively for BV and yeast with Flagyl 2G X 1 dose and Diflucan x 1 dose; repeat in 3 days if still symptomatic.   2) Start Macrobid BID x 5 days for possible UTI. Urine culture is pending.   3) Cytology pending for STI.     Barry Dienes, NP 06/19/16 [phone]

## 2016-06-19 NOTE — ED Triage Notes (Signed)
Pt here for 1 week of vaginal irritation due to new body wash. Denies  discharge.

## 2016-06-21 LAB — URINE CULTURE

## 2016-06-21 LAB — CERVICOVAGINAL ANCILLARY ONLY
Chlamydia: NEGATIVE
Neisseria Gonorrhea: NEGATIVE
Wet Prep (BD Affirm): POSITIVE — AB

## 2016-07-09 ENCOUNTER — Other Ambulatory Visit: Payer: Self-pay | Admitting: Family Medicine

## 2016-07-09 ENCOUNTER — Ambulatory Visit
Admission: RE | Admit: 2016-07-09 | Discharge: 2016-07-09 | Disposition: A | Discharge status: Home / Self Care | Payer: BC Managed Care – PPO | Source: Ambulatory Visit | Attending: Family Medicine | Admitting: Family Medicine

## 2016-07-09 DIAGNOSIS — R319 Hematuria, unspecified: Secondary | ICD-10-CM

## 2016-07-09 MED ORDER — IOPAMIDOL (ISOVUE-300) INJECTION 61%
125.0000 mL | Freq: Once | INTRAVENOUS | Status: AC | PRN
  Administered 2016-07-09: 125 mL via INTRAVENOUS

## 2016-07-24 ENCOUNTER — Emergency Department (HOSPITAL_COMMUNITY): Payer: BC Managed Care – PPO

## 2016-07-24 ENCOUNTER — Observation Stay (HOSPITAL_COMMUNITY)
Admission: EM | Admit: 2016-07-24 | Discharge: 2016-07-25 | Disposition: A | Discharge status: Home / Self Care | Payer: BC Managed Care – PPO | Attending: Urology | Admitting: Urology

## 2016-07-24 ENCOUNTER — Encounter (HOSPITAL_COMMUNITY): Payer: Self-pay | Admitting: Emergency Medicine

## 2016-07-24 DIAGNOSIS — I10 Essential (primary) hypertension: Secondary | ICD-10-CM | POA: Insufficient documentation

## 2016-07-24 DIAGNOSIS — N201 Calculus of ureter: Secondary | ICD-10-CM | POA: Diagnosis present

## 2016-07-24 DIAGNOSIS — N200 Calculus of kidney: Secondary | ICD-10-CM

## 2016-07-24 DIAGNOSIS — I252 Old myocardial infarction: Secondary | ICD-10-CM | POA: Insufficient documentation

## 2016-07-24 DIAGNOSIS — Z79899 Other long term (current) drug therapy: Secondary | ICD-10-CM | POA: Diagnosis not present

## 2016-07-24 DIAGNOSIS — Z419 Encounter for procedure for purposes other than remedying health state, unspecified: Secondary | ICD-10-CM

## 2016-07-24 DIAGNOSIS — N132 Hydronephrosis with renal and ureteral calculous obstruction: Principal | ICD-10-CM | POA: Insufficient documentation

## 2016-07-24 DIAGNOSIS — Z6841 Body Mass Index (BMI) 40.0 and over, adult: Secondary | ICD-10-CM | POA: Insufficient documentation

## 2016-07-24 DIAGNOSIS — N179 Acute kidney failure, unspecified: Secondary | ICD-10-CM | POA: Insufficient documentation

## 2016-07-24 DIAGNOSIS — E785 Hyperlipidemia, unspecified: Secondary | ICD-10-CM | POA: Diagnosis not present

## 2016-07-24 DIAGNOSIS — Z7982 Long term (current) use of aspirin: Secondary | ICD-10-CM | POA: Insufficient documentation

## 2016-07-24 DIAGNOSIS — Z9884 Bariatric surgery status: Secondary | ICD-10-CM | POA: Diagnosis not present

## 2016-07-24 DIAGNOSIS — E119 Type 2 diabetes mellitus without complications: Secondary | ICD-10-CM | POA: Insufficient documentation

## 2016-07-24 LAB — I-STAT CHEM 8, ED
BUN: 18 mg/dL (ref 6–20)
Calcium, Ion: 1.08 mmol/L — ABNORMAL LOW (ref 1.15–1.40)
Chloride: 105 mmol/L (ref 101–111)
Creatinine, Ser: 1.7 mg/dL — ABNORMAL HIGH (ref 0.44–1.00)
Glucose, Bld: 115 mg/dL — ABNORMAL HIGH (ref 65–99)
HCT: 41 % (ref 36.0–46.0)
Hemoglobin: 13.9 g/dL (ref 12.0–15.0)
Potassium: 3.5 mmol/L (ref 3.5–5.1)
Sodium: 137 mmol/L (ref 135–145)
TCO2: 22 mmol/L (ref 0–100)

## 2016-07-24 LAB — CBC
HCT: 38.2 % (ref 36.0–46.0)
Hemoglobin: 12.6 g/dL (ref 12.0–15.0)
MCH: 28 pg (ref 26.0–34.0)
MCHC: 33 g/dL (ref 30.0–36.0)
MCV: 84.9 fL (ref 78.0–100.0)
Platelets: 263 10*3/uL (ref 150–400)
RBC: 4.5 MIL/uL (ref 3.87–5.11)
RDW: 14.6 % (ref 11.5–15.5)
WBC: 9.6 10*3/uL (ref 4.0–10.5)

## 2016-07-24 LAB — URINALYSIS, ROUTINE W REFLEX MICROSCOPIC
Bilirubin Urine: NEGATIVE
Glucose, UA: NEGATIVE mg/dL
Hgb urine dipstick: NEGATIVE
Ketones, ur: 20 mg/dL — AB
Nitrite: NEGATIVE
Protein, ur: 30 mg/dL — AB
Specific Gravity, Urine: 1.014 (ref 1.005–1.030)
pH: 6 (ref 5.0–8.0)

## 2016-07-24 LAB — I-STAT BETA HCG BLOOD, ED (MC, WL, AP ONLY): I-stat hCG, quantitative: <5 m[IU]/mL (ref <5)

## 2016-07-24 LAB — CBG MONITORING, ED: Glucose-Capillary: 119 mg/dL — ABNORMAL HIGH (ref 65–99)

## 2016-07-24 MED ORDER — MORPHINE SULFATE (PF) 4 MG/ML IV SOLN
4.0000 mg | Freq: Once | INTRAVENOUS | Status: AC
  Administered 2016-07-24: 4 mg via INTRAVENOUS
  Filled 2016-07-24: qty 1

## 2016-07-24 MED ORDER — PROMETHAZINE HCL 25 MG/ML IJ SOLN
25.0000 mg | Freq: Four times a day (QID) | INTRAMUSCULAR | Status: DC | PRN
  Administered 2016-07-24: 25 mg via INTRAVENOUS
  Filled 2016-07-24: qty 1

## 2016-07-24 MED ORDER — SODIUM CHLORIDE 0.9 % IV SOLN
INTRAVENOUS | Status: DC
  Administered 2016-07-24: 20:00:00 via INTRAVENOUS

## 2016-07-24 MED ORDER — ONDANSETRON HCL 4 MG/2ML IJ SOLN
INTRAMUSCULAR | Status: AC
  Administered 2016-07-24: 4 mg
  Filled 2016-07-24: qty 2

## 2016-07-24 MED ORDER — LOSARTAN POTASSIUM 50 MG PO TABS
100.0000 mg | ORAL_TABLET | Freq: Every day | ORAL | Status: DC
  Administered 2016-07-24: 100 mg via ORAL
  Filled 2016-07-24: qty 2

## 2016-07-24 MED ORDER — ONDANSETRON 4 MG PO TBDP
ORAL_TABLET | ORAL | Status: AC
  Filled 2016-07-24: qty 1

## 2016-07-24 MED ORDER — ACETAMINOPHEN 325 MG PO TABS: 650.0000 mg | ORAL_TABLET | ORAL | Status: DC | PRN

## 2016-07-24 MED ORDER — ALPRAZOLAM 0.25 MG PO TABS: 0.1250 mg | ORAL_TABLET | Freq: Every day | ORAL | Status: DC | PRN

## 2016-07-24 MED ORDER — SODIUM CHLORIDE 0.9 % IV BOLUS (SEPSIS)
1000.0000 mL | Freq: Once | INTRAVENOUS | Status: AC
  Administered 2016-07-24: 1000 mL via INTRAVENOUS

## 2016-07-24 MED ORDER — ONDANSETRON 4 MG PO TBDP
4.0000 mg | ORAL_TABLET | Freq: Once | ORAL | Status: AC
  Administered 2016-07-24: 4 mg via ORAL

## 2016-07-24 MED ORDER — DIPHENHYDRAMINE HCL 12.5 MG/5ML PO ELIX: 12.5000 mg | ORAL_SOLUTION | Freq: Four times a day (QID) | ORAL | Status: DC | PRN

## 2016-07-24 MED ORDER — MORPHINE SULFATE 2 MG/ML IV SOLN
INTRAVENOUS | Status: DC
  Administered 2016-07-24: 22:00:00 via INTRAVENOUS
  Administered 2016-07-25: 1 mg via INTRAVENOUS
  Administered 2016-07-25: 0 mg via INTRAVENOUS
  Filled 2016-07-24: qty 30

## 2016-07-24 MED ORDER — ONDANSETRON HCL 4 MG/2ML IJ SOLN
4.0000 mg | INTRAMUSCULAR | Status: DC | PRN
  Administered 2016-07-24: 4 mg via INTRAVENOUS
  Filled 2016-07-24: qty 2

## 2016-07-24 MED ORDER — ATORVASTATIN CALCIUM 20 MG PO TABS
20.0000 mg | ORAL_TABLET | Freq: Every day | ORAL | Status: DC
  Administered 2016-07-24: 20 mg via ORAL
  Filled 2016-07-24: qty 1

## 2016-07-24 MED ORDER — NALOXONE HCL 0.4 MG/ML IJ SOLN: 0.4000 mg | INTRAMUSCULAR | Status: DC | PRN

## 2016-07-24 MED ORDER — ONDANSETRON HCL 4 MG/2ML IJ SOLN
4.0000 mg | Freq: Once | INTRAMUSCULAR | Status: AC
  Administered 2016-07-24: 4 mg via INTRAVENOUS

## 2016-07-24 MED ORDER — DIPHENHYDRAMINE HCL 50 MG/ML IJ SOLN: 12.5000 mg | Freq: Four times a day (QID) | INTRAMUSCULAR | Status: DC | PRN

## 2016-07-24 MED ORDER — HYDROMORPHONE HCL 1 MG/ML IJ SOLN
0.5000 mg | INTRAMUSCULAR | Status: AC | PRN
  Administered 2016-07-24: 0.5 mg via INTRAVENOUS
  Filled 2016-07-24: qty 1

## 2016-07-24 MED ORDER — ONDANSETRON HCL 4 MG/2ML IJ SOLN: 4.0000 mg | Freq: Four times a day (QID) | INTRAMUSCULAR | Status: DC | PRN

## 2016-07-24 MED ORDER — SODIUM CHLORIDE 0.9 % IV SOLN
INTRAVENOUS | Status: DC
  Administered 2016-07-24: 21:00:00 via INTRAVENOUS

## 2016-07-24 MED ORDER — ONDANSETRON HCL 4 MG/2ML IJ SOLN
4.0000 mg | Freq: Once | INTRAMUSCULAR | Status: AC
  Administered 2016-07-24: 4 mg via INTRAVENOUS
  Filled 2016-07-24: qty 2

## 2016-07-24 MED ORDER — SODIUM CHLORIDE 0.9% FLUSH: 9.0000 mL | INTRAVENOUS | Status: DC | PRN

## 2016-07-24 NOTE — ED Triage Notes (Signed)
Pt to ED with c/o flank pain for 2 days with vomiting, diaphoretic, no hx of kidney stones. Nauseated-- given zofran at triage.

## 2016-07-24 NOTE — ED Notes (Signed)
Pt did not need anything at this time  

## 2016-07-24 NOTE — ED Notes (Signed)
Patient transported to CT 

## 2016-07-24 NOTE — ED Notes (Signed)
Pt became diaphoretic and pale while in triage room, placed in supine position in chair--

## 2016-07-24 NOTE — H&P (Signed)
H&P  Attending Physician:  Ardis Hughs, MD  Chief Complaint: Right flank pain  Subjective: 56 year old female with history of HTN, HLD, DM presenting with right abdominal and flank pain. It began 3 days ago and became acutely worse today. She has had nausea and vomiting. No fevers/chills. She has previously seen Dr. Matilde Sprang for hematuria. Had a negative cystoscopy but CT revealed a 1.53mm right renal stone. She has never had flank pain from a kidney stone before. She is not accompanied by any family at this time.   Objective   Vital signs in last 24 hours: BP (!) 191/69   Pulse 66   Temp 98 F (36.7 C)   Resp 18   Ht 5\' 2"  (1.575 m)   Wt 104.3 kg (230 lb)   LMP 06/21/2016   SpO2 100%   BMI 42.07 kg/m   Intake/Output last 3 shifts: I/O last 3 completed shifts: In: -  Out: 350 [Urine:350]  Physical Exam General: NAD, A&O, resting, appropriate HEENT: [place]/AT, EOMI, MMM Pulmonary: Normal work of breathing on RA Cardiovascular: Regular rate & rhythm, HDS, adequate peripheral perfusion Abdomen: obese, soft, right abdomen tender, nondistended GU: no foley, right CVA tenderness Extremities: warm and well perfused, no edema Neuro: Appropriate, no focal neurological deficits  Most Recent Labs: Lab Results  Component Value Date   WBC 9.6 07/24/2016   HGB 13.9 07/24/2016   HCT 41.0 07/24/2016   PLT 263 07/24/2016    Lab Results  Component Value Date   NA 137 07/24/2016   K 3.5 07/24/2016   CL 105 07/24/2016   CO2 27 03/30/2013   BUN 18 07/24/2016   CREATININE 1.70 (H) 07/24/2016   CALCIUM 9.0 03/30/2013    Lab Results  Component Value Date   ALKPHOS 63 05/22/2011   BILITOT 0.2 (L) 05/22/2011   PROT 7.4 05/22/2011   ALBUMIN 3.5 05/22/2011   ALT 16 05/22/2011   AST 26 05/22/2011    Urine Culture: Pending  IMAGING: US Renal  Result Date: 07/24/2016 CLINICAL DATA:  Right flank pain. EXAM: RENAL / URINARY TRACT ULTRASOUND COMPLETE COMPARISON:   07/09/2016 CT abdomen/ pelvis. FINDINGS: Right Kidney: Length: 11.7 cm. Echogenic right kidney. Normal right renal parenchymal thickness. Mild right hydronephrosis, which appears new since 07/09/2016 CT study. Right ureteropelvic junction 1.7 cm shadowing stone. No right renal mass. Left Kidney: Length: 10.4 cm. Echogenicity within normal limits. No mass or hydronephrosis visualized. Bladder: Nearly collapsed bladder, limiting evaluation. No ureteral jets are detected. IMPRESSION: 1. Obstructing 1.7 cm right ureteropelvic junction stone with new mild right hydronephrosis. 2. 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 3. Normal left kidney.  No left hydronephrosis. 4. Nearly collapsed bladder, limiting evaluation. Electronically Signed   By: Ilona Sorrel M.D.   On: 07/24/2016 18:00    Assessment: Patient is a 56 y.o. female with history of HTN, HLD, DM presenting with acute right flank pain secondary to a 25mm right UPJ stone. She has had unrelenting pain and has a creatinine suggestive of AKI. She has not had fevers and is hemodynamically stable.  We discussed the options for management. For her pain and AKI, we recommended placement of a right ureteral stent under general anesthesia. We discussed the inherent risks, benefits, alternatives, and possible complications of this procedure. We discussed the temporary nature of the stent and the post-op course, to include a definitive stone therapy in the future after this hospitalization.   Plan: 1. Admit to  Urology - Louis Meckel as attending. 2. Regular diet, NPO after midnight. IV fluids at 150/hr. 3. Will follow culture, although do not suspect UTI and ED felt it was likely contaminated. 4. Posted for cysto, right ureteral stent placement tomorrow morning.  5. Morphine PCA for pain control 6. Restart home meds, hold home aspirin for now  Discussed with Dr. Louis Meckel.  Lorayne Bender, MD PGY4  Urology Resident

## 2016-07-24 NOTE — ED Notes (Signed)
Helped pt on to bedpan. PT tolerated it well

## 2016-07-24 NOTE — ED Provider Notes (Signed)
[facility] DEPT Provider Note   CSN: [id number] Arrival date & time: 07/24/16  1256     History   Chief Complaint Chief Complaint  Patient presents with  . Flank Pain  . Emesis    HPI Katrina Beard is a 56 y.o. female with PMHx DM and HTN Presents today with chief complaint acute onset, progressively worsening right sided abdominal pain and right flank pain for 3 days. She states symptoms began Thursday evening and were mild, but worsened yesterday. Pain is now sharp and severe in the right lower quadrant intermittently radiating to the back, with associated nausea and multiple episodes of NBNB vomiting. She states that several weeks ago she went to see her primary care for evaluation of abdominal pain, where she underwent an abdominal x-ray and found to have large kidney stones in both ureters. CT in April shows 1.3 cm stone in the central collecting system of the right kidney and 52mm nonobstructing stone on the left. She was referred to urology for further evaluation, at which time they did a cystoscopy and she was treated for a UTI. She endorses hematuria during this time but states after treatment of the UTI this resolved. She has not had any pain like this before. She is not tried anything for her symptoms. Zofran given in triage today helpful. She also endorses lightheadedness and diaphoresis due to pain. Denies fever, headaches, chest pain, shortness of breath, dysuria, melena, diarrhea, constipation. She also denies any vaginal itching, bleeding, discharge.   She states she does not take any medications for her diabetes, and she did not take her blood pressure medication today.   The history is provided by the patient.    Past Medical History:  Diagnosis Date  . Diabetes mellitus   . Hypertension   . 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 Kidney stone 07/24/2016  . Ureteral stone 07/24/2016    Past Surgical History:    Procedure Laterality Date  . BREAST SURGERY    . CHOLECYSTECTOMY      OB History    No data available       Home Medications    Prior to Admission medications   Medication Sig Start Date End Date Taking? Authorizing Provider  aspirin EC 81 MG tablet Take 81 mg by mouth daily.   Yes [provider]  atorvastatin (LIPITOR) 20 MG tablet Take 20 mg by mouth daily. 10/30/15  Yes [provider]  calcium carbonate (OS-CAL - DOSED IN MG OF ELEMENTAL CALCIUM) 1250 (500 Ca) MG tablet Take 3 tablets by mouth.   Yes [provider]  losartan (COZAAR) 100 MG tablet Take 100 mg by mouth daily.   Yes [provider]  Multiple Vitamins-Iron (MULTIVITAMINS WITH IRON) TABS tablet Take 3 tablets by mouth daily.   Yes [provider]  ALPRAZolam (XANAX) 0.25 MG tablet Take 0.125 mg by mouth daily as needed for anxiety. 10/23/15   [provider]  nitrofurantoin, macrocrystal-monohydrate, (MACROBID) 100 MG capsule Take 1 capsule (100 mg total) by mouth 2 (two) times daily. Patient not taking: Reported on 07/24/2016 06/19/16   Barry Dienes, NP    Family History No family history on file.  Social History Social History  Substance Use Topics  . Smoking status: Never Smoker  . Smokeless tobacco: Never Used  . Alcohol use No     Allergies   Ibuprofen and Lisinopril-hydrochlorothiazide  Review of Systems Review of Systems  Constitutional: Positive for chills. Negative for fever.  Respiratory: Negative for shortness of breath.   Cardiovascular: Negative for chest pain.  Gastrointestinal: Positive for abdominal pain, nausea and vomiting. Negative for blood in stool, constipation and diarrhea.  Genitourinary: Positive for flank pain. Negative for dysuria, hematuria, vaginal bleeding, vaginal discharge and vaginal pain.  Neurological: Positive for light-headedness. Negative for syncope and headaches.     Physical Exam Updated Vital Signs BP  (!) 193/68 (BP Location: Left Arm)   Pulse 66   Temp 98 F (36.7 C)   Resp 18   Ht 5\' 2"  (1.575 m)   Wt 103.5 kg   LMP 06/21/2016   SpO2 100%   BMI 41.73 kg/m   Physical Exam  Constitutional: She appears well-developed and well-nourished. No distress.  HENT:  Head: Normocephalic and atraumatic.  Eyes: Conjunctivae are normal. Right eye exhibits no discharge. Left eye exhibits no discharge. No scleral icterus.  Neck: Neck supple. No JVD present. No tracheal deviation present.  Cardiovascular: Normal rate and regular rhythm.   No murmur heard. 2+ radial pulses bilaterally  Pulmonary/Chest: Effort normal and breath sounds normal. No respiratory distress.  Abdominal: Soft. She exhibits no distension. There is tenderness.  Bowel sounds hypoactive, tender to palpation in the right lower quadrant right upper quadrant and laterally in the flank. No tenderness to palpation at McBurney's point, negative psoas, negative Murphy's, negative Rovsing's.   Genitourinary:  Genitourinary Comments: CVA tenderness on the right  Musculoskeletal: She exhibits no edema.  No midline spine TTP, moves extremities spontaneously  Neurological: She is alert.  Fluent speech no facial droop  Skin: Skin is warm and dry. She is not diaphoretic.  Psychiatric: She has a normal mood and affect. Her behavior is normal.  Nursing note and vitals reviewed.    ED Treatments / Results  Labs (all labs ordered are listed, but only abnormal results are displayed) Labs Reviewed  URINALYSIS, ROUTINE W REFLEX MICROSCOPIC - Abnormal; Notable for the following:       Result Value   APPearance HAZY (*)    Ketones, ur 20 (*)    Protein, ur 30 (*)    Leukocytes, UA TRACE (*)    Bacteria, UA FEW (*)    Squamous Epithelial / LPF 6-30 (*)    All other components within normal limits  I-STAT CHEM 8, ED - Abnormal; Notable for the following:    Creatinine, Ser 1.70 (*)    Glucose, Bld 115 (*)    Calcium, Ion 1.08 (*)     All other components within normal limits  CBG MONITORING, ED - Abnormal; Notable for the following:    Glucose-Capillary 119 (*)    All other components within normal limits  URINE CULTURE  CBC  URINALYSIS, ROUTINE W REFLEX MICROSCOPIC  HIV ANTIBODY (ROUTINE TESTING)  I-STAT BETA HCG BLOOD, ED ([facility], [facility], [facility] ONLY)    EKG  EKG Interpretation None       Radiology US Renal  Result Date: 07/24/2016 CLINICAL DATA:  Right flank pain. EXAM: RENAL / URINARY TRACT ULTRASOUND COMPLETE COMPARISON:  07/09/2016 CT abdomen/ pelvis. FINDINGS: Right Kidney: Length: 11.7 cm. Echogenic right kidney. Normal right renal parenchymal thickness. Mild right hydronephrosis, which appears new since 07/09/2016 CT study. Right ureteropelvic junction 1.7 cm shadowing stone. No right renal mass. Left Kidney: Length: 10.4 cm. Echogenicity within normal limits. No mass or hydronephrosis visualized. Bladder: Nearly collapsed bladder, limiting evaluation. No ureteral jets are detected.  IMPRESSION: 1. Obstructing 1.7 cm right ureteropelvic junction stone with new mild right hydronephrosis. 2. 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 3. Normal left kidney.  No left hydronephrosis. 4. Nearly collapsed bladder, limiting evaluation. Electronically Signed   By: Ilona Sorrel M.D.   On: 07/24/2016 18:00    Procedures Procedures (including critical care time)  Medications Ordered in ED Medications  ondansetron (ZOFRAN-ODT) 4 MG disintegrating tablet (not administered)  HYDROmorphone (DILAUDID) injection 0.5 mg (0.5 mg Intravenous Given 07/24/16 1944)  0.9 %  sodium chloride infusion ( Intravenous New Bag/Given 07/24/16 2050)  ALPRAZolam (XANAX) tablet 0.125 mg (not administered)  atorvastatin (LIPITOR) tablet 20 mg (20 mg Oral Given 07/24/16 2233)  losartan (COZAAR) tablet 100 mg (100 mg Oral Given 07/24/16 2233)  acetaminophen (TYLENOL) tablet 650 mg (not  administered)  ondansetron (ZOFRAN) injection 4 mg (4 mg Intravenous Given 07/24/16 2239)  naloxone [facility]) injection 0.4 mg (not administered)    And  sodium chloride flush (NS) 0.9 % injection 9 mL (not administered)  diphenhydrAMINE (BENADRYL) injection 12.5 mg (not administered)    Or  diphenhydrAMINE (BENADRYL) 12.5 MG/5ML elixir 12.5 mg (not administered)  morphine 2 mg/mL PCA injection ( Intravenous Set-up / Initial Syringe 07/24/16 2221)  promethazine (PHENERGAN) injection 25 mg (25 mg Intravenous Given 07/24/16 2252)  ondansetron (ZOFRAN-ODT) disintegrating tablet 4 mg (4 mg Oral Given 07/24/16 1336)  sodium chloride 0.9 % bolus 1,000 mL (0 mLs Intravenous Stopped 07/24/16 1931)  morphine 4 MG/ML injection 4 mg (4 mg Intravenous Given 07/24/16 1613)  ondansetron (ZOFRAN) injection 4 mg (4 mg Intravenous Given 07/24/16 1621)  ondansetron (ZOFRAN) injection 4 mg (4 mg Intravenous Given by Other 07/24/16 2030)  ondansetron (ZOFRAN) 4 MG/2ML injection (4 mg  Given 07/24/16 2022)     Initial Impression / Assessment and Plan / ED Course  I have reviewed the triage vital signs and the nursing notes.  Pertinent labs & imaging results that were available during my care of the patient were reviewed by me and considered in my medical decision making (see chart for details).     Patient with worsening right abdominal and right flank pain for 3 days.  Afebrile, hypertensive and states she did not take her blood pressure medication today.  UA not entirely consistent with UTI,  Sent for culture.  Acutely worsening creatinine seen on I-STAT Chem-8. Found to have barium in her colon and bladder, unable to obtain CT as a result.  Renal ultrasound shows 1.7 cm obstructing stone at the UPJ with new mild right hydronephrosis.  No evidence of pyelonephritis.  Pain control and  IV fluids given in ED.  Dr. Maryan Rued seen and evaluated patient, consulted Urology who recommended patient be transferred to [facility]  for further evaluation and management under their service.   Final Clinical Impressions(s) / ED Diagnoses   Final diagnoses:  Kidney stone    New Prescriptions Current Discharge Medication List       Debroah Baller 07/24/16 2309    Blanchie Dessert, MD 07/25/16 2233

## 2016-07-25 ENCOUNTER — Observation Stay (HOSPITAL_COMMUNITY): Payer: BC Managed Care – PPO | Admitting: Registered Nurse

## 2016-07-25 ENCOUNTER — Encounter (HOSPITAL_COMMUNITY)
Admission: EM | Disposition: A | Discharge status: Home / Self Care | Payer: Self-pay | Source: Home / Self Care | Attending: Urology

## 2016-07-25 ENCOUNTER — Observation Stay (HOSPITAL_COMMUNITY): Payer: BC Managed Care – PPO

## 2016-07-25 HISTORY — PX: CYSTOSCOPY W/ URETERAL STENT PLACEMENT: SHX1429

## 2016-07-25 LAB — GLUCOSE, CAPILLARY: Glucose-Capillary: 115 mg/dL — ABNORMAL HIGH (ref 65–99)

## 2016-07-25 LAB — SURGICAL PCR SCREEN
MRSA, PCR: NEGATIVE
Staphylococcus aureus: NEGATIVE

## 2016-07-25 LAB — URINE CULTURE

## 2016-07-25 LAB — HIV ANTIBODY (ROUTINE TESTING W REFLEX): HIV Screen 4th Generation wRfx: NONREACTIVE

## 2016-07-25 SURGERY — CYSTOSCOPY, WITH RETROGRADE PYELOGRAM AND URETERAL STENT INSERTION
Anesthesia: General | Site: Ureter | Laterality: Right

## 2016-07-25 MED ORDER — ONDANSETRON HCL 4 MG/2ML IJ SOLN
INTRAMUSCULAR | Status: DC | PRN
  Administered 2016-07-25 (×2): 4 mg via INTRAVENOUS

## 2016-07-25 MED ORDER — ACETAMINOPHEN 10 MG/ML IV SOLN
INTRAVENOUS | Status: DC | PRN
  Administered 2016-07-25: 1000 mg via INTRAVENOUS

## 2016-07-25 MED ORDER — ONDANSETRON HCL 4 MG/2ML IJ SOLN
INTRAMUSCULAR | Status: AC
  Filled 2016-07-25: qty 2

## 2016-07-25 MED ORDER — CEFAZOLIN SODIUM-DEXTROSE 2-4 GM/100ML-% IV SOLN
INTRAVENOUS | Status: AC
  Filled 2016-07-25: qty 100

## 2016-07-25 MED ORDER — PROPOFOL 10 MG/ML IV BOLUS
INTRAVENOUS | Status: AC
  Filled 2016-07-25: qty 40

## 2016-07-25 MED ORDER — SCOPOLAMINE 1 MG/3DAYS TD PT72
MEDICATED_PATCH | TRANSDERMAL | Status: AC
  Filled 2016-07-25: qty 1

## 2016-07-25 MED ORDER — SCOPOLAMINE 1 MG/3DAYS TD PT72
MEDICATED_PATCH | TRANSDERMAL | Status: DC | PRN
  Administered 2016-07-25: 1 via TRANSDERMAL

## 2016-07-25 MED ORDER — LACTATED RINGERS IV SOLN
INTRAVENOUS | Status: DC | PRN
  Administered 2016-07-25: 07:00:00 via INTRAVENOUS

## 2016-07-25 MED ORDER — DEXAMETHASONE SODIUM PHOSPHATE 10 MG/ML IJ SOLN
INTRAMUSCULAR | Status: AC
  Filled 2016-07-25: qty 1

## 2016-07-25 MED ORDER — DEXAMETHASONE SODIUM PHOSPHATE 10 MG/ML IJ SOLN
INTRAMUSCULAR | Status: DC | PRN
  Administered 2016-07-25: 10 mg via INTRAVENOUS

## 2016-07-25 MED ORDER — SODIUM CHLORIDE 0.9 % IR SOLN
Status: DC | PRN
  Administered 2016-07-25: 3000 mL

## 2016-07-25 MED ORDER — FENTANYL CITRATE (PF) 250 MCG/5ML IJ SOLN
INTRAMUSCULAR | Status: AC
  Filled 2016-07-25: qty 5

## 2016-07-25 MED ORDER — SODIUM CHLORIDE 0.9 % IV SOLN
INTRAVENOUS | Status: DC | PRN
  Administered 2016-07-25: 7 mL

## 2016-07-25 MED ORDER — FENTANYL CITRATE (PF) 100 MCG/2ML IJ SOLN: 25.0000 ug | INTRAMUSCULAR | Status: DC | PRN

## 2016-07-25 MED ORDER — LIDOCAINE 2% (20 MG/ML) 5 ML SYRINGE
INTRAMUSCULAR | Status: AC
  Filled 2016-07-25: qty 5

## 2016-07-25 MED ORDER — FENTANYL CITRATE (PF) 100 MCG/2ML IJ SOLN
INTRAMUSCULAR | Status: DC | PRN
  Administered 2016-07-25: 100 ug via INTRAVENOUS
  Administered 2016-07-25: 50 ug via INTRAVENOUS

## 2016-07-25 MED ORDER — LABETALOL HCL 5 MG/ML IV SOLN
INTRAVENOUS | Status: DC | PRN
  Administered 2016-07-25: 5 mg via INTRAVENOUS

## 2016-07-25 MED ORDER — ACETAMINOPHEN 10 MG/ML IV SOLN
INTRAVENOUS | Status: AC
  Filled 2016-07-25: qty 100

## 2016-07-25 MED ORDER — LIDOCAINE HCL (CARDIAC) 10 MG/ML IV SOLN
INTRAVENOUS | Status: DC | PRN
Start: 2016-07-25 — End: 2016-07-25
  Administered 2016-07-25: 75 mg via INTRAVENOUS
  Administered 2016-07-25: 25 mg via INTRAVENOUS

## 2016-07-25 MED ORDER — PHENAZOPYRIDINE HCL 200 MG PO TABS: 200.0000 mg | ORAL_TABLET | Freq: Three times a day (TID) | ORAL | 0 refills | Status: AC | PRN

## 2016-07-25 MED ORDER — PROMETHAZINE HCL 25 MG/ML IJ SOLN: 6.2500 mg | INTRAMUSCULAR | Status: DC | PRN

## 2016-07-25 MED ORDER — CEFAZOLIN SODIUM-DEXTROSE 2-3 GM-% IV SOLR
INTRAVENOUS | Status: DC | PRN
  Administered 2016-07-25: 2 g via INTRAVENOUS

## 2016-07-25 MED ORDER — LABETALOL HCL 5 MG/ML IV SOLN
INTRAVENOUS | Status: AC
  Filled 2016-07-25: qty 4

## 2016-07-25 MED ORDER — TRAMADOL HCL 50 MG PO TABS: 50.0000 mg | ORAL_TABLET | Freq: Four times a day (QID) | ORAL | 0 refills | Status: DC | PRN

## 2016-07-25 MED ORDER — FENTANYL CITRATE (PF) 100 MCG/2ML IJ SOLN
INTRAMUSCULAR | Status: AC
  Filled 2016-07-25: qty 2

## 2016-07-25 MED ORDER — SUCCINYLCHOLINE CHLORIDE 200 MG/10ML IV SOSY
PREFILLED_SYRINGE | INTRAVENOUS | Status: AC
  Filled 2016-07-25: qty 10

## 2016-07-25 MED ORDER — MIDAZOLAM HCL 2 MG/2ML IJ SOLN
INTRAMUSCULAR | Status: AC
  Filled 2016-07-25: qty 2

## 2016-07-25 MED ORDER — 0.9 % SODIUM CHLORIDE (POUR BTL) OPTIME
TOPICAL | Status: DC | PRN
  Administered 2016-07-25: 1000 mL

## 2016-07-25 SURGICAL SUPPLY — 18 items
BAG URO CATCHER STRL LF (MISCELLANEOUS) ×2 IMPLANT
BASKET DAKOTA 1.9FR 11X120 (BASKET) IMPLANT
BASKET ZERO TIP NITINOL 2.4FR (BASKET) IMPLANT
CATH URET 5FR 28IN OPEN ENDED (CATHETERS) ×2 IMPLANT
CLOTH BEACON ORANGE TIMEOUT ST (SAFETY) ×2 IMPLANT
COVER SURGICAL LIGHT HANDLE (MISCELLANEOUS) ×2 IMPLANT
GLOVE BIOGEL M STRL SZ7.5 (GLOVE) ×2 IMPLANT
GOWN STRL REUS W/TWL XL LVL3 (GOWN DISPOSABLE) ×2 IMPLANT
GUIDEWIRE ANG ZIPWIRE 038X150 (WIRE) IMPLANT
GUIDEWIRE STR DUAL SENSOR (WIRE) ×2 IMPLANT
MANIFOLD NEPTUNE II (INSTRUMENTS) ×2 IMPLANT
PACK CYSTO (CUSTOM PROCEDURE TRAY) ×2 IMPLANT
SHEATH ACCESS URETERAL 24CM (SHEATH) IMPLANT
SHEATH ACCESS URETERAL 38CM (SHEATH) IMPLANT
SHEATH ACCESS URETERAL 54CM (SHEATH) IMPLANT
STENT URET 6FRX24 CONTOUR (STENTS) ×2 IMPLANT
TUBING CONNECTING 10 (TUBING) ×2 IMPLANT
WIRE COONS/BENSON .038X145CM (WIRE) IMPLANT

## 2016-07-25 NOTE — Transfer of Care (Signed)
Immediate Anesthesia Transfer of Care Note  Patient: Katrina Beard  Procedure(s) Performed: Procedure(s): CYSTOSCOPY WITH RETROGRADE PYELOGRAM/LEFT URETERAL STENT PLACEMENT (Right)  Patient Location: PACU  Anesthesia Type:General  Level of Consciousness: awake, alert , oriented and patient cooperative  Airway & Oxygen Therapy: Patient Spontanous Breathing and Patient connected to face mask oxygen  Post-op Assessment: Report given to RN, Post -op Vital signs reviewed and stable and Patient moving all extremities X 4  Post vital signs: stable  Last Vitals:  Vitals:   07/25/16 0455 07/25/16 0817  BP: (!) 168/79 (!) 165/79  Pulse: 65   Resp: 19   Temp: 36.8 C 36.4 C    Last Pain:  Vitals:   07/25/16 0817  TempSrc:   PainSc: Asleep      Patients Stated Pain Goal: 1 (54/36/06 7703)  Complications: No apparent anesthesia complications

## 2016-07-25 NOTE — Discharge Instructions (Signed)
Take Tylenol/Ibuprofen for pain. Take Tramadol for breakthrough pain. Pyridium will help with burning with urination and bladder spasms. It will turn your urine orange, this is normal.   [facility] for fever >101.10F by mouth, uncontrolled nausea or vomiting, pain uncontrolled by medication, decreased urine output, signs of wound infection (rapidly spreading redness/swelling, purulent discharge, increasing bleeding from wounds, or separation of wound); also call for any new or concerning symptoms.   Do not drive while taking narcotic pain medications. Take Colace and/or Senna while taking narcotic pain medication. You may take over-the-counter Laxatives such as Miralax, Senna, or Milk of Magnesia. Stop taking these medications if you develop diarrhea.  Take all medications as prescribed.  Call the clinic to confirm your follow-up appointment if not already made.

## 2016-07-25 NOTE — Anesthesia Preprocedure Evaluation (Signed)
Anesthesia Evaluation  Patient identified by MRN, date of birth, ID band Patient awake    Reviewed: Allergy & Precautions, NPO status , Patient's Chart, lab work & pertinent test results  Airway Mallampati: II  TM Distance: >3 FB Neck ROM: Full    Dental  (+) Dental Advisory Given   Pulmonary neg pulmonary ROS,    breath sounds clear to auscultation       Cardiovascular hypertension, Pt. on medications + Past MI   Rhythm:Regular Rate:Normal     Neuro/Psych negative neurological ROS     GI/Hepatic negative GI ROS, Neg liver ROS,   Endo/Other  diabetesMorbid obesity  Renal/GU Renal disease     Musculoskeletal   Abdominal   Peds  Hematology negative hematology ROS (+)   Anesthesia Other Findings   Reproductive/Obstetrics                             Lab Results  Component Value Date   WBC 9.6 07/24/2016   HGB 13.9 07/24/2016   HCT 41.0 07/24/2016   MCV 84.9 07/24/2016   PLT 263 07/24/2016   Lab Results  Component Value Date   CREATININE 1.70 (H) 07/24/2016   BUN 18 07/24/2016   NA 137 07/24/2016   K 3.5 07/24/2016   CL 105 07/24/2016   CO2 27 03/30/2013    Anesthesia Physical Anesthesia Plan  ASA: III  Anesthesia Plan: General   Post-op Pain Management:    Induction: Intravenous  Airway Management Planned: Oral ETT  Additional Equipment:   Intra-op Plan:   Post-operative Plan: Extubation in OR  Informed Consent: I have reviewed the patients History and Physical, chart, labs and discussed the procedure including the risks, benefits and alternatives for the proposed anesthesia with the patient or authorized representative who has indicated his/her understanding and acceptance.   Dental advisory given  Plan Discussed with: CRNA  Anesthesia Plan Comments:         Anesthesia Quick Evaluation

## 2016-07-25 NOTE — Anesthesia Postprocedure Evaluation (Signed)
Anesthesia Post Note  Patient: Katrina Beard  Procedure(s) Performed: Procedure(s) (LRB): CYSTOSCOPY WITH RETROGRADE PYELOGRAM/LEFT URETERAL STENT PLACEMENT (Right)  Patient location during evaluation: PACU Anesthesia Type: General Level of consciousness: awake and alert Pain management: pain level controlled Vital Signs Assessment: post-procedure vital signs reviewed and stable Respiratory status: spontaneous breathing, nonlabored ventilation, respiratory function stable and patient connected to nasal cannula oxygen Cardiovascular status: blood pressure returned to baseline and stable Postop Assessment: no signs of nausea or vomiting Anesthetic complications: no       Last Vitals:  Vitals:   07/25/16 0817 07/25/16 0845  BP: (!) 165/79   Pulse: 80   Resp: 16 12  Temp: 36.4 C 36.8 C    Last Pain:  Vitals:   07/25/16 0817  TempSrc:   PainSc: Tyler Deis

## 2016-07-25 NOTE — Op Note (Signed)
Preoperative diagnosis:  1. Right ureteral stone   Postoperative diagnosis:  1. same   Procedure:  1. Cystoscopy 2. right ureteral stent placement - 24cm x 17F 3. right retrograde pyelography with interpretation   Surgeon: Ardis Hughs, MD  Anesthesia: General  Complications: None  Intraoperative findings:  right retrograde pyelography demonstrated a filling defect within the right proximal ureter consistent with the patient's known calculus without other abnormalities.  EBL: Minimal  Specimens: None  Indication: Katrina Beard is a 56 y.o. patient with obstructing right ureteral stone. After reviewing the management options for treatment, he elected to proceed with the above surgical procedure(s). We have discussed the potential benefits and risks of the procedure, side effects of the proposed treatment, the likelihood of the patient achieving the goals of the procedure, and any potential problems that might occur during the procedure or recuperation. Informed consent has been obtained.  Description of procedure:  The patient was taken to the operating room and general anesthesia was induced.  The patient was placed in the dorsal lithotomy position, prepped and draped in the usual sterile fashion, and preoperative antibiotics were administered. A preoperative time-out was performed.   Cystourethroscopy was performed.  The patient's urethra was examined and was normal. The bladder was then systematically examined in its entirety. There was no evidence for any bladder tumors, stones, or other mucosal pathology.    Attention then turned to the rightureteral orifice and a ureteral catheter was used to intubate the ureteral orifice.  Omnipaque contrast was injected through the ureteral catheter and a retrograde pyelogram was performed with findings as dictated above.  A 0.38 sensor guidewire was then advanced up the right ureter into the renal pelvis under fluoroscopic guidance.  The  wire was then backloaded through the cystoscope and a ureteral stent was advance over the wire using Seldinger technique.  The stent was positioned appropriately under fluoroscopic and cystoscopic guidance.  The wire was then removed with an adequate stent curl noted in the renal pelvis as well as in the bladder.  The bladder was then emptied and the procedure ended.  The patient appeared to tolerate the procedure well and without complications.  The patient was able to be awakened and transferred to the recovery unit in satisfactory condition.    Ardis Hughs, M.D.

## 2016-07-25 NOTE — Discharge Summary (Signed)
Date of admission: 07/24/2016  Date of discharge: 07/25/2016  Admission diagnosis: Right ureteral stone, recurrent pain, AKI  Discharge diagnosis: Same  History and Physical: For full details, please see admission history and physical. Briefly, Nedda Gains is a 56 y.o. female with a 46m right UPJ stone with recurrent pain and AKI. Please see admission H&P for further details.   Hospital Course: [facility] She was given IV fluids and pain control She was taken to the operating room on 07/25/2016 and underwent a right ureteral stent placement. She tolerated this procedure well and without complications. Postoperatively, the patient was able to be transferred to a regular hospital room following recovery from anesthesia. She met all discharge criteria and was able to be discharged home on POD#0 / HD#1.  Laboratory values:   Recent Labs  07/24/16 1309 07/24/16 1323  HGB 12.6 13.9  HCT 38.2 41.0    Disposition: Home  Discharge instruction: They were instructed to be ambulatory but to refrain from heavy lifting, strenuous activity, or driving.  Discharge medications:   Allergies as of 07/25/2016      Reactions   Ibuprofen Other (See Comments)   Gastric bypass surgery-can't take anymore   Lisinopril-hydrochlorothiazide Other (See Comments)   Arm numbness and tongue blisters      Medication List    TAKE these medications   ALPRAZolam 0.25 MG tablet Commonly known as:  XANAX Take 0.125 mg by mouth daily as needed for anxiety.   aspirin EC 81 MG tablet Take 81 mg by mouth daily.   atorvastatin 20 MG tablet Commonly known as:  LIPITOR Take 20 mg by mouth daily.   calcium carbonate 1250 (500 Ca) MG tablet Commonly known as:  OS-CAL - dosed in mg of elemental calcium Take 3 tablets by mouth.   losartan 100 MG tablet Commonly known as:  COZAAR Take 100 mg by mouth daily.   multivitamins with iron Tabs tablet Take 3 tablets  by mouth daily.   nitrofurantoin (macrocrystal-monohydrate) 100 MG capsule Commonly known as:  MACROBID Take 1 capsule (100 mg total) by mouth 2 (two) times daily.   phenazopyridine 200 MG tablet Commonly known as:  PYRIDIUM Take 1 tablet (200 mg total) by mouth 3 (three) times daily as needed for pain.   traMADol 50 MG tablet Commonly known as:  ULTRAM Take 1-2 tablets (50-100 mg total) by mouth every 6 (six) hours as needed (pain).       Followup: She will follow-up in near future for definitive management of her stone.

## 2016-07-25 NOTE — Anesthesia Procedure Notes (Signed)
Procedure Name: Intubation Date/Time: 07/25/2016 7:47 AM Performed by: Lissa Morales Pre-anesthesia Checklist: Patient identified, Emergency Drugs available, Suction available and Patient being monitored Patient Re-evaluated:Patient Re-evaluated prior to inductionOxygen Delivery Method: Circle system utilized Preoxygenation: Pre-oxygenation with 100% oxygen Intubation Type: IV induction Ventilation: Mask ventilation without difficulty Laryngoscope Size: Mac and 4 Grade View: Grade I Tube type: Oral Tube size: 7.0 mm Number of attempts: 1 Airway Equipment and Method: Stylet and Oral airway Placement Confirmation: ETT inserted through vocal cords under direct vision,  positive ETCO2 and breath sounds checked- equal and bilateral Secured at: 21 cm Tube secured with: Tape Dental Injury: Teeth and Oropharynx as per pre-operative assessment

## 2016-07-26 ENCOUNTER — Encounter (HOSPITAL_COMMUNITY): Payer: Self-pay | Admitting: Urology

## 2016-07-26 LAB — GLUCOSE, CAPILLARY: Glucose-Capillary: 95 mg/dL (ref 65–99)

## 2016-11-17 ENCOUNTER — Other Ambulatory Visit: Payer: Self-pay | Admitting: Family Medicine

## 2016-11-17 ENCOUNTER — Other Ambulatory Visit (HOSPITAL_COMMUNITY)
Admission: RE | Admit: 2016-11-17 | Discharge: 2016-11-17 | Disposition: A | Discharge status: Home / Self Care | Payer: BC Managed Care – PPO | Source: Ambulatory Visit | Attending: Family Medicine | Admitting: Family Medicine

## 2016-11-17 DIAGNOSIS — Z01411 Encounter for gynecological examination (general) (routine) with abnormal findings: Secondary | ICD-10-CM | POA: Diagnosis present

## 2016-11-18 LAB — CYTOLOGY - PAP: Diagnosis: NEGATIVE

## 2016-12-07 ENCOUNTER — Other Ambulatory Visit: Payer: Self-pay | Admitting: Family Medicine

## 2016-12-07 DIAGNOSIS — Z1231 Encounter for screening mammogram for malignant neoplasm of breast: Secondary | ICD-10-CM

## 2016-12-28 ENCOUNTER — Ambulatory Visit
Admission: RE | Admit: 2016-12-28 | Discharge: 2016-12-28 | Disposition: A | Discharge status: Home / Self Care | Payer: BC Managed Care – PPO | Source: Ambulatory Visit | Attending: Family Medicine | Admitting: Family Medicine

## 2016-12-28 DIAGNOSIS — Z1231 Encounter for screening mammogram for malignant neoplasm of breast: Secondary | ICD-10-CM

## 2017-12-07 ENCOUNTER — Other Ambulatory Visit: Payer: Self-pay | Admitting: Family Medicine

## 2017-12-07 DIAGNOSIS — Z1231 Encounter for screening mammogram for malignant neoplasm of breast: Secondary | ICD-10-CM

## 2018-01-05 ENCOUNTER — Ambulatory Visit
Admission: RE | Admit: 2018-01-05 | Discharge: 2018-01-05 | Disposition: A | Discharge status: Home / Self Care | Payer: BC Managed Care – PPO | Source: Ambulatory Visit | Attending: Family Medicine | Admitting: Family Medicine

## 2018-01-05 ENCOUNTER — Other Ambulatory Visit: Payer: Self-pay | Admitting: Family Medicine

## 2018-01-05 DIAGNOSIS — M25532 Pain in left wrist: Secondary | ICD-10-CM

## 2018-01-09 ENCOUNTER — Ambulatory Visit
Admission: RE | Admit: 2018-01-09 | Discharge: 2018-01-09 | Disposition: A | Discharge status: Home / Self Care | Payer: BC Managed Care – PPO | Source: Ambulatory Visit | Attending: Family Medicine | Admitting: Family Medicine

## 2018-01-09 DIAGNOSIS — Z1231 Encounter for screening mammogram for malignant neoplasm of breast: Secondary | ICD-10-CM

## 2018-01-24 DIAGNOSIS — G8929 Other chronic pain: Secondary | ICD-10-CM | POA: Insufficient documentation

## 2018-06-08 DIAGNOSIS — M654 Radial styloid tenosynovitis [de Quervain]: Secondary | ICD-10-CM | POA: Insufficient documentation

## 2018-08-08 IMAGING — CT CT ABD-PEL WO/W CM
2 of 6 series · 13 of 32 positions shown, 18 images · IV contrast (APPLIED)
Comparison: None.

CLINICAL DATA: Gross hematuria. Bilateral flank and mid abdominal
pain.

EXAM:
CT ABDOMEN AND PELVIS WITHOUT AND WITH CONTRAST
TECHNIQUE: Multidetector CT imaging of the abdomen and pelvis was performed
following the standard protocol before and following the bolus
administration of intravenous contrast.
CONTRAST:  125mL OHIF61-044 IOPAMIDOL (OHIF61-044) INJECTION 61%

[Series 2: abd/pelvis w/out-supine · axial · 0.95mm/px · z∈[-422,-72]mm · 7 of 94 slices shown, 12 images]
[im 12/94  soft-tissue]
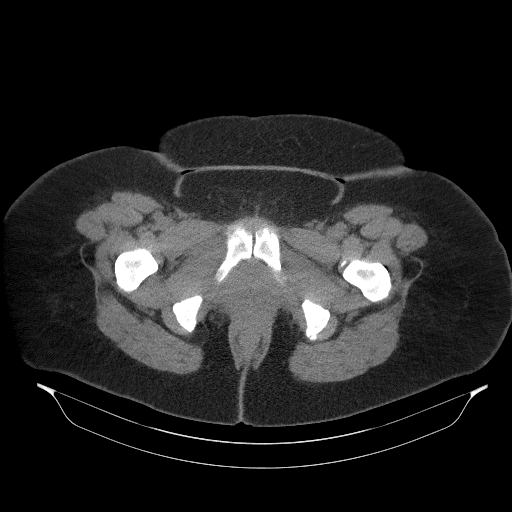
[im 12/94  bone]
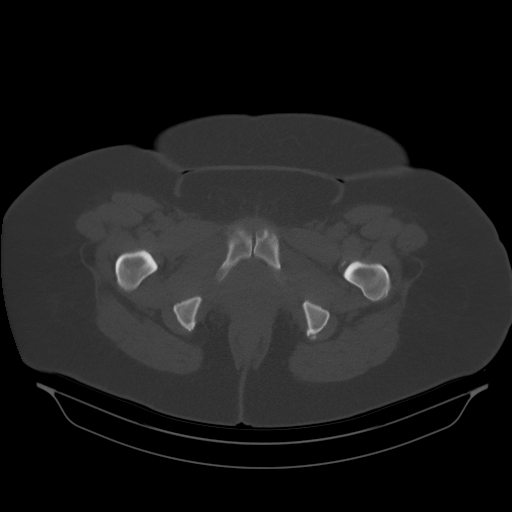
[im 24/94  soft-tissue]
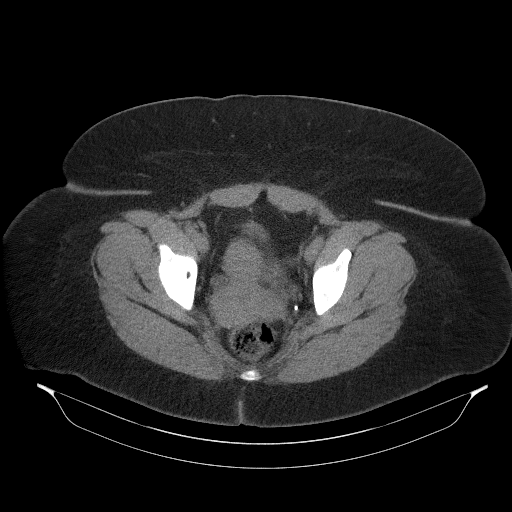
[im 35/94  soft-tissue]
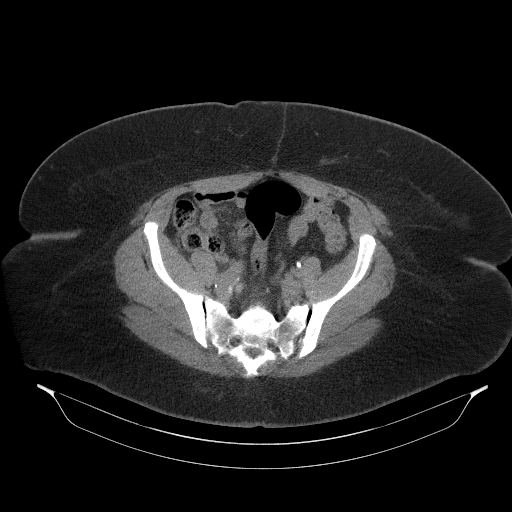
[im 47/94  soft-tissue]
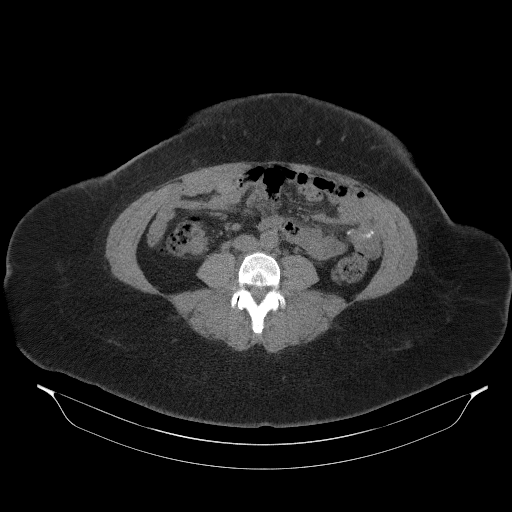
[im 47/94  lung]
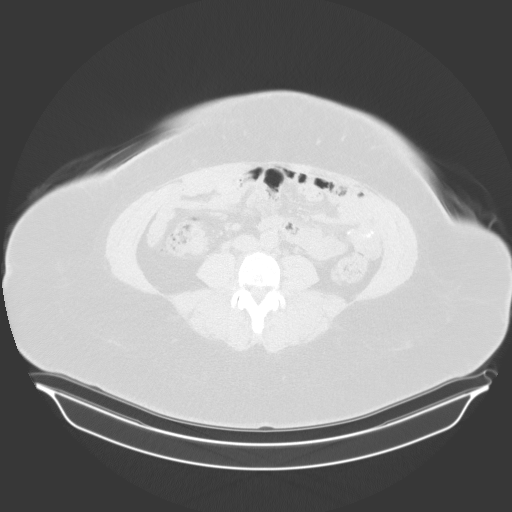
[im 59/94  soft-tissue]
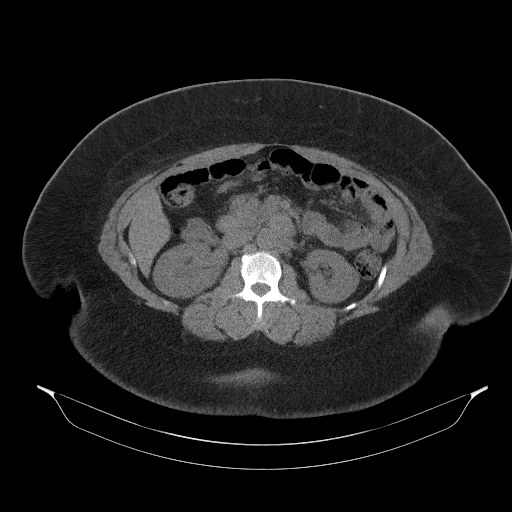
[im 59/94  lung]
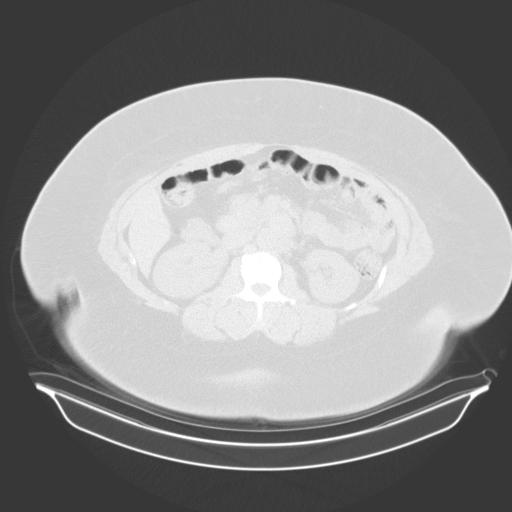
[im 70/94  soft-tissue]
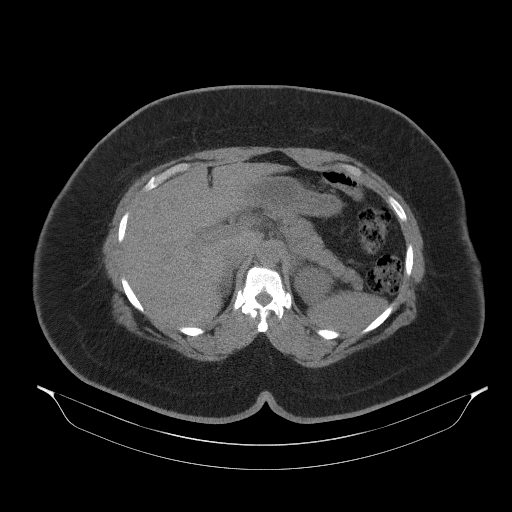
[im 70/94  lung]
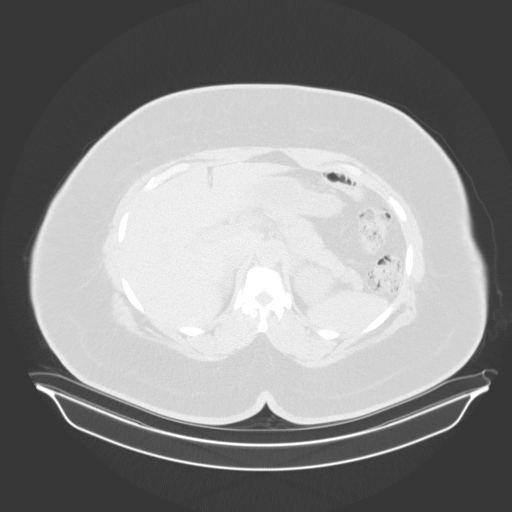
[im 82/94  soft-tissue]
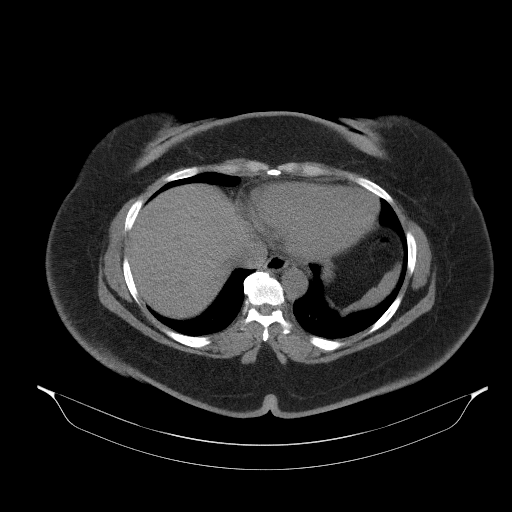
[im 82/94  lung]
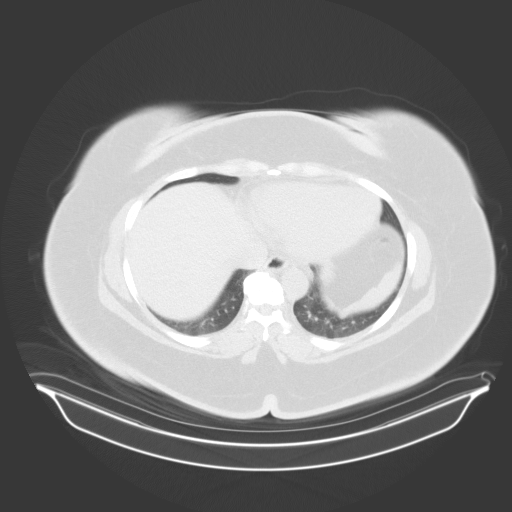

[Series 3: abd/pelvis w/cm-supine · axial · 0.95mm/px · z∈[-412,-82]mm · 6 of 94 slices shown]
[im 14/94  soft-tissue]
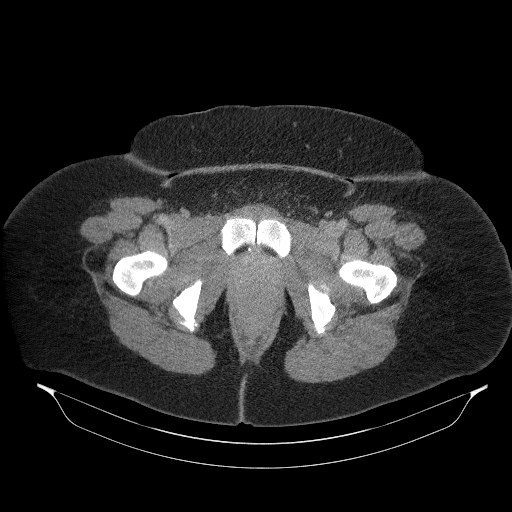
[im 27/94  soft-tissue]
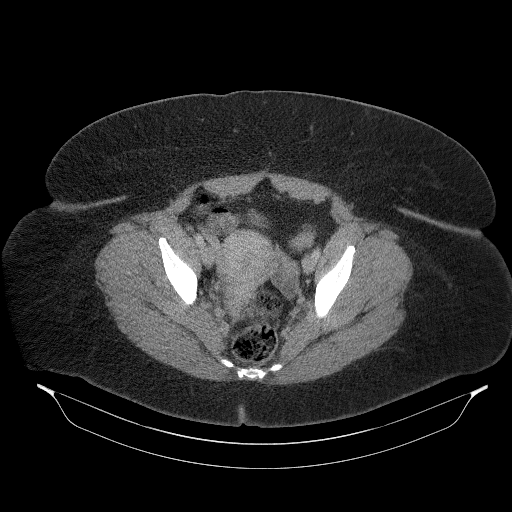
[im 40/94  soft-tissue]
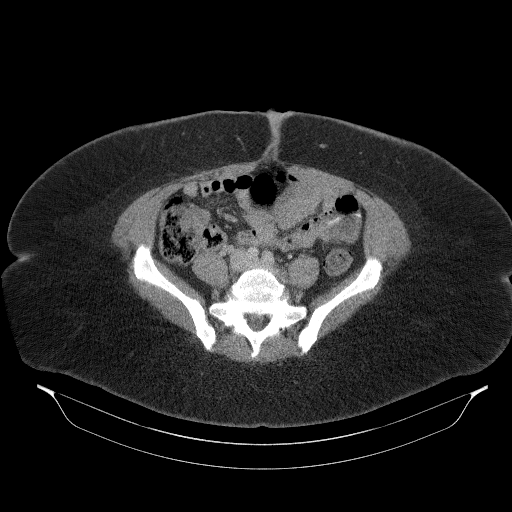
[im 54/94  soft-tissue]
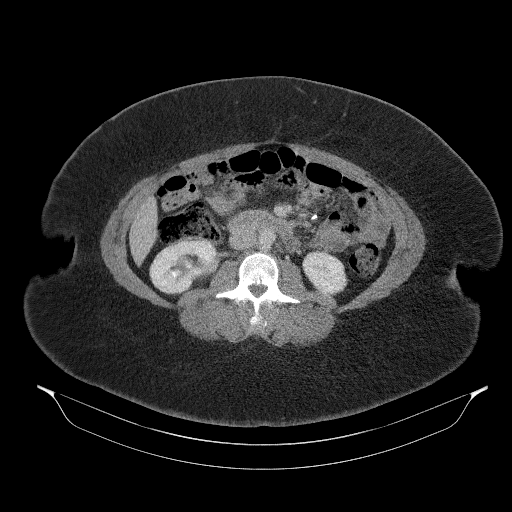
[im 67/94  soft-tissue]
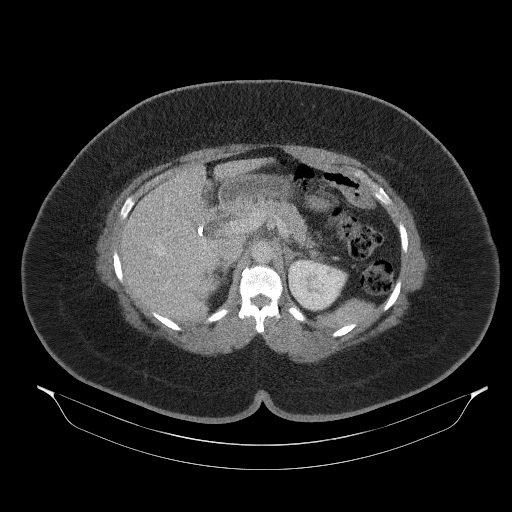
[im 80/94  soft-tissue]
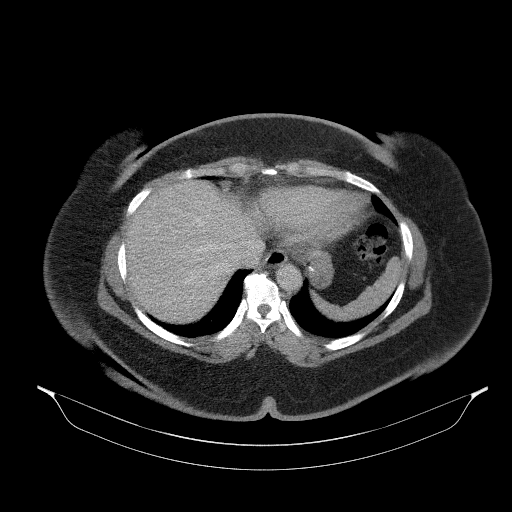

[13 of 32 positions shown; findings below may reference images not displayed]

FINDINGS: Lower chest:  Unremarkable.

Hepatobiliary: No focal abnormality within the liver parenchyma.
Gallbladder surgically absent. No intrahepatic or extrahepatic
biliary dilation.

Pancreas: No focal mass lesion. No dilatation of the main duct. No
intraparenchymal cyst. No peripancreatic edema.

Spleen: No splenomegaly. No focal mass lesion.

Adrenals/Urinary Tract: No adrenal nodule or mass. 1.3 x 0.9 x
cm stone identified in the central collecting system of the right
kidney with mild fullness of the upper and lower pole calices. No
right ureteral stone. Punctate 1 mm interpolar left renal stone is
best seen on coronal image 86 of series 6. No left ureteral stone.
No left hydroureteronephrosis. No bladder stone.

Imaging after IV contrast administration shows no enhancing lesion
in either kidney. Renal perfusion appears symmetric. Delayed
post-contrast imaging again shows fullness of the right intrarenal
collecting system. Left intrarenal collecting system is
unremarkable. Both ureters are well opacified without ureteral wall
thickening, focal hydroureter, or associated mass lesion. No focal
bladder wall abnormality is evident.

Stomach/Bowel: Postsurgical changes in the stomach compatible with
gastric bypass. Duodenum is normally positioned as is the ligament
of Treitz. No small bowel wall thickening. No small bowel
dilatation. The terminal ileum is normal. The appendix is not
visualized, but there is no edema or inflammation in the region of
the cecum. No gross colonic mass. No colonic wall thickening. No
substantial diverticular change.

Vascular/Lymphatic: No abdominal aortic aneurysm. No abdominal
aortic atherosclerotic calcification. There is no gastrohepatic or
hepatoduodenal ligament lymphadenopathy. No intraperitoneal or
retroperitoneal lymphadenopathy. No pelvic sidewall lymphadenopathy.

Reproductive: The uterus has normal CT imaging appearance. There is
no adnexal mass.

Other: No intraperitoneal free fluid.

Musculoskeletal: Bone windows reveal no worrisome lytic or sclerotic
osseous lesions.
IMPRESSION: 1. 1.3 cm stone in the central collecting system of the right kidney
has is mild fullness of upper and lower pole calices.
2. 1 mm nonobstructing stone identified interpolar left kidney.
3. No worrisome or suspicious lesion identified in either kidney,
the ureters, or bladder by CT imaging.

## 2018-12-11 ENCOUNTER — Other Ambulatory Visit: Payer: Self-pay | Admitting: Family Medicine

## 2018-12-11 DIAGNOSIS — Z1231 Encounter for screening mammogram for malignant neoplasm of breast: Secondary | ICD-10-CM

## 2019-01-16 ENCOUNTER — Other Ambulatory Visit: Payer: Self-pay

## 2019-01-16 ENCOUNTER — Ambulatory Visit
Admission: RE | Admit: 2019-01-16 | Discharge: 2019-01-16 | Disposition: A | Discharge status: Home / Self Care | Payer: BC Managed Care – PPO | Source: Ambulatory Visit | Attending: Family Medicine | Admitting: Family Medicine

## 2019-01-16 DIAGNOSIS — Z1231 Encounter for screening mammogram for malignant neoplasm of breast: Secondary | ICD-10-CM

## 2019-03-22 ENCOUNTER — Ambulatory Visit (INDEPENDENT_AMBULATORY_CARE_PROVIDER_SITE_OTHER): Payer: Self-pay | Admitting: Family Medicine

## 2019-04-03 ENCOUNTER — Ambulatory Visit (INDEPENDENT_AMBULATORY_CARE_PROVIDER_SITE_OTHER): Payer: BC Managed Care – PPO | Admitting: Family Medicine

## 2019-04-03 ENCOUNTER — Other Ambulatory Visit: Payer: Self-pay

## 2019-04-03 ENCOUNTER — Encounter (INDEPENDENT_AMBULATORY_CARE_PROVIDER_SITE_OTHER): Payer: Self-pay | Admitting: Family Medicine

## 2019-04-03 VITALS — BP 138/79 | HR 67 | Temp 98.1°F | Ht 61.0 in | Wt 231.0 lb

## 2019-04-03 DIAGNOSIS — E119 Type 2 diabetes mellitus without complications: Secondary | ICD-10-CM | POA: Diagnosis not present

## 2019-04-03 DIAGNOSIS — E7849 Other hyperlipidemia: Secondary | ICD-10-CM

## 2019-04-03 DIAGNOSIS — Z9189 Other specified personal risk factors, not elsewhere classified: Secondary | ICD-10-CM | POA: Diagnosis not present

## 2019-04-03 DIAGNOSIS — R7989 Other specified abnormal findings of blood chemistry: Secondary | ICD-10-CM

## 2019-04-03 DIAGNOSIS — R5383 Other fatigue: Secondary | ICD-10-CM

## 2019-04-03 DIAGNOSIS — Z6841 Body Mass Index (BMI) 40.0 and over, adult: Secondary | ICD-10-CM

## 2019-04-03 DIAGNOSIS — R0602 Shortness of breath: Secondary | ICD-10-CM | POA: Diagnosis not present

## 2019-04-03 DIAGNOSIS — Z1331 Encounter for screening for depression: Secondary | ICD-10-CM

## 2019-04-03 DIAGNOSIS — I10 Essential (primary) hypertension: Secondary | ICD-10-CM | POA: Diagnosis not present

## 2019-04-03 DIAGNOSIS — J4520 Mild intermittent asthma, uncomplicated: Secondary | ICD-10-CM

## 2019-04-03 DIAGNOSIS — Z0289 Encounter for other administrative examinations: Secondary | ICD-10-CM

## 2019-04-03 NOTE — Progress Notes (Signed)
Dear Dr. Berenice Primas,   Thank you for referring Katrina Beard to our clinic. The following note includes my evaluation and treatment recommendations.  Chief Complaint:   OBESITY Katrina Beard (MR# [id number]) is a 59 y.o. female who presents for evaluation and treatment of obesity and related comorbidities. Current BMI is Body mass index is 43.65 kg/m. Katrina Beard has been struggling with her weight for many years and has been unsuccessful in either losing weight, maintaining weight loss, or reaching her healthy weight goal.  Katrina Beard is currently in the action stage of change and ready to dedicate time achieving and maintaining a healthier weight. Katrina Beard is interested in becoming our patient and working on intensive lifestyle modifications including (but not limited to) diet and exercise for weight loss.  Katrina Beard is status post Roux-en-Y weight loss surgery in 2016 at [facility] and went from 265 pounds to 228 pounds but started regaining it about 6 months ago due to COVID-19.  She states she can eat normal portions. She still has dumping with chocolate ice cream.   Katrina Beard's habits were reviewed today and are as follows: Her family eats meals together, she thinks her family will eat healthier with her, her desired weight loss is 35-40 pounds, she has been heavy most of her life, she is not sure when she began gaining excessive weight, her heaviest weight ever was 280 pounds, she craves chocolate and chips, and she snacks frequently in the evenings.  Depression Screen Katrina Beard's Food and Mood (modified PHQ-9) score was 5.  Depression screen PHQ 2/9 04/03/2019  Decreased Interest 1  Down, Depressed, Hopeless 1  PHQ - 2 Score 2  Altered sleeping 1  Tired, decreased energy 1  Change in appetite 0  Feeling bad or failure about yourself  0  Trouble concentrating 1  Moving slowly or fidgety/restless 0  Suicidal thoughts 0  PHQ-9 Score 5  Difficult doing work/chores Not difficult at all   Subjective:   1.  Other fatigue Katrina Beard denies daytime somnolence and denies waking up still tired. Katrina Beard generally gets 6 or 7 hours of sleep per night, and states that she has generally restful sleep. Snoring is sometimes present. Apneic episodes are present. Epworth Sleepiness Score is 4.  2. SOB (shortness of breath) on exertion Katrina Beard notes increasing shortness of breath with exercising and seems to be worsening over time with weight gain. She notes getting out of breath sooner with activity than she used to. This has gotten worse recently. Katrina Beard denies shortness of breath at rest or orthopnea.  3. Type 2 diabetes mellitus without complication, without long-term current use of insulin (HCC) Medications reviewed. Home glucose monitoring: is performed regularly.  Katrina Beard is taking Jardiance.  Her fasting blood sugars range from 108-123 in the last week.  It appears she is well controlled but there are no recent labs in [place].  Lab Results  Component Value Date   CREATININE 1.70 (H) 07/24/2016   4. Essential hypertension Patient with history of MI in 2003.  She is on amlodipine and Losartan currently, and blood pressure is borderline controlled today.   BP Readings from Last 3 Encounters:  04/03/19 138/79  07/25/16 (!) 142/64  06/19/16 (!) 188/73   5. Other hyperlipidemia Katrina Beard has hyperlipidemia and has been trying to improve her cholesterol levels with intensive lifestyle modification including a low saturated fat diet, exercise and weight loss. She is taking Lipitor.  She has history of MI.  She denies any chest pain.  Lab Results  Component Value Date   ALT 16 05/22/2011   AST 26 05/22/2011   ALKPHOS 63 05/22/2011   BILITOT 0.2 (L) 05/22/2011   6. Mild intermittent asthma, unspecified whether complicated Katrina Beard has a history of asthma exacerbation when she smells marijuana.  She has an inhaler but rarely uses it.  7. Elevated serum creatinine Katrina Beard has a history of elevated creatinine of 1.7 in  2018.  No recent labs.  8. Depression screening The patient was screened for depression as part of her new patient paperwork.  She has some emotional eating behaviors.  She did feel judged about her weight when she was growing up.  9. At risk for hypoglycemia Katrina Beard is at increased risk for hypoglycemia due to changes in diet, diagnosis of diabetes, and/or insulin use. Katrina Beard is not currently taking insulin.   Assessment/Plan:   1. Other fatigue Katrina Beard does feel that her weight is causing her energy to be lower than it should be. Fatigue may be related to obesity, depression or many other causes. Labs will be ordered, and in the meanwhile, Katrina Beard will focus on self care including making healthy food choices, increasing physical activity and focusing on stress reduction. - EKG 12-Lead - Comprehensive metabolic panel - CBC with Differential/Platelet - VITAMIN D 25 Hydroxy (Vit-D Deficiency, Fractures) - Vitamin B12 - Folate - T3 - T4, free - TSH  2. SOB (shortness of breath) on exertion Katrina Beard does not feel that she gets out of breath more easily that she used to when she exercises. Katrina Beard's shortness of breath appears to be obesity related and exercise induced. She has agreed to work on weight loss and gradually increase exercise to treat her exercise induced shortness of breath. Will continue to monitor closely. - Lipid Panel With LDL/HDL Ratio  3. Type 2 diabetes mellitus without complication, without long-term current use of insulin (HCC) Good blood sugar control is important to decrease the likelihood of diabetic complications such as nephropathy, neuropathy, limb loss, blindness, coronary artery disease, and death. Intensive lifestyle modification including diet, exercise and weight loss are the first line of treatment for diabetes.  Diabetes education was given today.  - Hemoglobin A1c - Insulin, random  4. Essential hypertension Katrina Beard is working on healthy weight loss and exercise to  improve blood pressure control. We will watch for signs of hypotension as she continues her lifestyle modifications.  5. Other hyperlipidemia Cardiovascular risk and specific lipid/LDL goals reviewed.  We discussed several lifestyle modifications today and Katrina Beard will continue to work on diet, exercise and weight loss efforts. Orders and follow up as documented in patient record.   Counseling Intensive lifestyle modifications are the first line treatment for this issue. . Dietary changes: Increase soluble fiber. Decrease simple carbohydrates. . Exercise changes: Moderate to vigorous-intensity aerobic activity 150 minutes per week if tolerated. . Lipid-lowering medications: see documented in medical record. - T3 - T4, free - TSH  6. Mild intermittent asthma, unspecified whether complicated Indirect calorimetry today.  Will work on weight loss to decrease risk of exacerbation.   7. Elevated serum creatinine Will check labs and follow.  8. Depression screening Katrina Beard had a positive depression screening. Depression is commonly associated with obesity and often results in emotional eating behaviors. We will monitor this closely and work on CBT to help improve the non-hunger eating patterns. Referral to Psychology may be required if no improvement is seen as she continues in our clinic. -Patient was referred to Dr. Mallie Mussel, our Bariatric Psychologist,  for evaluation due to her elevated PHQ-9 score and significant struggles with emotional eating.  9. At risk for hypoglycemia Katrina Beard was given approximately 15 minutes of counseling today regarding prevention of hypoglycemia. She was advised of symptoms of hypoglycemia. Katrina Beard was instructed to eat regular meals.  She is at risk due to weight loss and diabetes medications.  10. Class 3 severe obesity with serious comorbidity and body mass index (BMI) of 40.0 to 44.9 in adult, unspecified obesity type (HCC) Katrina Beard is currently in the action stage of  change and her goal is to continue with weight loss efforts. I recommend Katrina Beard begin the structured treatment plan as follows:  She has agreed to the Category 2 Plan.  Behavioral modification strategies: increasing lean protein intake and decreasing simple carbohydrates.  She was informed of the importance of frequent follow-up visits to maximize her success with intensive lifestyle modifications for her multiple health conditions. She was informed we would discuss her lab results at her next visit unless there is a critical issue that needs to be addressed sooner. Katrina Beard agreed to keep her next visit at the agreed upon time to discuss these results.  Objective:   Blood pressure 138/79, pulse 67, temperature 98.1 F (36.7 C), temperature source Oral, height 5\' 1"  (1.549 m), weight 231 lb (104.8 kg), last menstrual period 03/15/2019, SpO2 100 %. Body mass index is 43.65 kg/m.  EKG: Normal sinus rhythm, rate 70 bpm.  Indirect Calorimeter completed today shows a VO2 of 273 and a REE of 1901.  Her calculated basal metabolic rate is [id number] thus her basal metabolic rate is better than expected.  General: Cooperative, alert, well developed, in no acute distress. HEENT: Conjunctivae and lids unremarkable. Cardiovascular: Regular rhythm.  Lungs: Normal work of breathing. Neurologic: No focal deficits.   Lab Results  Component Value Date   CREATININE 1.70 (H) 07/24/2016   BUN 18 07/24/2016   NA 137 07/24/2016   K 3.5 07/24/2016   CL 105 07/24/2016   CO2 27 03/30/2013   Lab Results  Component Value Date   ALT 16 05/22/2011   AST 26 05/22/2011   ALKPHOS 63 05/22/2011   BILITOT 0.2 (L) 05/22/2011   Lab Results  Component Value Date   WBC 9.6 07/24/2016   HGB 13.9 07/24/2016   HCT 41.0 07/24/2016   MCV 84.9 07/24/2016   PLT 263 07/24/2016   Attestation Statements:   Reviewed by clinician on day of visit: allergies, medications, problem list, medical history, surgical history, family  history, social history, and previous encounter notes.  I, Water quality scientist, CMA, am acting as transcriptionist for Dennard Nip, MD.  I have reviewed the above documentation for accuracy and completeness, and I agree with the above. - Dennard Nip, MD

## 2019-04-03 NOTE — Progress Notes (Signed)
Office: [phone]  /  Fax: [phone]    Date: April 10, 2019   Time Seen: 9:05am Duration: 31 minutes Provider: Glennie Isle, PsyD Type of Session: Intake for Individual Therapy  Type of Contact: Face-to-face  Informed Consent for In-Person Services During COVID-19: During today's appointment, information about the decision to initiate in-person services in light of the CVELF-81 public health crisis was discussed. Mehgan and this provider agreed to meet in person for some or all future appointments. If there is a resurgence of the pandemic or other health concerns arise, telepsychological services may be initiated and any related concerns will be discussed and an attempt to address them will be made. Meshelle verbally acknowledged understanding that if necessary, this provider may determine there is a need to initiate telepsychological services for everyone's well-being. Keyanna expressed understanding she may request to initiate telepsychological services, and that request will be respected as long as it is feasible and clinically appropriate.  The risks for opting for in-person services was discussed. Dusty verbally acknowledged understanding that by coming to the office, she is assuming the risk of exposure to the coronavirus or other public risk, and the risk may increase if Lawana travels by public transportation, cab, or ridesharing service. To obtain in-person services, Ruthella verbally agreed to taking certain precautions (e.g., screening prior to appointment; universal masking; social distancing of 6 feet; proper hand hygiene) set forth by [facility] to keep everyone safe from exposure and subsequent consequences. This information was shared by front desk staff either at the time of scheduling and/or during the check-in process. Alberta expressed understanding that should she not adhere to these safeguards, it may result in starting/returning to a telepsychological service arrangement and/or the  exploration of other options for treatment. Erryn acknowledged understanding that [facility] will follow the protocol set forth by [facility] should a patient present with a fever or other symptoms or disclose recent exposure, which will include rescheduling the appointment. Furthermore, Sibbie acknowledged understanding that precautions may change if additional local, state or federal orders or guidelines are published.This provider also shared that if Layah tests positive for the coronavirus and was in the [facility] clinic, this provider will follow [facility]'s disclosure policy. Similarly, this provider will follow [facility]'s disclosure policy should this provider or staff test positive for the coronavirus. To avoid handling of paper/writing instruments and increasing likelihood of touching, verbal consent was obtained by [facility] during today's appointment prior to proceeding. Yaslyn provided verbal consent to proceed, and acknowledged understanding that by verbally consenting to proceed, she is agreeable to all information noted above.   Informed Consent: The provider's role was explained to [facility]. The provider reviewed and discussed issues of confidentiality, privacy, and limits therein (e.g., reporting obligations). In addition to verbal informed consent, written informed consent for psychological services was obtained prior to the initial appointment. Since the clinic is not a 24/7 crisis center, mental health emergency resources were shared and this  provider explained MyChart, e-mail, voicemail, and/or other messaging systems should be utilized only for non-emergency reasons. This provider also explained that information obtained during appointments will be placed in [facility] medical record and relevant information will be shared with other providers at [facility] for coordination of care. Moreover, Deeandra agreed information may be shared with other  [facility] providers as needed for coordination of care. By signing the service agreement document, Abrina provided written consent for coordination of care. Dorette  also verbally acknowledged understanding she is ultimately responsible for understanding her insurance benefits for services. Gila  acknowledged understanding that appointments cannot be recorded without both party consent. Dalissa verbally consented to proceed.  Chief Complaint/HPI: Ceirra was referred by Dr. Dennard Nip due to her depression screening. Per the note for the initial visit with Dr. Dennard Nip on April 03, 2019, "The patient was screened for depression as part of her new patient paperwork.  She has some emotional eating behaviors. She did feel judged about her weight when she was growing up." During the initial appointment, Azlyn reported experiencing the following: snacking frequently in the evenings and craving chocolate and chips. Chart review also revealed: "Emonee is status post Roux-en-Y weight loss surgery in 2016 at [facility] and went from 265 pounds to 228 pounds but started regaining it about 6 months ago due to COVID-19. She states she can eat normal portions. She still has dumping with chocolate ice cream." Nakeitha's Food and Mood (modified PHQ-9) score on April 03, 2019 was 5.  During today's appointment, Nelly shared, "I don't eat because of emotions." Sherria was verbally administered a questionnaire assessing various behaviors related to emotional eating. Marcelene endorsed the following: experience food cravings on a regular basis. She shared she craves chips and chocolate during her menstrual cycle. She described eating small amounts of different foods until she is satisfied. In addition, Shyah denied a history of binge eating. Ellaree denied a history of restricting food intake, purging and engagement in other compensatory strategies, and has never been diagnosed with an eating disorder. She also denied a  history of treatment for emotional eating. Moreover, Babygirl shared a history of dieting, noting, "Don't ever work." She also discussed having bariatric surgery for "health issues." Since her surgery, she feels she has been able to maintain her weight. She further shared she gained weight at the onset of the pandemic. Furthermore, Hope denied other problems of concern.    Mental Status Examination:  Appearance: well groomed and appropriate hygiene  Behavior: appropriate to circumstances Mood: euthymic Affect: mood congruent Speech: normal in rate, volume, and tone Eye Contact: appropriate Psychomotor Activity: appropriate Gait: normal Thought Process: linear, logical, and goal directed  Thought Content/Perception: denies suicidal and homicidal ideation, plan, and intent and no hallucinations, delusions, bizarre thinking or behavior reported or observed Orientation: time, person, place and purpose of appointment Memory/Concentration: memory, attention, language, and fund of knowledge intact  Insight/Judgment: good  Family & Psychosocial History: Julyanna reported she is not in a relationship and she has one daughter (age 66). 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 Additionally, Tameca shared her highest level of education obtained is two associate's degrees and cosmetology license. Currently, Jora's social support system consists of her daughter and brother in Sports coach. Moreover, Jaedyn stated she resides with her daughter and a foster child (age 25; female).   Medical History:  Past Medical History:  Diagnosis Date  . Asthma   . CHF (congestive heart failure) ([place])   . Diabetes mellitus   . Gallbladder problem   . HLD (hyperlipidemia)   . Hypertension   . Joint pain   . Knee pain   . MI, old 2003   no stent placement   Past Surgical History:  Procedure Laterality Date  . BREAST EXCISIONAL BIOPSY Bilateral   . BREAST SURGERY    . CHOLECYSTECTOMY    . CYSTOSCOPY W/  URETERAL STENT PLACEMENT Right 07/25/2016   Procedure: CYSTOSCOPY WITH RETROGRADE  PYELOGRAM/LEFT URETERAL STENT PLACEMENT;  Surgeon: Ardis Hughs, MD;  Location: [facility];  Service: Urology;  Laterality: Right;  . HAND SURGERY    . ROUX-EN-Y GASTRIC BYPASS     07/15/2014   Current Outpatient Medications on File Prior to Visit  Medication Sig Dispense Refill  . amLODipine (NORVASC) 10 MG tablet Take 10 mg by mouth daily.    Marland Kitchen aspirin EC 81 MG tablet Take 81 mg by mouth daily.    Marland Kitchen atorvastatin (LIPITOR) 20 MG tablet Take 20 mg by mouth daily.    . empagliflozin (JARDIANCE) 10 MG TABS tablet Take 10 mg by mouth daily.    Marland Kitchen gabapentin (NEURONTIN) 100 MG capsule Take 100 mg by mouth daily as needed.    Marland Kitchen losartan (COZAAR) 100 MG tablet Take 100 mg by mouth daily.    . meloxicam (MOBIC) 15 MG tablet Take 15 mg by mouth daily.    . [DISCONTINUED] fluticasone (FLONASE) 50 MCG/ACT nasal spray Place 2 sprays into the nose daily. 16 g 2   No current facility-administered medications on file prior to visit.  Isabelle denied a history of head injuries and loss of consciousness.    Mental Health History: Reygan reported there is no history of therapeutic services. Erian reported there is no history of hospitalizations for psychiatric concerns, and has never met with a psychiatrist. However, she recalled meeting with a psychologist for bariatric surgery. Valentine stated she has never been prescribed psychotropic medications. Val denied a family history of mental health related concerns. Shylo reported there is no history of trauma including psychological, physical  and sexual abuse, as well as neglect.   Darrelle described her typical mood lately as "okay." She added, "I just don't want to go to work." Aside from concerns noted above and endorsed on the PHQ-9 and GAD-7, Kailei reported experiencing decreased motivation. Darin endorsed current alcohol use, noting she consumes two-three 4oz. mixed drinks twice a  week. She denied tobacco and illicit/recreational substance use. Regarding caffeine intake, Shawnese reported consuming a "couple coffees" twice a week. Furthermore, Terita indicated she is not experiencing the following: hopelessness, obsessions and compulsions, hallucinations and delusions, paranoia, symptoms of mania (e.g., expansive mood, flighty ideas, decreased need for sleep, engagement in risky behaviors), angry outbursts, social withdrawal, crying spells, and panic attacks. She also denied history of and current suicidal ideation, plan, and intent; history of and current homicidal ideation, plan, and intent; and history of and current engagement in self-harm.  The following strengths were reported by Rhealynn: parenting and caring. The following strengths were observed by this provider: ability to express thoughts and feelings during the therapeutic session, ability to establish and benefit from a therapeutic relationship, willingness to work toward established goal(s) with the clinic and ability to engage in reciprocal conversation.  Legal History: Kayron reported there is no history of legal involvement.   Structured Assessments Results: The Patient Health Questionnaire-9 (PHQ-9) is a self-report measure that assesses symptoms and severity of depression over the course of the last two weeks. Makenlee obtained a score of 1 suggesting minimal depression. Shanley finds the endorsed symptoms to be somewhat difficult. [0= Not at all; 1= Several days; 2= More than half the days; 3= Nearly every day] Little interest or pleasure in doing things 0  Feeling down, depressed, or hopeless 0  Trouble falling or staying asleep, or sleeping too much 0  Feeling tired or having little energy 1  Poor appetite or overeating 0  Feeling bad about yourself ---  or that you are a failure or have let yourself or your family down 0  Trouble concentrating on things, such as reading the newspaper or watching television 0  Moving or  speaking so slowly that other people could have noticed? Or the opposite --- being so fidgety or restless that you have been moving around a lot more than usual 0  Thoughts that you would be better off dead or hurting yourself in some way 0  PHQ-9 Score 1    The Generalized Anxiety Disorder-7 (GAD-7) is a brief self-report measure that assesses symptoms of anxiety over the course of the last two weeks. Chelsei obtained a score of 3 suggesting minimal anxiety. Toshia finds the endorsed symptoms to be not difficult at all. [0= Not at all; 1= Several days; 2= Over half the days; 3= Nearly every day] Feeling nervous, anxious, on edge 0  Not being able to stop or control worrying 0  Worrying too much about different things 0  Trouble relaxing 0  Being so restless that it's hard to sit still 0  Becoming easily annoyed or irritable 3  Feeling afraid as if something awful might happen 0  GAD-7 Score 3   Interventions:  Conducted a chart review Focused on rapport building Verbally administered PHQ-9 and GAD-7 for symptom monitoring Verbally administered Food & Mood questionnaire to assess various behaviors related to emotional eating. Provided emphatic reflections and validation Psychoeducation provided regarding physical versus emotional hunger  Provisional DSM-5 Diagnosis: 311 (F32.8) Other Specified Depressive Disorder, Emotional Eating Behaviors  Plan: Krina was provided a handout to increase awareness of hunger patterns and subsequent eating. Tynasia declined future appointments with this provider. She acknowledged understanding that she may request a follow-up appointment with this provider in the future as long as she is still established with the clinic. No further follow-up planned by this provider.

## 2019-04-04 LAB — COMPREHENSIVE METABOLIC PANEL WITH GFR
ALT: 19 IU/L (ref 0–32)
AST: 18 IU/L (ref 0–40)
Albumin/Globulin Ratio: 1.3 (ref 1.2–2.2)
Albumin: 4.1 g/dL (ref 3.8–4.9)
Alkaline Phosphatase: 109 IU/L (ref 39–117)
BUN/Creatinine Ratio: 18 (ref 9–23)
BUN: 14 mg/dL (ref 6–24)
Bilirubin Total: 0.3 mg/dL (ref 0.0–1.2)
CO2: 22 mmol/L (ref 20–29)
Calcium: 9.3 mg/dL (ref 8.7–10.2)
Chloride: 104 mmol/L (ref 96–106)
Creatinine, Ser: 0.79 mg/dL (ref 0.57–1.00)
GFR calc Af Amer: 97 mL/min/1.73 (ref >59)
GFR calc non Af Amer: 84 mL/min/1.73 (ref >59)
Globulin, Total: 3.1 g/dL (ref 1.5–4.5)
Glucose: 106 mg/dL — ABNORMAL HIGH (ref 65–99)
Potassium: 3.9 mmol/L (ref 3.5–5.2)
Sodium: 143 mmol/L (ref 134–144)
Total Protein: 7.2 g/dL (ref 6.0–8.5)

## 2019-04-04 LAB — LIPID PANEL WITH LDL/HDL RATIO
Cholesterol, Total: 153 mg/dL (ref 100–199)
HDL: 51 mg/dL (ref >39)
LDL Chol Calc (NIH): 85 mg/dL (ref 0–99)
LDL/HDL Ratio: 1.7 ratio (ref 0.0–3.2)
Triglycerides: 91 mg/dL (ref 0–149)
VLDL Cholesterol Cal: 17 mg/dL (ref 5–40)

## 2019-04-04 LAB — CBC WITH DIFFERENTIAL/PLATELET
Basophils Absolute: 0.1 10*3/uL (ref 0.0–0.2)
Basos: 1 %
EOS (ABSOLUTE): 0.2 10*3/uL (ref 0.0–0.4)
Eos: 3 %
Hematocrit: 37.9 % (ref 34.0–46.6)
Hemoglobin: 11.8 g/dL (ref 11.1–15.9)
Immature Grans (Abs): 0 10*3/uL (ref 0.0–0.1)
Immature Granulocytes: 0 %
Lymphocytes Absolute: 2.7 10*3/uL (ref 0.7–3.1)
Lymphs: 39 %
MCH: 25.3 pg — ABNORMAL LOW (ref 26.6–33.0)
MCHC: 31.1 g/dL — ABNORMAL LOW (ref 31.5–35.7)
MCV: 81 fL (ref 79–97)
Monocytes Absolute: 0.8 10*3/uL (ref 0.1–0.9)
Monocytes: 11 %
Neutrophils Absolute: 3.2 10*3/uL (ref 1.4–7.0)
Neutrophils: 46 %
Platelets: 331 10*3/uL (ref 150–450)
RBC: 4.67 x10E6/uL (ref 3.77–5.28)
RDW: 15.8 % — ABNORMAL HIGH (ref 11.7–15.4)
WBC: 6.9 10*3/uL (ref 3.4–10.8)

## 2019-04-04 LAB — T3: T3, Total: 103 ng/dL (ref 71–180)

## 2019-04-04 LAB — MICROALBUMIN / CREATININE URINE RATIO
Creatinine, Urine: 110.2 mg/dL
Microalb/Creat Ratio: 10 mg/g creat (ref 0–29)
Microalbumin, Urine: 10.9 ug/mL

## 2019-04-04 LAB — HEMOGLOBIN A1C
Est. average glucose Bld gHb Est-mCnc: 160 mg/dL
Hgb A1c MFr Bld: 7.2 % — ABNORMAL HIGH (ref 4.8–5.6)

## 2019-04-04 LAB — VITAMIN D 25 HYDROXY (VIT D DEFICIENCY, FRACTURES): Vit D, 25-Hydroxy: 22.1 ng/mL — ABNORMAL LOW (ref 30.0–100.0)

## 2019-04-04 LAB — T4, FREE: Free T4: 1.11 ng/dL (ref 0.82–1.77)

## 2019-04-04 LAB — VITAMIN B12: Vitamin B-12: 1983 pg/mL — ABNORMAL HIGH (ref 232–1245)

## 2019-04-04 LAB — FOLATE: Folate: 13 ng/mL (ref >3.0)

## 2019-04-04 LAB — INSULIN, RANDOM: INSULIN: 11.2 u[IU]/mL (ref 2.6–24.9)

## 2019-04-04 LAB — TSH: TSH: 0.575 u[IU]/mL (ref 0.450–4.500)

## 2019-04-10 ENCOUNTER — Ambulatory Visit (INDEPENDENT_AMBULATORY_CARE_PROVIDER_SITE_OTHER): Payer: BC Managed Care – PPO | Admitting: Psychology

## 2019-04-10 ENCOUNTER — Other Ambulatory Visit: Payer: Self-pay

## 2019-04-10 DIAGNOSIS — F3289 Other specified depressive episodes: Secondary | ICD-10-CM

## 2019-04-17 ENCOUNTER — Encounter (INDEPENDENT_AMBULATORY_CARE_PROVIDER_SITE_OTHER): Payer: Self-pay | Admitting: Family Medicine

## 2019-04-17 ENCOUNTER — Other Ambulatory Visit: Payer: Self-pay

## 2019-04-17 ENCOUNTER — Ambulatory Visit (INDEPENDENT_AMBULATORY_CARE_PROVIDER_SITE_OTHER): Payer: BC Managed Care – PPO | Admitting: Family Medicine

## 2019-04-17 VITALS — BP 138/82 | HR 78 | Temp 98.1°F | Ht 61.0 in | Wt 231.0 lb

## 2019-04-17 DIAGNOSIS — F3289 Other specified depressive episodes: Secondary | ICD-10-CM

## 2019-04-17 DIAGNOSIS — E1165 Type 2 diabetes mellitus with hyperglycemia: Secondary | ICD-10-CM | POA: Diagnosis not present

## 2019-04-17 DIAGNOSIS — E559 Vitamin D deficiency, unspecified: Secondary | ICD-10-CM | POA: Diagnosis not present

## 2019-04-17 DIAGNOSIS — Z6841 Body Mass Index (BMI) 40.0 and over, adult: Secondary | ICD-10-CM

## 2019-04-17 DIAGNOSIS — Z9189 Other specified personal risk factors, not elsewhere classified: Secondary | ICD-10-CM | POA: Diagnosis not present

## 2019-04-17 MED ORDER — BUPROPION HCL ER (SR) 150 MG PO TB12: 150.0000 mg | ORAL_TABLET | Freq: Every day | ORAL | 0 refills | Status: DC

## 2019-04-17 MED ORDER — VITAMIN D (ERGOCALCIFEROL) 1.25 MG (50000 UNIT) PO CAPS: 50000.0000 [IU] | ORAL_CAPSULE | ORAL | 0 refills | Status: DC

## 2019-04-18 NOTE — Progress Notes (Signed)
Chief Complaint:   OBESITY Katrina Beard is here to discuss her progress with her obesity treatment plan along with follow-up of her obesity related diagnoses. Katrina Beard is on the Category 2 Plan and states she is following her eating plan approximately 30% of the time. Katrina Beard states she is doing 0 minutes 0 times per week.  Today's visit was #: 2 Starting weight: 231 lbs Starting date: 04/03/2019 Today's weight: 231 lbs Today's date: 04/17/2019 Total lbs lost to date: 0 Total lbs lost since last in-office visit: 0  Interim History: Katrina Beard struggled to follow her eating plan precisely. She found using the food scale to be difficult, and she was tired of her food choices and deviated especially for breakfast. She was however mindful of her food choices and tried to portion control and make smarter choices.  Subjective:   1. Type 2 diabetes mellitus with hyperglycemia, without long-term current use of insulin (HCC) Katrina Beard's fasting BGs range mostly between 90 and 100, and 2 hour post prandials range mostly between 100 and 130's on Jardiance. Her A1c was 7.2 and the goal is below 6.5. She is working on diet and weight loss. I discussed labs with the patient today.  2. At risk for hypoglycemia Katrina Beard is at increased risk for hypoglycemia due to changes in diet, diagnosis of diabetes, and/or insulin use. Katrina Beard is not currently taking insulin.   3. Vitamin D deficiency Katrina Beard has a new diagnosis of Vit D deficiency. Her Vit D level is below goal and she notes fatigue. I discussed labs with the patient today.  4. Other depression, with emotional eating Katrina Beard notes increased emotional eating and cravings, and she feels like she could benefit from medications to decrease her cravings.  Assessment/Plan:   1. Type 2 diabetes mellitus with hyperglycemia, without long-term current use of insulin (HCC) Good blood sugar control is important to decrease the likelihood of diabetic complications such as  nephropathy, neuropathy, limb loss, blindness, coronary artery disease, and death. Intensive lifestyle modification including diet, exercise and weight loss are the first line of treatment for diabetes. Katrina Beard is to continue to check her BGs and continue with diet. We will recheck labs in 3 months.  2. At risk for hypoglycemia Katrina Beard was given approximately 30 minutes of counseling today regarding prevention of hypoglycemia. She was advised of symptoms of hypoglycemia. Katrina Beard was instructed to avoid skipping meals, eat regular protein rich meals and schedule low calorie snacks as needed.   Repetitive spaced learning was employed today to elicit superior memory formation and behavioral change  3. Vitamin D deficiency Low Vitamin D level contributes to fatigue and are associated with obesity, breast, and colon cancer. Katrina Beard agreed to start prescription Vitamin D 50,000 IU every week with no refill. She will follow-up for routine testing of Vitamin D, at least 2-3 times per year to avoid over-replacement. We will recheck labs in 3 months.  - Vitamin D, Ergocalciferol, (DRISDOL) 1.25 MG (50000 UNIT) CAPS capsule; Take 1 capsule (50,000 Units total) by mouth every 7 (seven) days.  Dispense: 4 capsule; Refill: 0  4. Other depression, with emotional eating Behavior modification techniques were discussed today to help Katrina Beard deal with her emotional/non-hunger eating behaviors.  Katrina Beard agreed to start Wellbutrin SR 150 mg daily, with no refill. Orders and follow up as documented in patient record.   - buPROPion (WELLBUTRIN SR) 150 MG 12 hr tablet; Take 1 tablet (150 mg total) by mouth daily.  Dispense: 30 tablet; Refill: 0  5. Class 3 severe obesity with serious comorbidity and body mass index (BMI) of 40.0 to 44.9 in adult, unspecified obesity type (Katrina Beard) Katrina Beard is currently in the action stage of change. As such, her goal is to continue with weight loss efforts. She has agreed to change to keeping a food journal  and adhering to recommended goals of 1200-1500 calories and 85+ grams of protein daily.   Behavioral modification strategies: meal planning and cooking strategies, planning for success and keeping a strict food journal.  Katrina Beard has agreed to follow-up with our clinic in 2 weeks. She was informed of the importance of frequent follow-up visits to maximize her success with intensive lifestyle modifications for her multiple health conditions.   Objective:   Blood pressure 138/82, pulse 78, temperature 98.1 F (36.7 C), temperature source Oral, height 5\' 1"  (1.549 m), weight 231 lb (104.8 kg), last menstrual period 04/15/2019, SpO2 99 %. Body mass index is 43.65 kg/m.  General: Cooperative, alert, well developed, in no acute distress. HEENT: Conjunctivae and lids unremarkable. Cardiovascular: Regular rhythm.  Lungs: Normal work of breathing. Neurologic: No focal deficits.   Lab Results  Component Value Date   CREATININE 0.79 04/03/2019   BUN 14 04/03/2019   NA 143 04/03/2019   K 3.9 04/03/2019   CL 104 04/03/2019   CO2 22 04/03/2019   Lab Results  Component Value Date   ALT 19 04/03/2019   AST 18 04/03/2019   ALKPHOS 109 04/03/2019   BILITOT 0.3 04/03/2019   Lab Results  Component Value Date   HGBA1C 7.2 (H) 04/03/2019   Lab Results  Component Value Date   INSULIN 11.2 04/03/2019   Lab Results  Component Value Date   TSH 0.575 04/03/2019   Lab Results  Component Value Date   CHOL 153 04/03/2019   HDL 51 04/03/2019   LDLCALC 85 04/03/2019   TRIG 91 04/03/2019   Lab Results  Component Value Date   WBC 6.9 04/03/2019   HGB 11.8 04/03/2019   HCT 37.9 04/03/2019   MCV 81 04/03/2019   PLT 331 04/03/2019   No results found for: IRON, TIBC, FERRITIN  Attestation Statements:   Reviewed by clinician on day of visit: allergies, medications, problem list, medical history, surgical history, family history, social history, and previous encounter notes.   I, Trixie Dredge, am acting as transcriptionist for Dennard Nip, MD.  I have reviewed the above documentation for accuracy and completeness, and I agree with the above. -  Dennard Nip, MD

## 2019-05-01 ENCOUNTER — Other Ambulatory Visit: Payer: Self-pay

## 2019-05-01 ENCOUNTER — Encounter (INDEPENDENT_AMBULATORY_CARE_PROVIDER_SITE_OTHER): Payer: Self-pay | Admitting: Family Medicine

## 2019-05-01 ENCOUNTER — Ambulatory Visit (INDEPENDENT_AMBULATORY_CARE_PROVIDER_SITE_OTHER): Payer: BC Managed Care – PPO | Admitting: Family Medicine

## 2019-05-01 VITALS — BP 137/79 | HR 74 | Temp 97.9°F | Ht 61.0 in | Wt 231.0 lb

## 2019-05-01 DIAGNOSIS — E1165 Type 2 diabetes mellitus with hyperglycemia: Secondary | ICD-10-CM | POA: Diagnosis not present

## 2019-05-01 DIAGNOSIS — Z6841 Body Mass Index (BMI) 40.0 and over, adult: Secondary | ICD-10-CM

## 2019-05-01 DIAGNOSIS — F3289 Other specified depressive episodes: Secondary | ICD-10-CM

## 2019-05-01 DIAGNOSIS — Z9189 Other specified personal risk factors, not elsewhere classified: Secondary | ICD-10-CM

## 2019-05-01 MED ORDER — METFORMIN HCL 500 MG PO TABS: 500.0000 mg | ORAL_TABLET | Freq: Every day | ORAL | 0 refills | Status: DC

## 2019-05-01 NOTE — Progress Notes (Signed)
Chief Complaint:   OBESITY Katrina Beard is here to discuss her progress with her obesity treatment plan along with follow-up of her obesity related diagnoses. Katrina Beard is on keeping a food journal and adhering to recommended goals of 1200-1500 calories and 85+ grams of protein daily and states she is following her eating plan approximately 0% of the time. Katrina Beard states she is doing 0 minutes 0 times per week.  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 She reports she is able to eat 4 oz of meat at a meal. She reports trying to make smart choices over the past few weeks rather than starting to journal due to issues with her foster daughter. She does admit to too many chips and sweets.  Subjective:   1. Type 2 diabetes mellitus with hyperglycemia, without long-term current use of insulin (HCC) Katrina Beard's diabetes is not well controlled. She is on Jardiance only. Her metformin was discontinued when insulin started in the past. She reports allergic reaction to an injectable medications for diabetes mellitus (not insulin). She is unsure of name of the drug. She denies hypoglycemia. Lab Results  Component Value Date   HGBA1C 7.2 (H) 04/03/2019    2. Other depression, with emotional eating Katrina Beard's bupropion was started at her last visit. She reports it has not helped with her cravings. She saw Dr. Mallie Mussel  but she does not want to continue to follow up with her.  3. At risk for diarrhea Katrina Beard is at higher risk of diarrhea due to starting metformin at this visit.  Assessment/Plan:   1. Type 2 diabetes mellitus with hyperglycemia, without long-term current use of insulin (HCC) Good blood sugar control is  important to decrease the likelihood of diabetic complications such as nephropathy, neuropathy, limb loss, blindness, coronary artery disease, and death. Intensive lifestyle modification including diet, exercise and weight loss are the first line of treatment for diabetes. Katrina Beard agreed to start metformin 500 mg daily with no refill.  - metFORMIN (GLUCOPHAGE) 500 MG tablet; Take 1 tablet (500 mg total) by mouth daily with breakfast.  Dispense: 30 tablet; Refill: 0  2. Other depression, with emotional eating Behavior modification techniques were discussed today to help Katrina Beard deal with her emotional/non-hunger eating behaviors. Katrina Beard is to discontinue bupropion. Orders and follow up as documented in patient record.   3. At risk for diarrhea Katrina Beard was given approximately 15 minutes of diarrhea prevention counseling today. She is 59 y.o. female and has risk factors for diarrhea including medications and changes in diet. We discussed that the better she sticks to the meal plan the less likely she is to have diarrhea with metformin.   Repetitive spaced learning was employed today to elicit superior memory formation and behavioral change.  4. Class 3 severe obesity with serious comorbidity and body mass index (BMI) of 40.0 to 44.9 in adult, unspecified obesity type ([place]) Katrina Beard is currently in the action stage of change. As such, her goal is to continue with weight loss efforts. She has agreed to keeping a food journal and adhering to recommended goals of 1200-1500 calories and 85 grams of protein daily.   Exercise goals: No exercise has been prescribed at  this time.  Behavioral modification strategies: increasing lean protein intake, decreasing simple carbohydrates and keeping a strict food journal.  Katrina Beard has agreed to follow-up with our clinic in 2 weeks. She was informed of the importance of frequent follow-up visits to maximize her success with intensive lifestyle modifications for her multiple health  conditions.   Objective:   Blood pressure 137/79, pulse 74, temperature 97.9 F (36.6 C), temperature source Oral, height 5\' 1"  (1.549 m), weight 231 lb (104.8 kg), last menstrual period 04/15/2019, SpO2 99 %. Body mass index is 43.65 kg/m.  General: Cooperative, alert, well developed, in no acute distress. HEENT: Conjunctivae and lids unremarkable. Cardiovascular: Regular rhythm.  Lungs: Normal work of breathing. Neurologic: No focal deficits.   Lab Results  Component Value Date   CREATININE 0.79 04/03/2019   BUN 14 04/03/2019   NA 143 04/03/2019   K 3.9 04/03/2019   CL 104 04/03/2019   CO2 22 04/03/2019   Lab Results  Component Value Date   ALT 19 04/03/2019   AST 18 04/03/2019   ALKPHOS 109 04/03/2019   BILITOT 0.3 04/03/2019   Lab Results  Component Value Date   HGBA1C 7.2 (H) 04/03/2019   Lab Results  Component Value Date   INSULIN 11.2 04/03/2019   Lab Results  Component Value Date   TSH 0.575 04/03/2019   Lab Results  Component Value Date   CHOL 153 04/03/2019   HDL 51 04/03/2019   LDLCALC 85 04/03/2019   TRIG 91 04/03/2019   Lab Results  Component Value Date   WBC 6.9 04/03/2019   HGB 11.8 04/03/2019   HCT 37.9 04/03/2019   MCV 81 04/03/2019   PLT 331 04/03/2019   No results found for: IRON, TIBC, FERRITIN  Attestation Statements:   Reviewed by clinician on day of visit: allergies, medications, problem list, medical history, surgical history, family history, social history, and previous encounter notes.   Wilhemena Durie, am acting as Location manager for [employer], FNP-C.  I have reviewed the above documentation for accuracy and completeness, and I agree with the above. - Georgianne Fick, FNP

## 2019-05-02 ENCOUNTER — Encounter (INDEPENDENT_AMBULATORY_CARE_PROVIDER_SITE_OTHER): Payer: Self-pay | Admitting: Family Medicine

## 2019-05-02 DIAGNOSIS — Z6841 Body Mass Index (BMI) 40.0 and over, adult: Secondary | ICD-10-CM | POA: Insufficient documentation

## 2019-05-02 DIAGNOSIS — E119 Type 2 diabetes mellitus without complications: Secondary | ICD-10-CM | POA: Insufficient documentation

## 2019-05-02 DIAGNOSIS — F32A Depression, unspecified: Secondary | ICD-10-CM | POA: Insufficient documentation

## 2019-05-02 DIAGNOSIS — E1165 Type 2 diabetes mellitus with hyperglycemia: Secondary | ICD-10-CM | POA: Insufficient documentation

## 2019-05-02 DIAGNOSIS — F329 Major depressive disorder, single episode, unspecified: Secondary | ICD-10-CM | POA: Insufficient documentation

## 2019-05-12 ENCOUNTER — Ambulatory Visit: Payer: BC Managed Care – PPO | Attending: Internal Medicine

## 2019-05-12 DIAGNOSIS — Z23 Encounter for immunization: Secondary | ICD-10-CM

## 2019-05-12 NOTE — Progress Notes (Signed)
   Covid-19 Vaccination Clinic  Name:  Katrina Beard    MRN: [id number] DOB: February 02, 1961  05/12/2019  [name] was observed post Covid-19 immunization for 15 minutes without incidence. She was provided with Vaccine Information Sheet and instruction to access the V-Safe system.   [name] was instructed to call 911 with any severe reactions post vaccine: Marland Kitchen Difficulty breathing  . Swelling of your face and throat  . A fast heartbeat  . A bad rash all over your body  . Dizziness and weakness    Immunizations Administered    Name Date Dose VIS Date Route   Pfizer COVID-19 Vaccine 05/12/2019  9:47 AM 0.3 mL 02/23/2019 Intramuscular   Manufacturer: [place]   Lot: [id number]   [place]: [id number]

## 2019-05-16 ENCOUNTER — Other Ambulatory Visit: Payer: Self-pay

## 2019-05-16 ENCOUNTER — Encounter (INDEPENDENT_AMBULATORY_CARE_PROVIDER_SITE_OTHER): Payer: Self-pay | Admitting: Family Medicine

## 2019-05-16 ENCOUNTER — Ambulatory Visit (INDEPENDENT_AMBULATORY_CARE_PROVIDER_SITE_OTHER): Payer: BC Managed Care – PPO | Admitting: Family Medicine

## 2019-05-16 VITALS — BP 154/82 | HR 75 | Temp 98.5°F | Ht 61.0 in | Wt 229.0 lb

## 2019-05-16 DIAGNOSIS — I10 Essential (primary) hypertension: Secondary | ICD-10-CM | POA: Diagnosis not present

## 2019-05-16 DIAGNOSIS — Z6841 Body Mass Index (BMI) 40.0 and over, adult: Secondary | ICD-10-CM

## 2019-05-16 DIAGNOSIS — Z9189 Other specified personal risk factors, not elsewhere classified: Secondary | ICD-10-CM

## 2019-05-16 DIAGNOSIS — E1165 Type 2 diabetes mellitus with hyperglycemia: Secondary | ICD-10-CM

## 2019-05-16 MED ORDER — AMLODIPINE BESYLATE 10 MG PO TABS: 10.0000 mg | ORAL_TABLET | Freq: Every day | ORAL | 0 refills | Status: DC

## 2019-05-16 NOTE — Progress Notes (Signed)
Chief Complaint:   OBESITY Katrina Beard is here to discuss her progress with her obesity treatment plan along with follow-up of her obesity related diagnoses. Katrina Beard is on keeping a food journal and adhering to recommended goals of 1200-1500 calories and 85 grams of protein and states she is following her eating plan approximately 50% of the time. Katrina Beard states she is exercising 0 minutes 0 times per week.  Today's visit was #: 4 Starting weight: 231 lbs Starting date: 04/03/2019 Today's weight: 229 lbs Today's date: 05/16/2019 Total lbs lost to date: 2 Total lbs lost since last in-office visit: 2  Interim History: Katrina Beard is not journaling consistently. She does not understand how to use My Fitness Pal.  Subjective:   Type 2 diabetes mellitus without complication, without long-term current use of insulin ([place]). Diabetes is well controlled. Fasting blood sugars range between 93 and 105 with 2-hour postprandials in the 120's. No hypoglycemia. We added metformin at her last visit and she is tolerating it well.  Lab Results  Component Value Date   HGBA1C 7.2 (H) 04/03/2019   Lab Results  Component Value Date   LDLCALC 85 04/03/2019   CREATININE 0.79 04/03/2019   Lab Results  Component Value Date   INSULIN 11.2 04/03/2019   Essential hypertension. Blood pressure is elevated today however is generally well controlled.Katrina Beard is on losartan and Norvasc. No chest pain or shortness of breath.  BP Readings from Last 3 Encounters:  05/16/19 (!) 154/82  05/01/19 137/79  04/17/19 138/82   Lab Results  Component Value Date   CREATININE 0.79 04/03/2019   CREATININE 1.70 (H) 07/24/2016   CREATININE 0.89 03/30/2013   At increased risk of exposure to COVID-19 virus. 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  Assessment/Plan:   Type 2 diabetes mellitus without  complication, without long-term current use of insulin ([place]). Good blood sugar control is important to decrease the likelihood of diabetic complications such as nephropathy, neuropathy, limb loss, blindness, coronary artery disease, and death. Intensive lifestyle modification including diet, exercise and weight loss are the first line of treatment for diabetes. Katrina Beard will continue metformin and Jardiance.  Essential hypertension. Katrina Beard is working on healthy weight loss and exercise to improve blood pressure control. We will watch for signs of hypotension as she continues her lifestyle modifications. She was given a refill on her amLODipine (NORVASC) 10 MG tablet daily #30 with 0 refills and will continue losartan. Will continue to monitor.  At increased risk of exposure to COVID-19 virus.  She plans on getting her second vaccine.  Counseling  COVID-19 is a respiratory infection that is caused by a virus. It can cause serious infections, such as pneumonia, acute respiratory distress syndrome, acute respiratory failure, or sepsis.  You are more likely to develop a serious illness if you are 74 years of age or older, have a weak immune system, live in a nursing home, have chronic disease, or have obesity.  Get vaccinated as soon as they are available to you.  For our most current information, please visit [email or web address].  Wash your hands often with soap and water for 20 seconds. If soap and water are not available, use alcohol-based hand sanitizer.  Wear a face mask. Make sure your mask covers your nose and mouth.  Maintain at least 6 feet distance from others when in  public.  Get help right away if  You have trouble breathing, chest pain, confusion, or other concerning symptoms.   Repetitive spaced learning was employed today to elicit superior memory formation and behavioral change.  Class 3 severe obesity with serious comorbidity and body mass index (BMI) of 40.0 to 44.9 in  adult, unspecified obesity type ([place]).  Katrina Beard is currently in the action stage of change. As such, her goal is to continue with weight loss efforts. She has agreed to keeping a food journal and adhering to recommended goals of 1200-1500 calories and 85 grams of protein.   I demonstrated My Fitness Pal and she feels that she understands how to use it.  Exercise goals: For substantial health benefits, adults should do at least 150 minutes (2 hours and 30 minutes) a week of moderate-intensity, or 75 minutes (1 hour and 15 minutes) a week of vigorous-intensity aerobic physical activity, or an equivalent combination of moderate- and vigorous-intensity aerobic activity. Aerobic activity should be performed in episodes of at least 10 minutes, and preferably, it should be spread throughout the week.  Behavioral modification strategies: increasing lean protein intake and no skipping meals.  Katrina Beard has agreed to follow-up with our clinic in 2 weeks. She was informed of the importance of frequent follow-up visits to maximize her success with intensive lifestyle modifications for her multiple health conditions.   Objective:   Blood pressure (!) 154/82, pulse 75, temperature 98.5 F (36.9 C), temperature source Oral, height 5\' 1"  (1.549 m), weight 229 lb (103.9 kg), last menstrual period 04/14/2018, SpO2 98 %. Body mass index is 43.27 kg/m.  General: Cooperative, alert, well developed, in no acute distress. HEENT: Conjunctivae and lids unremarkable. Cardiovascular: Regular rhythm.  Lungs: Normal work of breathing. Neurologic: No focal deficits.   Lab Results  Component Value Date   CREATININE 0.79 04/03/2019   BUN 14 04/03/2019   NA 143 04/03/2019   K 3.9 04/03/2019   CL 104 04/03/2019   CO2 22 04/03/2019   Lab Results  Component Value Date   ALT 19 04/03/2019   AST 18 04/03/2019   ALKPHOS 109 04/03/2019   BILITOT 0.3 04/03/2019   Lab Results  Component Value Date   HGBA1C 7.2 (H)  04/03/2019   Lab Results  Component Value Date   INSULIN 11.2 04/03/2019   Lab Results  Component Value Date   TSH 0.575 04/03/2019   Lab Results  Component Value Date   CHOL 153 04/03/2019   HDL 51 04/03/2019   LDLCALC 85 04/03/2019   TRIG 91 04/03/2019   Lab Results  Component Value Date   WBC 6.9 04/03/2019   HGB 11.8 04/03/2019   HCT 37.9 04/03/2019   MCV 81 04/03/2019   PLT 331 04/03/2019   No results found for: IRON, TIBC, FERRITIN  Attestation Statements:   Reviewed by clinician on day of visit: allergies, medications, problem list, medical history, surgical history, family history, social history, and previous encounter notes.  IMichaelene Song, am acting as Location manager for [employer], FNP   I have reviewed the above documentation for accuracy and completeness, and I agree with the above. - Georgianne Fick, FNP

## 2019-05-17 ENCOUNTER — Encounter (INDEPENDENT_AMBULATORY_CARE_PROVIDER_SITE_OTHER): Payer: Self-pay | Admitting: Family Medicine

## 2019-05-17 DIAGNOSIS — I10 Essential (primary) hypertension: Secondary | ICD-10-CM | POA: Insufficient documentation

## 2019-05-17 DIAGNOSIS — I152 Hypertension secondary to endocrine disorders: Secondary | ICD-10-CM | POA: Insufficient documentation

## 2019-05-17 DIAGNOSIS — E1159 Type 2 diabetes mellitus with other circulatory complications: Secondary | ICD-10-CM | POA: Insufficient documentation

## 2019-05-25 ENCOUNTER — Other Ambulatory Visit (INDEPENDENT_AMBULATORY_CARE_PROVIDER_SITE_OTHER): Payer: Self-pay | Admitting: Family Medicine

## 2019-05-25 DIAGNOSIS — E559 Vitamin D deficiency, unspecified: Secondary | ICD-10-CM

## 2019-05-28 ENCOUNTER — Other Ambulatory Visit (INDEPENDENT_AMBULATORY_CARE_PROVIDER_SITE_OTHER): Payer: Self-pay | Admitting: Family Medicine

## 2019-05-28 DIAGNOSIS — E1165 Type 2 diabetes mellitus with hyperglycemia: Secondary | ICD-10-CM

## 2019-05-30 ENCOUNTER — Other Ambulatory Visit: Payer: Self-pay

## 2019-05-30 ENCOUNTER — Ambulatory Visit (INDEPENDENT_AMBULATORY_CARE_PROVIDER_SITE_OTHER): Payer: BC Managed Care – PPO | Admitting: Family Medicine

## 2019-05-30 ENCOUNTER — Encounter (INDEPENDENT_AMBULATORY_CARE_PROVIDER_SITE_OTHER): Payer: Self-pay | Admitting: Family Medicine

## 2019-05-30 VITALS — BP 158/89 | HR 79 | Temp 98.3°F | Ht 61.0 in | Wt 231.0 lb

## 2019-05-30 DIAGNOSIS — Z6841 Body Mass Index (BMI) 40.0 and over, adult: Secondary | ICD-10-CM

## 2019-05-30 DIAGNOSIS — E559 Vitamin D deficiency, unspecified: Secondary | ICD-10-CM | POA: Diagnosis not present

## 2019-05-30 DIAGNOSIS — I10 Essential (primary) hypertension: Secondary | ICD-10-CM

## 2019-05-30 DIAGNOSIS — Z9189 Other specified personal risk factors, not elsewhere classified: Secondary | ICD-10-CM

## 2019-05-30 DIAGNOSIS — E1159 Type 2 diabetes mellitus with other circulatory complications: Secondary | ICD-10-CM

## 2019-05-30 DIAGNOSIS — I152 Hypertension secondary to endocrine disorders: Secondary | ICD-10-CM

## 2019-05-30 DIAGNOSIS — E1165 Type 2 diabetes mellitus with hyperglycemia: Secondary | ICD-10-CM | POA: Diagnosis not present

## 2019-05-30 MED ORDER — METFORMIN HCL 500 MG PO TABS: 500.0000 mg | ORAL_TABLET | Freq: Every day | ORAL | 0 refills | Status: DC

## 2019-05-30 MED ORDER — VITAMIN D (ERGOCALCIFEROL) 1.25 MG (50000 UNIT) PO CAPS: 50000.0000 [IU] | ORAL_CAPSULE | ORAL | 0 refills | Status: DC

## 2019-05-31 ENCOUNTER — Encounter (INDEPENDENT_AMBULATORY_CARE_PROVIDER_SITE_OTHER): Payer: Self-pay | Admitting: Family Medicine

## 2019-05-31 DIAGNOSIS — E559 Vitamin D deficiency, unspecified: Secondary | ICD-10-CM | POA: Insufficient documentation

## 2019-05-31 NOTE — Progress Notes (Signed)
Chief Complaint:   OBESITY Katrina Beard is here to discuss her progress with her obesity treatment plan along with follow-up of her obesity related diagnoses. Katrina Beard is keeping a food journal and adhering to recommended goals of 1200-1500 calories and 85 grams of protein and states she is following her eating plan approximately 40% of the time. Katrina Beard states she is exercising 0 minutes 0 times per week.  Today's visit was #: 5 Starting weight: 231 lbs Starting date: 04/03/2019 Today's weight: 231 lbs Today's date: 05/30/2019 Total lbs lost to date: 0 Total lbs lost since last in-office visit: 0  Interim History: Katrina Beard has been eating out with her friends for lunch most days and going off plan. She has decided to stop doing this to stick to the plan better. She has not journaled this week.  Subjective:   Vitamin D deficiency. Last Vitamin D level was not at goal - 22.1 on 04/03/2019. Katrina Beard is on prescription Vitamin D.  Type 2 diabetes mellitus with hyperglycemia, without long-term current use of insulin (Katrina Beard). A1c elevated at 7.2 on 04/03/2019. She is on Lipitor per evidence based guidelines.  Lab Results  Component Value Date   HGBA1C 7.2 (H) 04/03/2019   Lab Results  Component Value Date   LDLCALC 85 04/03/2019   CREATININE 0.79 04/03/2019   Lab Results  Component Value Date   INSULIN 11.2 04/03/2019   Essential hypertension. Blood pressure is elevated today. She reports being compliant with her medications. She does admit to having an energy drink today.  BP Readings from Last 3 Encounters:  05/30/19 (!) 158/89  05/16/19 (!) 154/82  05/01/19 137/79   Lab Results  Component Value Date   CREATININE 0.79 04/03/2019   CREATININE 1.70 (H) 07/24/2016   CREATININE 0.89 03/30/2013   At risk for heart disease. Katrina Beard is at a higher than average risk for cardiovascular disease due to obesity, DM, and HTN.   Assessment/Plan:   Vitamin D deficiency. Low Vitamin D level  contributes to fatigue and are associated with obesity, breast, and colon cancer. She was given a refill on her Vitamin D, Ergocalciferol, (DRISDOL) 1.25 MG (50000 UNIT) CAPS capsule every week #4 with 0 refills and will follow-up for routine testing of Vitamin D, at least 2-3 times per year to avoid over-replacement.    Type 2 diabetes mellitus with hyperglycemia, without long-term current use of insulin (Katrina Beard). Good blood sugar control is important to decrease the likelihood of diabetic complications such as nephropathy, neuropathy, limb loss, blindness, coronary artery disease, and death. Intensive lifestyle modification including diet, exercise and weight loss are the first line of treatment for diabetes. Katrina Beard was given a refill on her metFORMIN (GLUCOPHAGE) 500 MG tablet QAM #30 with 0 refills.  Essential hypertension. Katrina Beard is working on healthy weight loss and exercise to improve blood pressure control. We will watch for signs of hypotension as she continues her lifestyle modifications. She will continue all of her medications as directed and will avoid energy drinks.  At risk for heart disease. Katrina Beard was given approximately 15 minutes of coronary artery disease prevention counseling today. She is 59 y.o. female and has risk factors for heart disease including obesity. We discussed intensive lifestyle modifications today with an emphasis on specific weight loss instructions and strategies.   Repetitive spaced learning was employed today to elicit superior memory formation and behavioral change.  Class 3 severe obesity with serious comorbidity and body mass index (BMI) of 40.0 to 44.9 in  adult, unspecified obesity type (Katrina Beard).  Katrina Beard is currently in the action stage of change. As such, her goal is to continue with weight loss efforts. She has agreed to keeping a food journal and adhering to recommended goals of 1200-1500 calories and 85 grams of protein daily. She will get back to journaling daily.   Exercise goals: No exercise has been prescribed at this time.  Behavioral modification strategies: increasing lean protein intake, decreasing simple carbohydrates, decreasing eating out, dealing with friend sabotage and keeping a strict food journal.  Katrina Beard has agreed to follow-up with our clinic in 2 weeks. She was informed of the importance of frequent follow-up visits to maximize her success with intensive lifestyle modifications for her multiple health conditions.   Objective:   Blood pressure (!) 158/89, pulse 79, temperature 98.3 F (36.8 C), temperature source Oral, height 5\' 1"  (1.549 m), weight 231 lb (104.8 kg), last menstrual period 04/15/2019, SpO2 100 %. Body mass index is 43.65 kg/m.  General: Cooperative, alert, well developed, in no acute distress. HEENT: Conjunctivae and lids unremarkable. Cardiovascular: Regular rhythm.  Lungs: Normal work of breathing. Neurologic: No focal deficits.   Lab Results  Component Value Date   CREATININE 0.79 04/03/2019   BUN 14 04/03/2019   NA 143 04/03/2019   K 3.9 04/03/2019   CL 104 04/03/2019   CO2 22 04/03/2019   Lab Results  Component Value Date   ALT 19 04/03/2019   AST 18 04/03/2019   ALKPHOS 109 04/03/2019   BILITOT 0.3 04/03/2019   Lab Results  Component Value Date   HGBA1C 7.2 (H) 04/03/2019   Lab Results  Component Value Date   INSULIN 11.2 04/03/2019   Lab Results  Component Value Date   TSH 0.575 04/03/2019   Lab Results  Component Value Date   CHOL 153 04/03/2019   HDL 51 04/03/2019   LDLCALC 85 04/03/2019   TRIG 91 04/03/2019   Lab Results  Component Value Date   WBC 6.9 04/03/2019   HGB 11.8 04/03/2019   HCT 37.9 04/03/2019   MCV 81 04/03/2019   PLT 331 04/03/2019   No results found for: IRON, TIBC, FERRITIN  Attestation Statements:   Reviewed by clinician on day of visit: allergies, medications, problem list, medical history, surgical history, family history, social history, and  previous encounter notes.  IMichaelene Song, am acting as Location manager for [employer], FNP   I have reviewed the above documentation for accuracy and completeness, and I agree with the above. -  Georgianne Fick, FNP

## 2019-06-02 ENCOUNTER — Ambulatory Visit: Payer: BC Managed Care – PPO | Attending: Internal Medicine

## 2019-06-02 DIAGNOSIS — Z23 Encounter for immunization: Secondary | ICD-10-CM

## 2019-06-02 NOTE — Progress Notes (Signed)
   Covid-19 Vaccination Clinic  Name:  Katrina Beard    MRN: [id number] DOB: 06/09/1960  06/02/2019  [name] was observed post Covid-19 immunization for 15 minutes without incident. She was provided with Vaccine Information Sheet and instruction to access the V-Safe system.   [name] was instructed to call 911 with any severe reactions post vaccine: Marland Kitchen Difficulty breathing  . Swelling of face and throat  . A fast heartbeat  . A bad rash all over body  . Dizziness and weakness   Immunizations Administered    Name Date Dose VIS Date Route   Pfizer COVID-19 Vaccine 06/02/2019 12:11 PM 0.3 mL 02/23/2019 Intramuscular   Manufacturer: [place]   Lot: [id number]   [place]: [id number]

## 2019-06-06 ENCOUNTER — Ambulatory Visit: Payer: BC Managed Care – PPO

## 2019-06-14 ENCOUNTER — Encounter (INDEPENDENT_AMBULATORY_CARE_PROVIDER_SITE_OTHER): Payer: Self-pay | Admitting: Family Medicine

## 2019-06-14 ENCOUNTER — Ambulatory Visit (INDEPENDENT_AMBULATORY_CARE_PROVIDER_SITE_OTHER): Payer: BC Managed Care – PPO | Admitting: Family Medicine

## 2019-06-14 ENCOUNTER — Other Ambulatory Visit: Payer: Self-pay

## 2019-06-14 VITALS — BP 131/81 | HR 81 | Temp 98.2°F | Ht 61.0 in | Wt 228.0 lb

## 2019-06-14 DIAGNOSIS — I10 Essential (primary) hypertension: Secondary | ICD-10-CM

## 2019-06-14 DIAGNOSIS — Z9189 Other specified personal risk factors, not elsewhere classified: Secondary | ICD-10-CM

## 2019-06-14 DIAGNOSIS — F3289 Other specified depressive episodes: Secondary | ICD-10-CM | POA: Diagnosis not present

## 2019-06-14 DIAGNOSIS — E1159 Type 2 diabetes mellitus with other circulatory complications: Secondary | ICD-10-CM

## 2019-06-14 DIAGNOSIS — Z6841 Body Mass Index (BMI) 40.0 and over, adult: Secondary | ICD-10-CM

## 2019-06-14 DIAGNOSIS — I152 Hypertension secondary to endocrine disorders: Secondary | ICD-10-CM

## 2019-06-14 MED ORDER — BUPROPION HCL ER (SR) 150 MG PO TB12: 150.0000 mg | ORAL_TABLET | Freq: Every day | ORAL | 0 refills | Status: DC

## 2019-06-14 NOTE — Progress Notes (Addendum)
Chief Complaint:   OBESITY Katrina Beard is here to discuss her progress with her obesity treatment plan along with follow-up of her obesity related diagnoses. Katrina Beard is on keeping a food journal and adhering to recommended goals of 1200-1500 calories and 85 grams of protein daily and states she is following her eating plan approximately 0% of the time. Katrina Beard states she is walking for 20 minutes 3 times per week.  Today's visit was #: 6 Starting weight: 231 lbs Starting date: 04/03/2019 Today's weight: 228 lbs Today's date: 06/14/2019 Total lbs lost to date: 3 Total lbs lost since last in-office visit: 3  Interim History: Katrina Beard struggles to get to 1200 calories per day. She is unsure if she is meeting her protein goals because she doesn't know how to check it in [employer]. She is leaving for the beach today and she plans on sticking to the plan. She denies skipping meals.  Subjective:   1. Essential hypertension Katrina Beard's blood pressure is well controlled. Her blood pressure was elevated at her last office visit because she had an energy drink before the visit. She is on Norvasc and losartan. BP Readings from Last 3 Encounters:  06/14/19 131/81  05/30/19 (!) 158/89  05/16/19 (!) 154/82    2. Other depression, with emotional eating Katrina Beard notes cravings. We tried bupropion in the past and she did not feel it helped. However, she would like to try it again.  3. At risk for heart disease Katrina Beard is at a higher than average risk for cardiovascular disease due to obesity, HTN, and DM.  Assessment/Plan:   1. Essential hypertension Katrina Beard is working on healthy weight loss and exercise to improve blood pressure control. We will watch for signs of hypotension as she continues her lifestyle modifications. Katrina Beard will continue all of her medications, an will follow up.  2. Other depression, with emotional eating Behavior modification techniques were discussed today to help Katrina Beard deal with her  emotional/non-hunger eating behaviors.  Katrina Beard agreed to start bupropion SR 150 mg daily with no refills. Orders and follow up as documented in patient record.   - buPROPion (WELLBUTRIN SR) 150 MG 12 hr tablet; Take 1 tablet (150 mg total) by mouth daily.  Dispense: 30 tablet; Refill: 0  3. At risk for heart disease Katrina Beard was given approximately 15 minutes of coronary artery disease prevention counseling today. She is 59 y.o. female and has risk factors for heart disease including obesity. We discussed intensive lifestyle modifications today with an emphasis on specific weight loss instructions and strategies.   Repetitive spaced learning was employed today to elicit superior memory formation and behavioral change.  4. Class 3 severe obesity with serious comorbidity and body mass index (BMI) of 40.0 to 44.9 in adult, unspecified obesity type (Katrina Beard) Katrina Beard is currently in the action stage of change. As such, her goal is to continue with weight loss efforts. She has agreed to keeping a food journal and adhering to recommended goals of 1200-1500 calories and 85 grams of protein daily.   Exercise goals: As is.  Behavioral modification strategies: increasing lean protein intake and increasing water intake.  I demonstrated and wrote down how to track protein in My[employer]itness[employer].   Katrina Beard has agreed to follow-up with our clinic in 2 to 3 weeks. She was informed of the importance of frequent follow-up visits to maximize her success with intensive lifestyle modifications for her multiple health conditions.   Objective:   Blood pressure 131/81, pulse 81, temperature 98.2 F (  36.8 C), temperature source Oral, height 5\' 1"  (1.549 m), weight 228 lb (103.4 kg), SpO2 100 %. Body mass index is 43.08 kg/m.  General: Cooperative, alert, well developed, in no acute distress. HEENT: Conjunctivae and lids unremarkable. Cardiovascular: Regular rhythm.  Lungs: Normal work of breathing. Neurologic: No focal deficits.    Lab Results  Component Value Date   CREATININE 0.79 04/03/2019   BUN 14 04/03/2019   NA 143 04/03/2019   K 3.9 04/03/2019   CL 104 04/03/2019   CO2 22 04/03/2019   Lab Results  Component Value Date   ALT 19 04/03/2019   AST 18 04/03/2019   ALKPHOS 109 04/03/2019   BILITOT 0.3 04/03/2019   Lab Results  Component Value Date   HGBA1C 7.2 (H) 04/03/2019   Lab Results  Component Value Date   INSULIN 11.2 04/03/2019   Lab Results  Component Value Date   TSH 0.575 04/03/2019   Lab Results  Component Value Date   CHOL 153 04/03/2019   HDL 51 04/03/2019   LDLCALC 85 04/03/2019   TRIG 91 04/03/2019   Lab Results  Component Value Date   WBC 6.9 04/03/2019   HGB 11.8 04/03/2019   HCT 37.9 04/03/2019   MCV 81 04/03/2019   PLT 331 04/03/2019   No results found for: IRON, TIBC, FERRITIN  Attestation Statements:   Reviewed by clinician on day of visit: allergies, medications, problem list, medical history, surgical history, family history, social history, and previous encounter notes.   Wilhemena Durie, am acting as Location manager for [employer], FNP-C.  I have reviewed the above documentation for accuracy and completeness, and I agree with the above. -  Georgianne Fick, FNP

## 2019-07-02 ENCOUNTER — Encounter (INDEPENDENT_AMBULATORY_CARE_PROVIDER_SITE_OTHER): Payer: Self-pay | Admitting: Family Medicine

## 2019-07-02 ENCOUNTER — Ambulatory Visit (INDEPENDENT_AMBULATORY_CARE_PROVIDER_SITE_OTHER): Payer: BC Managed Care – PPO | Admitting: Family Medicine

## 2019-07-02 ENCOUNTER — Other Ambulatory Visit: Payer: Self-pay

## 2019-07-02 VITALS — BP 130/78 | HR 81 | Temp 98.5°F | Ht 61.0 in | Wt 229.0 lb

## 2019-07-02 DIAGNOSIS — Z9189 Other specified personal risk factors, not elsewhere classified: Secondary | ICD-10-CM | POA: Diagnosis not present

## 2019-07-02 DIAGNOSIS — E1165 Type 2 diabetes mellitus with hyperglycemia: Secondary | ICD-10-CM | POA: Diagnosis not present

## 2019-07-02 DIAGNOSIS — E559 Vitamin D deficiency, unspecified: Secondary | ICD-10-CM

## 2019-07-02 DIAGNOSIS — Z6841 Body Mass Index (BMI) 40.0 and over, adult: Secondary | ICD-10-CM

## 2019-07-02 MED ORDER — JARDIANCE 10 MG PO TABS: 10.0000 mg | ORAL_TABLET | Freq: Every day | ORAL | 0 refills | Status: DC

## 2019-07-02 MED ORDER — VITAMIN D (ERGOCALCIFEROL) 1.25 MG (50000 UNIT) PO CAPS: 50000.0000 [IU] | ORAL_CAPSULE | ORAL | 0 refills | Status: DC

## 2019-07-02 MED ORDER — METFORMIN HCL 500 MG PO TABS: 500.0000 mg | ORAL_TABLET | Freq: Every day | ORAL | 0 refills | Status: DC

## 2019-07-03 NOTE — Progress Notes (Signed)
Chief Complaint:   OBESITY Katrina Beard is here to discuss her progress with her obesity treatment plan along with follow-up of her obesity related diagnoses. Katrina Beard is on keeping a food journal and adhering to recommended goals of 1200-1500 calories and 85 grams of protein daily and states she is following her eating plan approximately 60% of the time. Katrina Beard states she is walking for 30 minutes 5 times per week.  Today's visit was #: 7 Starting weight: 231 lbs Starting date: 04/03/2019 Today's weight: 229 lbs Today's date: 07/02/2019 Total lbs lost to date: 2 Total lbs lost since last in-office visit: 0  Interim History: Katrina Beard is eating out at lunch 5 days per week. She often gets salad but she uses the dressing at the restaurant. She is skipping supper quite often. She is journaling sporadically.  Subjective:   1. Vitamin D deficiency Katrina Beard's last Vit D level was low at 22.1. She is on high dose Vit D.   2. Type 2 diabetes mellitus with hyperglycemia, without long-term current use of insulin (Katrina Beard) Katrina Beard's diabetes mellitus is not well controlled. Last A1c was 7.2. She is on Jardiance and metformin.  3. At risk for deficient intake of food The patient is at a higher than average risk of deficient intake of food due to lack of protein.  Assessment/Plan:   1. Vitamin D deficiency Low Vitamin D level contributes to fatigue and are associated with obesity, breast, and colon cancer. We will refill prescription Vitamin D for 1 month. Katrina Beard will follow-up for routine testing of Vitamin D, at least 2-3 times per year to avoid over-replacement.  - Vitamin D, Ergocalciferol, (DRISDOL) 1.25 MG (50000 UNIT) CAPS capsule; Take 1 capsule (50,000 Units total) by mouth every 7 (seven) days.  Dispense: 4 capsule; Refill: 0  2. Type 2 diabetes mellitus with hyperglycemia, without long-term current use of insulin (HCC) Good blood sugar control is important to decrease the likelihood of diabetic  complications such as nephropathy, neuropathy, limb loss, blindness, coronary artery disease, and death. Intensive lifestyle modification including diet, exercise and weight loss are the first line of treatment for diabetes. We will refill metformin and Jardiance for 1 month.  - metFORMIN (GLUCOPHAGE) 500 MG tablet; Take 1 tablet (500 mg total) by mouth daily with breakfast.  Dispense: 30 tablet; Refill: 0 - empagliflozin (JARDIANCE) 10 MG TABS tablet; Take 10 mg by mouth daily.  Dispense: 30 tablet; Refill: 0  3. At risk for deficient intake of food Katrina Beard was given approximately 15 minutes of deficit intake of food prevention counseling today. Katrina Beard is at risk for eating too few calories based on current food recall. She was encouraged to focus on meeting caloric and protein goals according to her recommended meal plan.   4. Class 3 severe obesity with serious comorbidity and body mass index (BMI) of 40.0 to 44.9 in adult, unspecified obesity type ([place]) Katrina Beard is currently in the action stage of change. As such, her goal is to continue with weight loss efforts. She has agreed to the Category 2 Plan with breakfast and lunch options.   Exercise goals: As is.  Behavioral modification strategies: increasing lean protein intake, no skipping meals and planning for success.  Katrina Beard has agreed to follow-up with our clinic in 2 weeks. She was informed of the importance of frequent follow-up visits to maximize her success with intensive lifestyle modifications for her multiple health conditions.   Objective:   Blood pressure 130/78, pulse 81, temperature 98.5 F (  36.9 C), temperature source Oral, height 5\' 1"  (1.549 m), weight 229 lb (103.9 kg), SpO2 99 %. Body mass index is 43.27 kg/m.  General: Cooperative, alert, well developed, in no acute distress. HEENT: Conjunctivae and lids unremarkable. Cardiovascular: Regular rhythm.  Lungs: Normal work of breathing. Neurologic: No focal deficits.   Lab  Results  Component Value Date   CREATININE 0.79 04/03/2019   BUN 14 04/03/2019   NA 143 04/03/2019   K 3.9 04/03/2019   CL 104 04/03/2019   CO2 22 04/03/2019   Lab Results  Component Value Date   ALT 19 04/03/2019   AST 18 04/03/2019   ALKPHOS 109 04/03/2019   BILITOT 0.3 04/03/2019   Lab Results  Component Value Date   HGBA1C 7.2 (H) 04/03/2019   Lab Results  Component Value Date   INSULIN 11.2 04/03/2019   Lab Results  Component Value Date   TSH 0.575 04/03/2019   Lab Results  Component Value Date   CHOL 153 04/03/2019   HDL 51 04/03/2019   LDLCALC 85 04/03/2019   TRIG 91 04/03/2019   Lab Results  Component Value Date   WBC 6.9 04/03/2019   HGB 11.8 04/03/2019   HCT 37.9 04/03/2019   MCV 81 04/03/2019   PLT 331 04/03/2019   No results found for: IRON, TIBC, FERRITIN  Attestation Statements:   Reviewed by clinician on day of visit: allergies, medications, problem list, medical history, surgical history, family history, social history, and previous encounter notes.   Katrina Beard, am acting as Location manager for Katrina Schwab, FNP-C.  I have reviewed the above documentation for accuracy and completeness, and I agree with the above. -  Katrina Fick, FNP

## 2019-07-04 ENCOUNTER — Encounter (INDEPENDENT_AMBULATORY_CARE_PROVIDER_SITE_OTHER): Payer: Self-pay | Admitting: Family Medicine

## 2019-07-17 ENCOUNTER — Encounter (INDEPENDENT_AMBULATORY_CARE_PROVIDER_SITE_OTHER): Payer: Self-pay | Admitting: Family Medicine

## 2019-07-17 ENCOUNTER — Ambulatory Visit (INDEPENDENT_AMBULATORY_CARE_PROVIDER_SITE_OTHER): Payer: BC Managed Care – PPO | Admitting: Family Medicine

## 2019-07-17 ENCOUNTER — Other Ambulatory Visit: Payer: Self-pay

## 2019-07-17 VITALS — BP 125/77 | HR 77 | Temp 98.4°F | Ht 61.0 in | Wt 228.0 lb

## 2019-07-17 DIAGNOSIS — E559 Vitamin D deficiency, unspecified: Secondary | ICD-10-CM

## 2019-07-17 DIAGNOSIS — E1165 Type 2 diabetes mellitus with hyperglycemia: Secondary | ICD-10-CM | POA: Diagnosis not present

## 2019-07-17 DIAGNOSIS — Z6841 Body Mass Index (BMI) 40.0 and over, adult: Secondary | ICD-10-CM

## 2019-07-17 DIAGNOSIS — F3289 Other specified depressive episodes: Secondary | ICD-10-CM

## 2019-07-17 DIAGNOSIS — Z9189 Other specified personal risk factors, not elsewhere classified: Secondary | ICD-10-CM

## 2019-07-17 MED ORDER — METFORMIN HCL 500 MG PO TABS: 500.0000 mg | ORAL_TABLET | Freq: Every day | ORAL | 0 refills | Status: DC

## 2019-07-17 MED ORDER — BUPROPION HCL ER (SR) 150 MG PO TB12: 150.0000 mg | ORAL_TABLET | Freq: Every day | ORAL | 0 refills | Status: DC

## 2019-07-17 MED ORDER — VITAMIN D (ERGOCALCIFEROL) 1.25 MG (50000 UNIT) PO CAPS: 50000.0000 [IU] | ORAL_CAPSULE | ORAL | 0 refills | Status: DC

## 2019-07-17 NOTE — Progress Notes (Signed)
Chief Complaint:   OBESITY Katrina Beard is here to discuss her progress with her obesity treatment plan along with follow-up of her obesity related diagnoses. Katrina Beard is on the Category 2 Plan with breakfast and lunch options and states she is following her eating plan approximately 90% of the time. Katrina Beard states she is doing 0 minutes 0 times per week.  Today's visit was #: 8 Starting weight: 231 lbs Starting date: 04/03/2019 Today's weight: 228 lbs Today's date: 07/17/2019 Total lbs lost to date: 3 Total lbs lost since last in-office visit: 1  Interim History: Katrina Beard is not eating out at lunch, but she does not always eat the prescribed protein at supper. She has a protein shake sometimes at breakfast. She struggles to eat all of the prescribed food. She tends to skip meals.  Subjective:   1. Type 2 diabetes mellitus with hyperglycemia, without long-term current use of insulin (HCC) Katrina Beard's last A1c was elevated at 7.2. She is on Jardiance and metformin. Her fasting BGs ranges in the 120's.  2. Vitamin D deficiency Katrina Beard's last Vit D level was low at 22.1. She is on weekly prescription Vit D.  3. Other depression, with emotional eating Katrina Beard notes her cravings are well controlled.  4. At risk for malnutrition Katrina Beard is at increased risk for malnutrition due to lack of protein.  Assessment/Plan:   1. Type 2 diabetes mellitus with hyperglycemia, without long-term current use of insulin (HCC) Good blood sugar control is important to decrease the likelihood of diabetic complications such as nephropathy, neuropathy, limb loss, blindness, coronary artery disease, and death. Intensive lifestyle modification including diet, exercise and weight loss are the first line of treatment for diabetes. We will refill metformin for 1 month.  - metFORMIN (GLUCOPHAGE) 500 MG tablet; Take 1 tablet (500 mg total) by mouth daily with breakfast.  Dispense: 30 tablet; Refill: 0  2. Vitamin D deficiency Low  Vitamin D level contributes to fatigue and are associated with obesity, breast, and colon cancer. We will refill prescription Vitamin D for 1 month. [place]elle will follow-up for routine testing of Vitamin D, at least 2-3 times per year to avoid over-replacement.  - Vitamin D, Ergocalciferol, (DRISDOL) 1.25 MG (50000 UNIT) CAPS capsule; Take 1 capsule (50,000 Units total) by mouth every 7 (seven) days.  Dispense: 4 capsule; Refill: 0  3. Other depression, with emotional eating Behavior modification techniques were discussed today to help Katrina Beard deal with her emotional/non-hunger eating behaviors. We will refill bupropion for 1 month. Orders and follow up as documented in patient record.   - buPROPion (WELLBUTRIN SR) 150 MG 12 hr tablet; Take 1 tablet (150 mg total) by mouth daily.  Dispense: 30 tablet; Refill: 0  4. At risk for malnutrition Katrina Beard was given approximately 15 minutes of counseling today regarding prevention of malnutrition and ways to meet macronutrient goals.   5. Class 3 severe obesity with serious comorbidity and body mass index (BMI) of 40.0 to 44.9 in adult, unspecified obesity type ([place]) Katrina Beard is currently in the action stage of change. As such, her goal is to continue with weight loss efforts. She has agreed to keeping a food journal and adhering to recommended goals of 1200-1300 calories and 80 grams of protein daily.   We discussed ways to increase protein. Handout given today: Eating Out.  Exercise goals: No exercise has been prescribed at this time.  Behavioral modification strategies: increasing lean protein intake, meal planning and cooking strategies and planning for success.  Katrina Beard has agreed to follow-up with our clinic in 2 weeks. She was informed of the importance of frequent follow-up visits to maximize her success with intensive lifestyle modifications for her multiple health conditions.   Objective:   Blood pressure 125/77, pulse 77, temperature 98.4 F (36.9  C), temperature source Oral, height 5\' 1"  (1.549 m), weight 228 lb (103.4 kg), SpO2 100 %. Body mass index is 43.08 kg/m.  General: Cooperative, alert, well developed, in no acute distress. HEENT: Conjunctivae and lids unremarkable. Cardiovascular: Regular rhythm.  Lungs: Normal work of breathing. Neurologic: No focal deficits.   Lab Results  Component Value Date   CREATININE 0.79 04/03/2019   BUN 14 04/03/2019   NA 143 04/03/2019   K 3.9 04/03/2019   CL 104 04/03/2019   CO2 22 04/03/2019   Lab Results  Component Value Date   ALT 19 04/03/2019   AST 18 04/03/2019   ALKPHOS 109 04/03/2019   BILITOT 0.3 04/03/2019   Lab Results  Component Value Date   HGBA1C 7.2 (H) 04/03/2019   Lab Results  Component Value Date   INSULIN 11.2 04/03/2019   Lab Results  Component Value Date   TSH 0.575 04/03/2019   Lab Results  Component Value Date   CHOL 153 04/03/2019   HDL 51 04/03/2019   LDLCALC 85 04/03/2019   TRIG 91 04/03/2019   Lab Results  Component Value Date   WBC 6.9 04/03/2019   HGB 11.8 04/03/2019   HCT 37.9 04/03/2019   MCV 81 04/03/2019   PLT 331 04/03/2019   No results found for: IRON, TIBC, FERRITIN  Attestation Statements:   Reviewed by clinician on day of visit: allergies, medications, problem list, medical history, surgical history, family history, social history, and previous encounter notes.   Wilhemena Durie, am acting as Location manager for [employer], FNP-C.  I have reviewed the above documentation for accuracy and completeness, and I agree with the above. -  Georgianne Fick, FNP

## 2019-08-06 ENCOUNTER — Other Ambulatory Visit: Payer: Self-pay

## 2019-08-06 ENCOUNTER — Ambulatory Visit (INDEPENDENT_AMBULATORY_CARE_PROVIDER_SITE_OTHER): Payer: BC Managed Care – PPO | Admitting: Family Medicine

## 2019-08-06 ENCOUNTER — Encounter (INDEPENDENT_AMBULATORY_CARE_PROVIDER_SITE_OTHER): Payer: Self-pay | Admitting: Family Medicine

## 2019-08-06 VITALS — BP 123/75 | HR 78 | Temp 98.5°F | Ht 61.0 in | Wt 226.0 lb

## 2019-08-06 DIAGNOSIS — Z6841 Body Mass Index (BMI) 40.0 and over, adult: Secondary | ICD-10-CM

## 2019-08-06 DIAGNOSIS — Z9189 Other specified personal risk factors, not elsewhere classified: Secondary | ICD-10-CM

## 2019-08-06 DIAGNOSIS — E559 Vitamin D deficiency, unspecified: Secondary | ICD-10-CM

## 2019-08-06 DIAGNOSIS — R748 Abnormal levels of other serum enzymes: Secondary | ICD-10-CM

## 2019-08-06 DIAGNOSIS — E1165 Type 2 diabetes mellitus with hyperglycemia: Secondary | ICD-10-CM | POA: Diagnosis not present

## 2019-08-06 NOTE — Progress Notes (Signed)
Chief Complaint:   OBESITY Katrina Beard is here to discuss her progress with her obesity treatment plan along with follow-up of her obesity related diagnoses. Katrina Beard is on keeping a food journal and adhering to recommended goals of 1200-1300 calories and 80 grams of protein daily and states she is following her eating plan approximately 30% of the time. Katrina Beard states she is doing 0 minutes 0 times per week.  Today's visit was #: 9 Starting weight: 231 lbs Starting date: 04/03/2019 Today's weight: 226 lbs Today's date: 08/06/2019 Total lbs lost to date: 5 Total lbs lost since last in-office visit: 2  Interim History: Katrina Beard is still eating lunch at home versus eating out which has been quite a change for her. She has been successfully dealing with co-worker sabotage about going out to eat lunch.  She is not skipping meals. She has a frozen meal at supper at times.  She is status post RNY gastric bypass (2016) and low weight was 228 lbs.  Subjective:   1. Type 2 diabetes mellitus with hyperglycemia, without long-term current use of insulin ([place]) Katrina Beard's last A1c was 7.2 on 04/03/2019. She is on metformin and Jardiance. Her fasting BGs range between 90 and 116. Lab Results  Component Value Date   HGBA1C 7.2 (H) 04/03/2019   Lab Results  Component Value Date   LDLCALC 85 04/03/2019   CREATININE 0.79 04/03/2019     2. Elevated vitamin B12 level Katrina Beard's B12 was high at 1983 at her last lab check. She was only on multivitamins.  3. Vitamin D deficiency Katrina Beard's last Vit D level was low at 22.1. She is on weekly prescription Vit D.  4. At risk for hypoglycemia Katrina Beard is at increased risk for hypoglycemia due to diagnosis of diabetes mellitus.  Assessment/Plan:   1. Type 2 diabetes mellitus with hyperglycemia, without long-term current use of insulin (HCC) Good blood sugar control is important to decrease the likelihood of diabetic complications such as nephropathy, neuropathy, limb loss,  blindness, coronary artery disease, and death. Intensive lifestyle modification including diet, exercise and weight loss are the first line of treatment for diabetes. We will check labs today.  - Comprehensive metabolic panel - Hemoglobin A1c  2. Elevated vitamin B12 level We will check labs today, and will follow up.  - Vitamin B12  3. Vitamin D deficiency Low Vitamin D level contributes to fatigue and are associated with obesity, breast, and colon cancer. Katrina Beard agreed to continue taking prescription Vitamin D 50,000 IU every week and will follow-up for routine testing of Vitamin D, at least 2-3 times per year to avoid over-replacement. We will check labs today.  - VITAMIN D 25 Hydroxy (Vit-D Deficiency, Fractures)  4. At risk for hypoglycemia Katrina Beard was given approximately 15 minutes of counseling today regarding prevention of hypoglycemia. She was advised of symptoms of hypoglycemia. Katrina Beard was instructed to avoid skipping meals, eat regular protein rich meals and schedule low calorie snacks as needed.   Repetitive spaced learning was employed today to elicit superior memory formation and behavioral change  5. Class 3 severe obesity with serious comorbidity and body mass index (BMI) of 40.0 to 44.9 in adult, unspecified obesity type (HCC) Katrina Beard is currently in the action stage of change. As such, her goal is to continue with weight loss efforts. She has agreed to the Category 2 Plan.   Katrina Beard is to add 12-15 grams of protein if she is having a microwave meal at supper.  Exercise goals: No  exercise has been prescribed at this time.  Behavioral modification strategies: no skipping meals.  Katrina Beard has agreed to follow-up with our clinic in 3 weeks. She was informed of the importance of frequent follow-up visits to maximize her success with intensive lifestyle modifications for her multiple health conditions.   Katrina Beard was informed we would discuss her lab results at her next visit unless  there is a critical issue that needs to be addressed sooner. Katrina Beard agreed to keep her next visit at the agreed upon time to discuss these results.  Objective:   Blood pressure 123/75, pulse 78, temperature 98.5 F (36.9 C), temperature source Oral, height 5\' 1"  (1.549 m), weight 226 lb (102.5 kg), SpO2 100 %. Body mass index is 42.7 kg/m.  General: Cooperative, alert, well developed, in no acute distress. HEENT: Conjunctivae and lids unremarkable. Cardiovascular: Regular rhythm.  Lungs: Normal work of breathing. Neurologic: No focal deficits.   Lab Results  Component Value Date   CREATININE 0.79 04/03/2019   BUN 14 04/03/2019   NA 143 04/03/2019   K 3.9 04/03/2019   CL 104 04/03/2019   CO2 22 04/03/2019   Lab Results  Component Value Date   ALT 19 04/03/2019   AST 18 04/03/2019   ALKPHOS 109 04/03/2019   BILITOT 0.3 04/03/2019   Lab Results  Component Value Date   HGBA1C 7.2 (H) 04/03/2019   Lab Results  Component Value Date   INSULIN 11.2 04/03/2019   Lab Results  Component Value Date   TSH 0.575 04/03/2019   Lab Results  Component Value Date   CHOL 153 04/03/2019   HDL 51 04/03/2019   LDLCALC 85 04/03/2019   TRIG 91 04/03/2019   Lab Results  Component Value Date   WBC 6.9 04/03/2019   HGB 11.8 04/03/2019   HCT 37.9 04/03/2019   MCV 81 04/03/2019   PLT 331 04/03/2019   No results found for: IRON, TIBC, FERRITIN  Attestation Statements:   Reviewed by clinician on day of visit: allergies, medications, problem list, medical history, surgical history, family history, social history, and previous encounter notes.   Wilhemena Durie, am acting as Location manager for [employer], FNP-C.  I have reviewed the above documentation for accuracy and completeness, and I agree with the above. -  Georgianne Fick, FNP

## 2019-08-07 LAB — COMPREHENSIVE METABOLIC PANEL
ALT: 19 IU/L (ref 0–32)
AST: 18 IU/L (ref 0–40)
Albumin/Globulin Ratio: 1.6 (ref 1.2–2.2)
Albumin: 4.2 g/dL (ref 3.8–4.9)
Alkaline Phosphatase: 108 IU/L (ref 48–121)
BUN/Creatinine Ratio: 15 (ref 9–23)
BUN: 14 mg/dL (ref 6–24)
Bilirubin Total: 0.4 mg/dL (ref 0.0–1.2)
CO2: 21 mmol/L (ref 20–29)
Calcium: 9 mg/dL (ref 8.7–10.2)
Chloride: 103 mmol/L (ref 96–106)
Creatinine, Ser: 0.95 mg/dL (ref 0.57–1.00)
GFR calc Af Amer: 77 mL/min/{1.73_m2} (ref >59)
GFR calc non Af Amer: 67 mL/min/{1.73_m2} (ref >59)
Globulin, Total: 2.7 g/dL (ref 1.5–4.5)
Glucose: 87 mg/dL (ref 65–99)
Potassium: 4.3 mmol/L (ref 3.5–5.2)
Sodium: 140 mmol/L (ref 134–144)
Total Protein: 6.9 g/dL (ref 6.0–8.5)

## 2019-08-07 LAB — HEMOGLOBIN A1C
Est. average glucose Bld gHb Est-mCnc: 134 mg/dL
Hgb A1c MFr Bld: 6.3 % — ABNORMAL HIGH (ref 4.8–5.6)

## 2019-08-07 LAB — VITAMIN B12: Vitamin B-12: 1927 pg/mL — ABNORMAL HIGH (ref 232–1245)

## 2019-08-07 LAB — VITAMIN D 25 HYDROXY (VIT D DEFICIENCY, FRACTURES): Vit D, 25-Hydroxy: 43.6 ng/mL (ref 30.0–100.0)

## 2019-08-28 ENCOUNTER — Ambulatory Visit (INDEPENDENT_AMBULATORY_CARE_PROVIDER_SITE_OTHER): Payer: BC Managed Care – PPO | Admitting: Family Medicine

## 2019-08-30 ENCOUNTER — Encounter (INDEPENDENT_AMBULATORY_CARE_PROVIDER_SITE_OTHER): Payer: Self-pay | Admitting: Family Medicine

## 2019-08-30 ENCOUNTER — Other Ambulatory Visit: Payer: Self-pay

## 2019-08-30 ENCOUNTER — Ambulatory Visit (INDEPENDENT_AMBULATORY_CARE_PROVIDER_SITE_OTHER): Payer: BC Managed Care – PPO | Admitting: Family Medicine

## 2019-08-30 VITALS — BP 131/77 | HR 76 | Temp 98.1°F | Ht 61.0 in | Wt 225.0 lb

## 2019-08-30 DIAGNOSIS — Z9189 Other specified personal risk factors, not elsewhere classified: Secondary | ICD-10-CM | POA: Diagnosis not present

## 2019-08-30 DIAGNOSIS — E559 Vitamin D deficiency, unspecified: Secondary | ICD-10-CM | POA: Diagnosis not present

## 2019-08-30 DIAGNOSIS — F3289 Other specified depressive episodes: Secondary | ICD-10-CM

## 2019-08-30 DIAGNOSIS — Z6841 Body Mass Index (BMI) 40.0 and over, adult: Secondary | ICD-10-CM

## 2019-08-30 DIAGNOSIS — E1169 Type 2 diabetes mellitus with other specified complication: Secondary | ICD-10-CM | POA: Diagnosis not present

## 2019-08-30 MED ORDER — METFORMIN HCL 500 MG PO TABS: 500.0000 mg | ORAL_TABLET | Freq: Every day | ORAL | 0 refills | Status: DC

## 2019-08-30 MED ORDER — OZEMPIC (0.25 OR 0.5 MG/DOSE) 2 MG/1.5ML ~~LOC~~ SOPN: 0.2500 mg | PEN_INJECTOR | SUBCUTANEOUS | 0 refills | Status: DC

## 2019-08-30 MED ORDER — VITAMIN D (ERGOCALCIFEROL) 1.25 MG (50000 UNIT) PO CAPS: 50000.0000 [IU] | ORAL_CAPSULE | ORAL | 0 refills | Status: DC

## 2019-08-30 MED ORDER — BUPROPION HCL ER (SR) 150 MG PO TB12: 150.0000 mg | ORAL_TABLET | Freq: Every day | ORAL | 0 refills | Status: DC

## 2019-09-03 NOTE — Progress Notes (Signed)
Chief Complaint:   OBESITY Katrina Beard is here to discuss her progress with her obesity treatment plan along with follow-up of her obesity related diagnoses. Katrina Beard is on the Category 2 Plan and states she is following her eating plan approximately 0% of the time. Katrina Beard states she is doing 0 minutes 0 times per week.  Today's visit was #: 10 Starting weight: 231 lbs Starting date: 04/03/2019 Today's weight: 225 lbs Today's date: 08/30/2019 Total lbs lost to date: 6 Total lbs lost since last in-office visit: 1  Interim History: Katrina Beard's issues with her two  foster children have kept her from following the plan. She is struggling with cravings after supper. She is not eating out much.  Subjective:   1. Vitamin D deficiency Katrina Beard's last Vit D level was low at 43.6. She is on prescription Vit D. I discussed labs with the patient today.  2. Type 2 diabetes mellitus without complication, without long-term current use of insulin ([place]) Katrina Beard's diabetes mellitus is well controlled on jardiance and metformin. Last A1c was 6.3 and is down from 7.2. She denies hypoglycemia. Her fasting CBGs range between 97 and 116. I discussed labs with the patient today.  Lab Results  Component Value Date   HGBA1C 6.3 (H) 08/06/2019   HGBA1C 7.2 (H) 04/03/2019   Lab Results  Component Value Date   LDLCALC 85 04/03/2019   CREATININE 0.95 08/06/2019   Lab Results  Component Value Date   INSULIN 11.2 04/03/2019   3. Other depression, with emotional eating Katrina Beard notes stress eating due to foster kids, she struggles with night eating.  4. At risk for osteoporosis Chariti is at higher risk of osteopenia and osteoporosis due to Vitamin D deficiency.   Assessment/Plan:   1. Vitamin D deficiency Low Vitamin D level contributes to fatigue and are associated with obesity, breast, and colon cancer. We will refill prescription Vitamin D for 1 month. Shelah will follow-up for routine testing of Vitamin D, at least  2-3 times per year to avoid over-replacement.   - Vitamin D, Ergocalciferol, (DRISDOL) 1.25 MG (50000 UNIT) CAPS capsule; Take 1 capsule (50,000 Units total) by mouth every 7 (seven) days.  Dispense: 4 capsule; Refill: 0  2. Type 2 diabetes mellitus without complication, without long-term current use of insulin ([place])  Dayanira agreed to start Ozempic 0.25 mg SubQ once weekly with no refills, and we will refill metformin for 1 month.  - metFORMIN (GLUCOPHAGE) 500 MG tablet; Take 1 tablet (500 mg total) by mouth daily with breakfast.  Dispense: 30 tablet; Refill: 0 - Semaglutide,0.25 or 0.5MG /DOS, (OZEMPIC, 0.25 OR 0.5 MG/DOSE,) 2 MG/1.5ML SOPN; Inject 0.1875 mLs (0.25 mg total) into the skin once a week.  Dispense: 1 pen; Refill: 0  3. Other depression, with emotional eating Behavior modification techniques were discussed today to help Katrina Beard deal with her emotional/non-hunger eating behaviors. We will refill bupropion for 1 month. Orders and follow up as documented in patient record.   - buPROPion (WELLBUTRIN SR) 150 MG 12 hr tablet; Take 1 tablet (150 mg total) by mouth daily.  Dispense: 30 tablet; Refill: 0  4. At risk for osteoporosis Katrina Beard was given approximately 15 minutes of osteoporosis prevention counseling today. Katrina Beard is at risk for osteopenia and osteoporosis due to her Vitamin D deficiency. She was encouraged to take her Vitamin D and follow her higher calcium diet and increase strengthening exercise to help strengthen her bones and decrease her risk of osteopenia and osteoporosis.  Repetitive  spaced learning was employed today to elicit superior memory formation and behavioral change.  5. Class 3 severe obesity with serious comorbidity and body mass index (BMI) of 40.0 to 44.9 in adult, unspecified obesity type ([place]) Katrina Beard is currently in the action stage of change. As such, her goal is to continue with weight loss efforts. She has agreed to the Category 2 Plan.   Exercise goals: All  adults should avoid inactivity. Some physical activity is better than none, and adults who participate in any amount of physical activity gain some health benefits.  Behavioral modification strategies: increasing lean protein intake and decreasing simple carbohydrates.  Katrina Beard has agreed to follow-up with our clinic in 3 weeks. She was informed of the importance of frequent follow-up visits to maximize her success with intensive lifestyle modifications for her multiple health conditions.   Objective:   Blood pressure 131/77, pulse 76, temperature 98.1 F (36.7 C), temperature source Oral, height 5\' 1"  (1.549 m), weight 225 lb (102.1 kg), SpO2 100 %. Body mass index is 42.51 kg/m.  General: Cooperative, alert, well developed, in no acute distress. HEENT: Conjunctivae and lids unremarkable. Cardiovascular: Regular rhythm.  Lungs: Normal work of breathing. Neurologic: No focal deficits.   Lab Results  Component Value Date   CREATININE 0.95 08/06/2019   BUN 14 08/06/2019   NA 140 08/06/2019   K 4.3 08/06/2019   CL 103 08/06/2019   CO2 21 08/06/2019   Lab Results  Component Value Date   ALT 19 08/06/2019   AST 18 08/06/2019   ALKPHOS 108 08/06/2019   BILITOT 0.4 08/06/2019   Lab Results  Component Value Date   HGBA1C 6.3 (H) 08/06/2019   HGBA1C 7.2 (H) 04/03/2019   Lab Results  Component Value Date   INSULIN 11.2 04/03/2019   Lab Results  Component Value Date   TSH 0.575 04/03/2019   Lab Results  Component Value Date   CHOL 153 04/03/2019   HDL 51 04/03/2019   LDLCALC 85 04/03/2019   TRIG 91 04/03/2019   Lab Results  Component Value Date   WBC 6.9 04/03/2019   HGB 11.8 04/03/2019   HCT 37.9 04/03/2019   MCV 81 04/03/2019   PLT 331 04/03/2019   No results found for: IRON, TIBC, FERRITIN  Attestation Statements:   Reviewed by clinician on day of visit: allergies, medications, problem list, medical history, surgical history, family history, social history,  and previous encounter notes.   Wilhemena Durie, am acting as Location manager for [employer], FNP-C.  I have reviewed the above documentation for accuracy and completeness, and I agree with the above. -  Georgianne Fick, FNP

## 2019-09-04 ENCOUNTER — Encounter (INDEPENDENT_AMBULATORY_CARE_PROVIDER_SITE_OTHER): Payer: Self-pay | Admitting: Family Medicine

## 2019-09-20 ENCOUNTER — Other Ambulatory Visit: Payer: Self-pay

## 2019-09-20 ENCOUNTER — Encounter (INDEPENDENT_AMBULATORY_CARE_PROVIDER_SITE_OTHER): Payer: Self-pay | Admitting: Family Medicine

## 2019-09-20 ENCOUNTER — Ambulatory Visit (INDEPENDENT_AMBULATORY_CARE_PROVIDER_SITE_OTHER): Payer: BC Managed Care – PPO | Admitting: Family Medicine

## 2019-09-20 VITALS — BP 148/84 | HR 75 | Temp 98.2°F | Ht 61.0 in | Wt 222.0 lb

## 2019-09-20 DIAGNOSIS — F3289 Other specified depressive episodes: Secondary | ICD-10-CM | POA: Diagnosis not present

## 2019-09-20 DIAGNOSIS — Z9189 Other specified personal risk factors, not elsewhere classified: Secondary | ICD-10-CM | POA: Diagnosis not present

## 2019-09-20 DIAGNOSIS — E559 Vitamin D deficiency, unspecified: Secondary | ICD-10-CM | POA: Diagnosis not present

## 2019-09-20 DIAGNOSIS — E66813 Obesity, class 3: Secondary | ICD-10-CM

## 2019-09-20 DIAGNOSIS — I10 Essential (primary) hypertension: Secondary | ICD-10-CM

## 2019-09-20 DIAGNOSIS — I152 Hypertension secondary to endocrine disorders: Secondary | ICD-10-CM

## 2019-09-20 DIAGNOSIS — E1159 Type 2 diabetes mellitus with other circulatory complications: Secondary | ICD-10-CM | POA: Diagnosis not present

## 2019-09-20 DIAGNOSIS — Z6841 Body Mass Index (BMI) 40.0 and over, adult: Secondary | ICD-10-CM

## 2019-09-20 DIAGNOSIS — E1169 Type 2 diabetes mellitus with other specified complication: Secondary | ICD-10-CM

## 2019-09-20 MED ORDER — OZEMPIC (0.25 OR 0.5 MG/DOSE) 2 MG/1.5ML ~~LOC~~ SOPN: 0.5000 mg | PEN_INJECTOR | SUBCUTANEOUS | 0 refills | Status: DC

## 2019-09-20 MED ORDER — VITAMIN D (ERGOCALCIFEROL) 1.25 MG (50000 UNIT) PO CAPS: 50000.0000 [IU] | ORAL_CAPSULE | ORAL | 0 refills | Status: DC

## 2019-09-20 MED ORDER — BUPROPION HCL ER (SR) 150 MG PO TB12: 150.0000 mg | ORAL_TABLET | ORAL | 0 refills | Status: DC

## 2019-09-20 MED ORDER — METFORMIN HCL 500 MG PO TABS: 500.0000 mg | ORAL_TABLET | Freq: Every day | ORAL | 0 refills | Status: DC

## 2019-09-25 NOTE — Progress Notes (Signed)
Chief Complaint:   OBESITY Katrina Beard is here to discuss her progress with her obesity treatment plan along with follow-up of her obesity related diagnoses. Katrina Beard is on the Category 2 Plan and states she is following her eating plan approximately 60% of the time. Katrina Beard states she is doing 0 minutes 0 times per week.  Today's visit was #: 11 Starting weight: 231 lbs Starting date: 04/03/2019 Today's weight: 222 lbs Today's date: 09/20/2019 Total lbs lost to date: 9 Total lbs lost since last in-office visit: 3  Interim History: Katrina Beard is portion controlling her dinner better using portioned trays. She is getting protein in at all of her meals. She notices Ozempic has helped with her appetite.  Subjective:   1. Type 2 diabetes mellitus with other specified complication, without long-term current use of insulin (HCC) Katrina Beard's diabetes mellitus is well controlled. Last A1c was 6.3. She is on metformin and Jardiance. We started Ozempic at her last office visit, and she is tolerating this well. She denies nausea or constipation.  Lab Results  Component Value Date   HGBA1C 6.3 (H) 08/06/2019   HGBA1C 7.2 (H) 04/03/2019   Lab Results  Component Value Date   LDLCALC 85 04/03/2019   CREATININE 0.95 08/06/2019   Lab Results  Component Value Date   INSULIN 11.2 04/03/2019   2. Vitamin D deficiency Katrina Beard's last Vit D level was low at 43.6. she is on weekly prescription Vit D.  3. Essential hypertension Katrina Beard's blood pressure is elevated today. Her primary care physician told her she could trial off of amlodipine. She will check her blood pressure at home. Cardiovascular ROS: no chest pain or dyspnea on exertion.  BP Readings from Last 3 Encounters:  09/20/19 (!) 148/84  08/30/19 131/77  08/06/19 123/75   Lab Results  Component Value Date   CREATININE 0.95 08/06/2019   CREATININE 0.79 04/03/2019   CREATININE 1.70 (H) 07/24/2016   4. Other depression, with emotional eating Katrina Beard  notes her cravings are  Well controlled, especially with added Ozempic.  5. At risk for constipation Katrina Beard is at increased risk for constipation due to increased dose of Ozempic. Katrina Beard denies hard, infrequent stools currently.   Assessment/Plan:   1. Type 2 diabetes mellitus with other specified complication, without long-term current use of insulin (HCC) Good blood sugar control is important to decrease the likelihood of diabetic complications such as nephropathy, neuropathy, limb loss, blindness, coronary artery disease, and death. Intensive lifestyle modification including diet, exercise and weight loss are the first line of treatment for diabetes. Katrina Beard agreed to increase Ozempic to 0.5 mg weekly with no refills, and we will refill metformin for 1 month.    - metFORMIN (GLUCOPHAGE) 500 MG tablet; Take 1 tablet (500 mg total) by mouth daily with breakfast.  Dispense: 30 tablet; Refill: 0 - Semaglutide,0.25 or 0.5MG /DOS, (OZEMPIC, 0.25 OR 0.5 MG/DOSE,) 2 MG/1.5ML SOPN; Inject 0.375 mLs (0.5 mg total) into the skin once a week.  Dispense: 1 pen; Refill: 0  2. Vitamin D deficiency Low Vitamin D level contributes to fatigue and are associated with obesity, breast, and colon cancer. We will refill prescription Vitamin D for 1 month. Katrina Beard will follow-up for routine testing of Vitamin D, at least 2-3 times per year to avoid over-replacement.  - Vitamin D, Ergocalciferol, (DRISDOL) 1.25 MG (50000 UNIT) CAPS capsule; Take 1 capsule (50,000 Units total) by mouth every 7 (seven) days.  Dispense: 4 capsule; Refill: 0  3. Essential hypertension Katrina Beard is  working on healthy weight loss and exercise to improve blood pressure control. We will continue to monitor. We may need to add amlodipine back.  4. Other depression, with emotional eating Behavior modification techniques were discussed today to help Katrina Beard deal with her emotional/non-hunger eating behaviors. We will refill bupropion for 1 month. Orders  and follow up as documented in patient record.   - buPROPion (WELLBUTRIN SR) 150 MG 12 hr tablet; Take 1 tablet (150 mg total) by mouth every morning.  Dispense: 30 tablet; Refill: 0  5. At risk for constipation Katrina Beard was given approximately 15 minutes of counseling today regarding prevention of constipation. She was encouraged to increase water and fiber intake.   6. Class 3 severe obesity with serious comorbidity and body mass index (BMI) of 40.0 to 44.9 in adult, unspecified obesity type ([place]) Katrina Beard is currently in the action stage of change. As such, her goal is to continue with weight loss efforts. She has agreed to the Category 2 Plan.   Exercise goals: All adults should avoid inactivity. Some physical activity is better than none, and adults who participate in any amount of physical activity gain some health benefits.  Behavioral modification strategies: decreasing simple carbohydrates, better snacking choices and planning for success.  Katrina Beard has agreed to follow-up with our clinic in 4 weeks. She was informed of the importance of frequent follow-up visits to maximize her success with intensive lifestyle modifications for her multiple health conditions.   Objective:   Blood pressure (!) 148/84, pulse 75, temperature 98.2 F (36.8 C), temperature source Oral, height 5\' 1"  (1.549 m), weight 222 lb (100.7 kg), SpO2 98 %. Body mass index is 41.95 kg/m.  General: Cooperative, alert, well developed, in no acute distress. HEENT: Conjunctivae and lids unremarkable. Cardiovascular: Regular rhythm.  Lungs: Normal work of breathing. Neurologic: No focal deficits.   Lab Results  Component Value Date   CREATININE 0.95 08/06/2019   BUN 14 08/06/2019   NA 140 08/06/2019   K 4.3 08/06/2019   CL 103 08/06/2019   CO2 21 08/06/2019   Lab Results  Component Value Date   ALT 19 08/06/2019   AST 18 08/06/2019   ALKPHOS 108 08/06/2019   BILITOT 0.4 08/06/2019   Lab Results  Component  Value Date   HGBA1C 6.3 (H) 08/06/2019   HGBA1C 7.2 (H) 04/03/2019   Lab Results  Component Value Date   INSULIN 11.2 04/03/2019   Lab Results  Component Value Date   TSH 0.575 04/03/2019   Lab Results  Component Value Date   CHOL 153 04/03/2019   HDL 51 04/03/2019   LDLCALC 85 04/03/2019   TRIG 91 04/03/2019   Lab Results  Component Value Date   WBC 6.9 04/03/2019   HGB 11.8 04/03/2019   HCT 37.9 04/03/2019   MCV 81 04/03/2019   PLT 331 04/03/2019   No results found for: IRON, TIBC, FERRITIN  Attestation Statements:   Reviewed by clinician on day of visit: allergies, medications, problem list, medical history, surgical history, family history, social history, and previous encounter notes.   Wilhemena Durie, am acting as Location manager for [employer], FNP-C.  I have reviewed the above documentation for accuracy and completeness, and I agree with the above. -  Georgianne Fick, FNP

## 2019-09-26 ENCOUNTER — Encounter (INDEPENDENT_AMBULATORY_CARE_PROVIDER_SITE_OTHER): Payer: Self-pay | Admitting: Family Medicine

## 2019-10-17 ENCOUNTER — Ambulatory Visit (INDEPENDENT_AMBULATORY_CARE_PROVIDER_SITE_OTHER): Payer: BC Managed Care – PPO | Admitting: Adult Health

## 2019-10-25 ENCOUNTER — Encounter (INDEPENDENT_AMBULATORY_CARE_PROVIDER_SITE_OTHER): Payer: Self-pay | Admitting: Adult Health

## 2019-10-25 ENCOUNTER — Other Ambulatory Visit: Payer: Self-pay

## 2019-10-25 ENCOUNTER — Ambulatory Visit (INDEPENDENT_AMBULATORY_CARE_PROVIDER_SITE_OTHER): Payer: BC Managed Care – PPO | Admitting: Adult Health

## 2019-10-25 VITALS — BP 145/86 | HR 71 | Temp 98.0°F | Ht 61.0 in | Wt 218.0 lb

## 2019-10-25 DIAGNOSIS — E1169 Type 2 diabetes mellitus with other specified complication: Secondary | ICD-10-CM

## 2019-10-25 DIAGNOSIS — E1159 Type 2 diabetes mellitus with other circulatory complications: Secondary | ICD-10-CM

## 2019-10-25 DIAGNOSIS — Z9189 Other specified personal risk factors, not elsewhere classified: Secondary | ICD-10-CM

## 2019-10-25 DIAGNOSIS — F3289 Other specified depressive episodes: Secondary | ICD-10-CM | POA: Diagnosis not present

## 2019-10-25 DIAGNOSIS — I10 Essential (primary) hypertension: Secondary | ICD-10-CM

## 2019-10-25 DIAGNOSIS — E559 Vitamin D deficiency, unspecified: Secondary | ICD-10-CM | POA: Diagnosis not present

## 2019-10-25 DIAGNOSIS — E119 Type 2 diabetes mellitus without complications: Secondary | ICD-10-CM

## 2019-10-25 DIAGNOSIS — I152 Hypertension secondary to endocrine disorders: Secondary | ICD-10-CM

## 2019-10-25 DIAGNOSIS — Z6841 Body Mass Index (BMI) 40.0 and over, adult: Secondary | ICD-10-CM

## 2019-10-25 MED ORDER — VITAMIN D (ERGOCALCIFEROL) 1.25 MG (50000 UNIT) PO CAPS: 50000.0000 [IU] | ORAL_CAPSULE | ORAL | 0 refills | Status: DC

## 2019-10-25 MED ORDER — METFORMIN HCL 500 MG PO TABS: 500.0000 mg | ORAL_TABLET | Freq: Every day | ORAL | 0 refills | Status: DC

## 2019-10-25 MED ORDER — BUPROPION HCL ER (SR) 150 MG PO TB12: 150.0000 mg | ORAL_TABLET | ORAL | 0 refills | Status: DC

## 2019-10-30 NOTE — Progress Notes (Signed)
Chief Complaint:   OBESITY Katrina Beard is here to discuss her progress with her obesity treatment plan along with follow-up of her obesity related diagnoses. Katrina Beard is on the Category 2 Plan and states she is following her eating plan approximately 50% of the time. Katrina Beard states she is walking 1-2 miles 3-4 times per week.  Today's visit was #: 12 Starting weight: 231 lbs Starting date: 04/03/2019 Today's weight: 218 lbs Today's date: 10/25/2019 Total lbs lost to date: 13 Total lbs lost since last in-office visit: 4  Interim History: Katrina Beard will eat off plan when eating with her foster children, ages 49 and 68. She feels that her hunger levels are well controlled on the Category 2 meal plan and current medication regimen.  Subjective:   Vitamin D deficiency. Katrina Beard is on Ergocalciferol. No nausea, vomiting, or muscle weakness.    Ref. Range 08/06/2019 11:55  Vitamin D, 25-Hydroxy Latest Ref Range: 30.0 - 100.0 ng/mL 43.6   Type 2 diabetes mellitus with other specified complication, without long-term current use of insulin ([place]). Katrina Beard is on metformin 500 mg daily, empagliflozin 10 mg daily, and recently started on Semaglutide 0.5 mg once weekly injection. She denies constipation. Ambulatory fasting blood glucose levels are in the range of 100 to 120's.  Lab Results  Component Value Date   HGBA1C 6.3 (H) 08/06/2019   HGBA1C 7.2 (H) 04/03/2019   Lab Results  Component Value Date   LDLCALC 85 04/03/2019   CREATININE 0.95 08/06/2019   Lab Results  Component Value Date   INSULIN 11.2 04/03/2019   Hypertension associated with type 2 diabetes mellitus ([place]). Blood pressure is slightly elevated at 145/86. Katrina Beard is on losartan 100 mg daily. Ambulatory blood pressure - systolic blood pressures [id number] with diastolic blood pressure 70 to 80's. Her PCP stopped amlodipine 10 mg in early July 2021. She denies cardiac symptoms.  BP Readings from Last 3 Encounters:  10/25/19 (!) 145/86    09/20/19 (!) 148/84  08/30/19 131/77   Lab Results  Component Value Date   CREATININE 0.95 08/06/2019   CREATININE 0.79 04/03/2019   CREATININE 1.70 (H) 07/24/2016   Other depression, with emotional eating. Katrina Beard is struggling with emotional eating and using food for comfort to the extent that it is negatively impacting her health. She has been working on behavior modification techniques to help reduce her emotional eating and has been somewhat successful. She shows no sign of suicidal or homicidal ideations. Katrina Beard denies increased emotional eating. Blood pressure is slightly elevated today - is only on losartan 100 mg daily; CCB was recently stopped by her PCP. She denies cardiac symptoms. She is monitoring her blood pressure closely.  At risk for heart disease. Katrina Beard is at a higher than average risk for cardiovascular disease due to hypertension, hyperlipidemia, type II diabetes mellitus, and obesity.   Assessment/Plan:   Vitamin D deficiency. Low Vitamin D level contributes to fatigue and are associated with obesity, breast, and colon cancer. She was given a refill on her Vitamin D, Ergocalciferol, (DRISDOL) 1.25 MG (50000 UNIT) CAPS capsule every week #4 with 0 refills and will follow-up for routine testing of Vitamin D in September 2021.   Type 2 diabetes mellitus with other specified complication, without long-term current use of insulin ([place]). Good blood sugar control is important to decrease the likelihood of diabetic complications such as nephropathy, neuropathy, limb loss, blindness, coronary artery disease, and death. Intensive lifestyle modification including diet, exercise and weight loss are the  first line of treatment for diabetes. Refill was given for metFORMIN (GLUCOPHAGE) 500 MG tablet #30 with 0 refills. Labs will be checked in September 2021.  Hypertension associated with type 2 diabetes mellitus ([place]). Katrina Beard is working on healthy weight loss and exercise to improve blood  pressure control. We will watch for signs of hypotension as she continues her lifestyle modifications. Will continue to monitor blood pressure closely. She will continue ARB as directed.   Other depression, with emotional eating. Behavior modification techniques were discussed today to help Katrina Beard deal with her emotional/non-hunger eating behaviors.  Orders and follow up as documented in patient record. Refill was given for buPROPion (WELLBUTRIN SR) 150 MG 12 hr tablet daily #30 with 0 refills.  At risk for heart disease. Katrina Beard was given approximately 15 minutes of coronary artery disease prevention counseling today. She is 59 y.o. female and has risk factors for heart disease including obesity. We discussed intensive lifestyle modifications today with an emphasis on specific weight loss instructions and strategies.   Repetitive spaced learning was employed today to elicit superior memory formation and behavioral change.  Class 3 severe obesity with serious comorbidity and body mass index (BMI) of 40.0 to 44.9 in adult, unspecified obesity type (Katrina Beard).  Katrina Beard is currently in the action stage of change. As such, her goal is to continue with weight loss efforts. She has agreed to the Category 2 Plan.   Exercise goals: Katrina Beard will continue her current exercise regimen.   Behavioral modification strategies: increasing lean protein intake, increasing water intake, decreasing eating out, meal planning and cooking strategies and planning for success.  Katrina Beard has agreed to follow-up with our clinic in 4 weeks. She was informed of the importance of frequent follow-up visits to maximize her success with intensive lifestyle modifications for her multiple health conditions.   Objective:   Blood pressure (!) 145/86, pulse 71, temperature 98 F (36.7 C), temperature source Oral, height 5\' 1"  (1.549 m), weight 218 lb (98.9 kg), SpO2 100 %. Body mass index is 41.19 kg/m.  General: Cooperative, alert, well  developed, in no acute distress. HEENT: Conjunctivae and lids unremarkable. Cardiovascular: Regular rhythm.  Lungs: Normal work of breathing. Neurologic: No focal deficits.   Lab Results  Component Value Date   CREATININE 0.95 08/06/2019   BUN 14 08/06/2019   NA 140 08/06/2019   K 4.3 08/06/2019   CL 103 08/06/2019   CO2 21 08/06/2019   Lab Results  Component Value Date   ALT 19 08/06/2019   AST 18 08/06/2019   ALKPHOS 108 08/06/2019   BILITOT 0.4 08/06/2019   Lab Results  Component Value Date   HGBA1C 6.3 (H) 08/06/2019   HGBA1C 7.2 (H) 04/03/2019   Lab Results  Component Value Date   INSULIN 11.2 04/03/2019   Lab Results  Component Value Date   TSH 0.575 04/03/2019   Lab Results  Component Value Date   CHOL 153 04/03/2019   HDL 51 04/03/2019   LDLCALC 85 04/03/2019   TRIG 91 04/03/2019   Lab Results  Component Value Date   WBC 6.9 04/03/2019   HGB 11.8 04/03/2019   HCT 37.9 04/03/2019   MCV 81 04/03/2019   PLT 331 04/03/2019   No results found for: IRON, TIBC, FERRITIN  Attestation Statements:   Reviewed by clinician on day of visit: allergies, medications, problem list, medical history, surgical history, family history, social history, and previous encounter notes.  I, Michaelene Song, am acting as Location manager for [employer],  NP-C   I have reviewed the above documentation for accuracy and completeness, and I agree with the above. -  Esaw Grandchild, NP

## 2019-11-09 ENCOUNTER — Other Ambulatory Visit (INDEPENDENT_AMBULATORY_CARE_PROVIDER_SITE_OTHER): Payer: Self-pay | Admitting: Family Medicine

## 2019-11-09 DIAGNOSIS — E1169 Type 2 diabetes mellitus with other specified complication: Secondary | ICD-10-CM

## 2019-11-13 ENCOUNTER — Encounter (INDEPENDENT_AMBULATORY_CARE_PROVIDER_SITE_OTHER): Payer: Self-pay

## 2019-11-13 NOTE — Telephone Encounter (Signed)
My chart message sent to pt.

## 2019-11-14 ENCOUNTER — Other Ambulatory Visit (INDEPENDENT_AMBULATORY_CARE_PROVIDER_SITE_OTHER): Payer: Self-pay | Admitting: Adult Health

## 2019-11-14 DIAGNOSIS — E1169 Type 2 diabetes mellitus with other specified complication: Secondary | ICD-10-CM

## 2019-11-14 MED ORDER — OZEMPIC (0.25 OR 0.5 MG/DOSE) 2 MG/1.5ML ~~LOC~~ SOPN: 0.5000 mg | PEN_INJECTOR | SUBCUTANEOUS | 0 refills | Status: DC

## 2019-11-26 ENCOUNTER — Encounter (INDEPENDENT_AMBULATORY_CARE_PROVIDER_SITE_OTHER): Payer: Self-pay | Admitting: Family Medicine

## 2019-11-26 ENCOUNTER — Other Ambulatory Visit: Payer: Self-pay

## 2019-11-26 ENCOUNTER — Ambulatory Visit (INDEPENDENT_AMBULATORY_CARE_PROVIDER_SITE_OTHER): Payer: BC Managed Care – PPO | Admitting: Family Medicine

## 2019-11-26 VITALS — BP 127/75 | HR 68 | Temp 98.0°F | Ht 61.0 in | Wt 213.0 lb

## 2019-11-26 DIAGNOSIS — Z9189 Other specified personal risk factors, not elsewhere classified: Secondary | ICD-10-CM

## 2019-11-26 DIAGNOSIS — E559 Vitamin D deficiency, unspecified: Secondary | ICD-10-CM

## 2019-11-26 DIAGNOSIS — Z6841 Body Mass Index (BMI) 40.0 and over, adult: Secondary | ICD-10-CM

## 2019-11-26 DIAGNOSIS — F3289 Other specified depressive episodes: Secondary | ICD-10-CM

## 2019-11-26 DIAGNOSIS — E1169 Type 2 diabetes mellitus with other specified complication: Secondary | ICD-10-CM

## 2019-11-26 MED ORDER — BUPROPION HCL ER (SR) 150 MG PO TB12: 150.0000 mg | ORAL_TABLET | ORAL | 0 refills | Status: DC

## 2019-11-26 MED ORDER — OZEMPIC (0.25 OR 0.5 MG/DOSE) 2 MG/1.5ML ~~LOC~~ SOPN: 0.5000 mg | PEN_INJECTOR | SUBCUTANEOUS | 0 refills | Status: DC

## 2019-11-26 MED ORDER — METFORMIN HCL 500 MG PO TABS: 500.0000 mg | ORAL_TABLET | Freq: Every day | ORAL | 0 refills | Status: DC

## 2019-11-26 MED ORDER — VITAMIN D (ERGOCALCIFEROL) 1.25 MG (50000 UNIT) PO CAPS: 50000.0000 [IU] | ORAL_CAPSULE | ORAL | 0 refills | Status: DC

## 2019-11-26 NOTE — Progress Notes (Signed)
Chief Complaint:   OBESITY Katrina Beard is here to discuss her progress with her obesity treatment plan along with follow-up of her obesity related diagnoses. Katrina Beard is on the Category 2 Plan and states she is following her eating plan approximately 40% of the time. Katrina Beard states she is walking for 30 minutes 2 times per week.  Today's visit was #: 9 Starting weight: 231 lbs Starting date: 04/02/2018 Today's weight: 213 lbs Today's date: 11/26/2019 Total lbs lost to date: 18 Total lbs lost since last in-office visit: 5  Interim History: Katrina Beard notes that she is having less appetite since starting Ozempic. She is eating regular meals and getting in her protein. She has reduced eating out at lunch.  Subjective:   1. Type 2 diabetes mellitus with other specified complication, without long-term current use of insulin ([place]) Katrina Beard's is doing well on Ozempic. She notes mild constipation. She has taken dulcolax occasionally. Her last A1c was 6.3, and her CBGs range in 80's. Her diabetes mellitus is well controlled. She is on Ozempic, metformin, and Jardiance.  Lab Results  Component Value Date   HGBA1C 6.3 (H) 08/06/2019   HGBA1C 7.2 (H) 04/03/2019   Lab Results  Component Value Date   LDLCALC 85 04/03/2019   CREATININE 0.95 08/06/2019   Lab Results  Component Value Date   INSULIN 11.2 04/03/2019   2. Vitamin D deficiency Katrina Beard's last Vit D level was low at 43.6. she is on weekly prescription Vit D.  3. Other depression, with emotional eating Katrina Beard denies excessive cravings. She is able to resist cravings better than in the past.  4. At risk for side effect of medication Katrina Beard is at risk for drug side effects due to Ozempic.  Assessment/Plan:   1. Type 2 diabetes mellitus with other specified complication, without long-term current use of insulin ([place]) Katrina Beard agreed to continue her medications, and we will refill both Ozempic and metformin for 1 month.  - Semaglutide,0.25 or  0.5MG /DOS, (OZEMPIC, 0.25 OR 0.5 MG/DOSE,) 2 MG/1.5ML SOPN; Inject 0.5 mg into the skin once a week.  Dispense: 1.5 mL; Refill: 0 - metFORMIN (GLUCOPHAGE) 500 MG tablet; Take 1 tablet (500 mg total) by mouth daily with breakfast.  Dispense: 30 tablet; Refill: 0  2. Vitamin D deficiency  We will refill prescription Vitamin D for 1 month.  - Vitamin D, Ergocalciferol, (DRISDOL) 1.25 MG (50000 UNIT) CAPS capsule; Take 1 capsule (50,000 Units total) by mouth every 7 (seven) days.  Dispense: 4 capsule; Refill: 0  3. Other depression, with emotional eating  We will refill bupropion for 1 month.    - buPROPion (WELLBUTRIN SR) 150 MG 12 hr tablet; Take 1 tablet (150 mg total) by mouth every morning.  Dispense: 30 tablet; Refill: 0  4. At risk for side effect of medication Katrina Beard was given approximately 15 minutes of drug side effect counseling today. We discussed side effect possibility and risk versus benefits of Ozempic. Katrina Beard agreed to continue Ozempic. I advised not to take Dulcolax regularly but rather she may take Metamucil or Miralax daily PRN.   5. Class 3 severe obesity with serious comorbidity and body mass index (BMI) of 40.0 to 44.9 in adult, unspecified obesity type ([place]) Katrina Beard is currently in the action stage of change. As such, her goal is to continue with weight loss efforts. She has agreed to the Category 2 Plan.   Exercise goals: Increase walking frequency.  Behavioral modification strategies: increasing lean protein intake and decreasing simple  carbohydrates.  Katrina Beard has agreed to follow-up with our clinic in 2 to 3 weeks.  Objective:   Blood pressure 127/75, pulse 68, temperature 98 F (36.7 C), temperature source Oral, height 5\' 1"  (1.549 m), weight 213 lb (96.6 kg), SpO2 100 %. Body mass index is 40.25 kg/m.  General: Cooperative, alert, well developed, in no acute distress. HEENT: Conjunctivae and lids unremarkable. Cardiovascular: Regular rhythm.  Lungs: Normal work  of breathing. Neurologic: No focal deficits.   Lab Results  Component Value Date   CREATININE 0.95 08/06/2019   BUN 14 08/06/2019   NA 140 08/06/2019   K 4.3 08/06/2019   CL 103 08/06/2019   CO2 21 08/06/2019   Lab Results  Component Value Date   ALT 19 08/06/2019   AST 18 08/06/2019   ALKPHOS 108 08/06/2019   BILITOT 0.4 08/06/2019   Lab Results  Component Value Date   HGBA1C 6.3 (H) 08/06/2019   HGBA1C 7.2 (H) 04/03/2019   Lab Results  Component Value Date   INSULIN 11.2 04/03/2019   Lab Results  Component Value Date   TSH 0.575 04/03/2019   Lab Results  Component Value Date   CHOL 153 04/03/2019   HDL 51 04/03/2019   LDLCALC 85 04/03/2019   TRIG 91 04/03/2019   Lab Results  Component Value Date   WBC 6.9 04/03/2019   HGB 11.8 04/03/2019   HCT 37.9 04/03/2019   MCV 81 04/03/2019   PLT 331 04/03/2019   No results found for: IRON, TIBC, FERRITIN  Attestation Statements:   Reviewed by clinician on day of visit: allergies, medications, problem list, medical history, surgical history, family history, social history, and previous encounter notes.   Wilhemena Durie, am acting as Location manager for [employer], FNP-C.  I have reviewed the above documentation for accuracy and completeness, and I agree with the above. -  Georgianne Fick, FNP

## 2019-12-18 ENCOUNTER — Other Ambulatory Visit: Payer: Self-pay

## 2019-12-18 ENCOUNTER — Ambulatory Visit (INDEPENDENT_AMBULATORY_CARE_PROVIDER_SITE_OTHER): Payer: BC Managed Care – PPO | Admitting: Family Medicine

## 2019-12-18 ENCOUNTER — Encounter (INDEPENDENT_AMBULATORY_CARE_PROVIDER_SITE_OTHER): Payer: Self-pay | Admitting: Family Medicine

## 2019-12-18 VITALS — BP 150/84 | HR 76 | Temp 98.2°F | Ht 61.0 in | Wt 213.0 lb

## 2019-12-18 DIAGNOSIS — Z9189 Other specified personal risk factors, not elsewhere classified: Secondary | ICD-10-CM

## 2019-12-18 DIAGNOSIS — E1169 Type 2 diabetes mellitus with other specified complication: Secondary | ICD-10-CM | POA: Diagnosis not present

## 2019-12-18 DIAGNOSIS — F3289 Other specified depressive episodes: Secondary | ICD-10-CM | POA: Diagnosis not present

## 2019-12-18 DIAGNOSIS — E559 Vitamin D deficiency, unspecified: Secondary | ICD-10-CM

## 2019-12-18 DIAGNOSIS — I152 Hypertension secondary to endocrine disorders: Secondary | ICD-10-CM

## 2019-12-18 DIAGNOSIS — E1159 Type 2 diabetes mellitus with other circulatory complications: Secondary | ICD-10-CM

## 2019-12-18 DIAGNOSIS — R110 Nausea: Secondary | ICD-10-CM

## 2019-12-18 DIAGNOSIS — Z6841 Body Mass Index (BMI) 40.0 and over, adult: Secondary | ICD-10-CM

## 2019-12-18 MED ORDER — VITAMIN D (ERGOCALCIFEROL) 1.25 MG (50000 UNIT) PO CAPS: 50000.0000 [IU] | ORAL_CAPSULE | ORAL | 0 refills | Status: DC

## 2019-12-18 MED ORDER — ONDANSETRON 4 MG PO TBDP: 4.0000 mg | ORAL_TABLET | Freq: Three times a day (TID) | ORAL | 0 refills | Status: DC | PRN

## 2019-12-18 MED ORDER — METFORMIN HCL 500 MG PO TABS: 500.0000 mg | ORAL_TABLET | Freq: Every day | ORAL | 0 refills | Status: DC

## 2019-12-18 MED ORDER — BUPROPION HCL ER (SR) 150 MG PO TB12: 150.0000 mg | ORAL_TABLET | ORAL | 0 refills | Status: DC

## 2019-12-18 MED ORDER — OZEMPIC (1 MG/DOSE) 2 MG/1.5ML ~~LOC~~ SOPN: 1.0000 mg | PEN_INJECTOR | SUBCUTANEOUS | 0 refills | Status: DC

## 2019-12-18 NOTE — Progress Notes (Signed)
Chief Complaint:   OBESITY Katrina Beard is here to discuss her progress with her obesity treatment plan along with follow-up of her obesity related diagnoses. Katrina Beard is on the Category 2 Plan and states she is following her eating plan approximately 0% of the time. [place] states she is doing 0 minutes 0 times per week.  Today's visit was #: 14 Starting weight: 231 lbs Starting date: 04/02/2018 Today's weight: 213 lbs Today's date: 12/18/2019 Total lbs lost to date: 18 Total lbs lost since last in-office visit: 0  Interim History: Katrina Beard has been very busy with work which has her off the plan. She is happy with weight maintenance. She is having a shake at breakfast. She tends to be off the plan at lunch.  Subjective:   1. Hypertension associated with type 2 diabetes mellitus ([place]) Katrina Beard's blood pressure is elevated today. She is on losartan, and Norvasc was discontinued a few months ago. Cardiovascular ROS: no chest pain or dyspnea on exertion.  BP Readings from Last 3 Encounters:  12/18/19 (!) 150/84  11/26/19 127/75  10/25/19 (!) 145/86   Lab Results  Component Value Date   CREATININE 0.95 08/06/2019   CREATININE 0.79 04/03/2019   CREATININE 1.70 (H) 07/24/2016   2. Other depression, with emotional eating Katrina Beard has been eating poorly due to circumstance. She denies stress eating.   3. Type 2 diabetes mellitus with other specified complication, without long-term current use of insulin ([place]) Unity's diabetes mellitus is well controlled on Ozempic, metformin, and Jardiance. Her CBGs fasting range between 80 and 90's.   Lab Results  Component Value Date   HGBA1C 6.3 (H) 08/06/2019   HGBA1C 7.2 (H) 04/03/2019   Lab Results  Component Value Date   LDLCALC 85 04/03/2019   CREATININE 0.95 08/06/2019   Lab Results  Component Value Date   INSULIN 11.2 04/03/2019   4. Vitamin D deficiency Katrina Beard's Vit D level is low at 43.6. She is on weekly prescription Vit D.  5. Nausea  Katrina Beard had nausea with last dose increase of Ozempic.  6. At risk for side effect of medication Floretta is at risk for drug side effects due to increased dose of Ozempic (possible nausea and constipation).  Assessment/Plan:   1. Hypertension associated with type 2 diabetes mellitus ([place])  We will continue to monitor, and may need to add back Norvasc.  2. Other depression, with emotional eating  We will refill bupropion for 1 month. Orders and follow up as documented in patient record.   - buPROPion (WELLBUTRIN SR) 150 MG 12 hr tablet; Take 1 tablet (150 mg total) by mouth every morning.  Dispense: 30 tablet; Refill: 0  3. Type 2 diabetes mellitus with other specified complication, without long-term current use of insulin ([place])  [name] agreed to increase Ozempic to 1 mg SubQ weekly, with no refill, and we will refill metformin for 1 month.  - metFORMIN (GLUCOPHAGE) 500 MG tablet; Take 1 tablet (500 mg total) by mouth daily with breakfast.  Dispense: 30 tablet; Refill: 0 - Semaglutide, 1 MG/DOSE, (OZEMPIC, 1 MG/DOSE,) 2 MG/1.5ML SOPN; Inject 1 mg into the skin once a week.  Dispense: 3 mL; Refill: 0  4. Vitamin D deficiency Low Vitamin D level contributes to fatigue and are associated with obesity, breast, and colon cancer. We will refill prescription Vitamin D for 1 month.  - Vitamin D, Ergocalciferol, (DRISDOL) 1.25 MG (50000 UNIT) CAPS capsule; Take 1 capsule (50,000 Units total) by mouth every 7 (seven)  days.  Dispense: 4 capsule; Refill: 0  5. Nausea Katrina Beard agreed to start Zofran ODT 4 mg as needed for nausea, with no refills. [facility] will continue to follow up as directed.  - ondansetron (ZOFRAN ODT) 4 MG disintegrating tablet; Take 1 tablet (4 mg total) by mouth every 8 (eight) hours as needed for nausea or vomiting.  Dispense: 20 tablet; Refill: 0  6. At risk for side effect of medication Katrina Beard was given approximately 15 minutes of drug side effect counseling today. We discussed side  effect possibility and risk versus benefits. Katrina Beard agreed to the medication and will contact this office if these side effects are intolerable.  Repetitive spaced learning was employed today to elicit superior memory formation and behavioral change.  7. Class 3 severe obesity with serious comorbidity and body mass index (BMI) of 40.0 to 44.9 in adult, unspecified obesity type ([place]) [name] is currently in the action stage of change. As such, her goal is to continue with weight loss efforts. She has agreed to the Category 2 Plan.   We will recheck fasting labs at her next office visit.  Exercise goals: All adults should avoid inactivity. Some physical activity is better than none, and adults who participate in any amount of physical activity gain some health benefits.  Behavioral modification strategies: increasing lean protein intake and decreasing simple carbohydrates.  Katrina Beard has agreed to follow-up with our clinic in 3 weeks.   Objective:   Blood pressure (!) 150/84, pulse 76, temperature 98.2 F (36.8 C), temperature source Oral, height 5\' 1"  (1.549 m), weight 213 lb (96.6 kg), SpO2 100 %. Body mass index is 40.25 kg/m.  General: Cooperative, alert, well developed, in no acute distress. HEENT: Conjunctivae and lids unremarkable. Cardiovascular: Regular rhythm.  Lungs: Normal work of breathing. Neurologic: No focal deficits.   Lab Results  Component Value Date   CREATININE 0.95 08/06/2019   BUN 14 08/06/2019   NA 140 08/06/2019   K 4.3 08/06/2019   CL 103 08/06/2019   CO2 21 08/06/2019   Lab Results  Component Value Date   ALT 19 08/06/2019   AST 18 08/06/2019   ALKPHOS 108 08/06/2019   BILITOT 0.4 08/06/2019   Lab Results  Component Value Date   HGBA1C 6.3 (H) 08/06/2019   HGBA1C 7.2 (H) 04/03/2019   Lab Results  Component Value Date   INSULIN 11.2 04/03/2019   Lab Results  Component Value Date   TSH 0.575 04/03/2019   Lab Results  Component Value Date    CHOL 153 04/03/2019   HDL 51 04/03/2019   LDLCALC 85 04/03/2019   TRIG 91 04/03/2019   Lab Results  Component Value Date   WBC 6.9 04/03/2019   HGB 11.8 04/03/2019   HCT 37.9 04/03/2019   MCV 81 04/03/2019   PLT 331 04/03/2019   No results found for: IRON, TIBC, FERRITIN  Attestation Statements:   Reviewed by clinician on day of visit: allergies, medications, problem list, medical history, surgical history, family history, social history, and previous encounter notes.   Wilhemena Durie, am acting as Location manager for [employer], FNP-C.  I have reviewed the above documentation for accuracy and completeness, and I agree with the above. -  Georgianne Fick, FNP

## 2019-12-19 ENCOUNTER — Other Ambulatory Visit: Payer: Self-pay | Admitting: Family Medicine

## 2019-12-19 DIAGNOSIS — Z1231 Encounter for screening mammogram for malignant neoplasm of breast: Secondary | ICD-10-CM

## 2020-01-08 ENCOUNTER — Ambulatory Visit (INDEPENDENT_AMBULATORY_CARE_PROVIDER_SITE_OTHER): Payer: BC Managed Care – PPO | Admitting: Family Medicine

## 2020-01-08 ENCOUNTER — Other Ambulatory Visit: Payer: Self-pay

## 2020-01-08 ENCOUNTER — Encounter (INDEPENDENT_AMBULATORY_CARE_PROVIDER_SITE_OTHER): Payer: Self-pay | Admitting: Family Medicine

## 2020-01-08 VITALS — BP 114/77 | HR 71 | Temp 98.1°F | Ht 61.0 in | Wt 205.0 lb

## 2020-01-08 DIAGNOSIS — E559 Vitamin D deficiency, unspecified: Secondary | ICD-10-CM | POA: Diagnosis not present

## 2020-01-08 DIAGNOSIS — Z6838 Body mass index (BMI) 38.0-38.9, adult: Secondary | ICD-10-CM

## 2020-01-08 DIAGNOSIS — R748 Abnormal levels of other serum enzymes: Secondary | ICD-10-CM | POA: Diagnosis not present

## 2020-01-08 DIAGNOSIS — E1169 Type 2 diabetes mellitus with other specified complication: Secondary | ICD-10-CM | POA: Diagnosis not present

## 2020-01-08 DIAGNOSIS — Z9884 Bariatric surgery status: Secondary | ICD-10-CM | POA: Insufficient documentation

## 2020-01-08 DIAGNOSIS — K909 Intestinal malabsorption, unspecified: Secondary | ICD-10-CM | POA: Diagnosis not present

## 2020-01-08 MED ORDER — OZEMPIC (1 MG/DOSE) 2 MG/1.5ML ~~LOC~~ SOPN: 1.0000 mg | PEN_INJECTOR | SUBCUTANEOUS | 0 refills | Status: DC

## 2020-01-09 ENCOUNTER — Encounter (INDEPENDENT_AMBULATORY_CARE_PROVIDER_SITE_OTHER): Payer: Self-pay | Admitting: Family Medicine

## 2020-01-09 NOTE — Progress Notes (Signed)
Chief Complaint:   OBESITY Katrina Beard is here to discuss her progress with her obesity treatment plan along with follow-up of her obesity related diagnoses. Katrina Beard is on the Category 2 Plan and states she is following her eating plan approximately 25% of the time. Katrina Beard states she is walking 25 minutes 2 times per week.  Today's visit was #: 15 Starting weight: 231 lbs Starting date: 04/03/2019 Today's weight: 205 lbs Today's date: 01/08/2020 Total lbs lost to date: 26 Total lbs lost since last in-office visit: 8  Interim History: Katrina Beard is not skipping meals. However, she is deviating from the plan quite a bit. The Ozempic is helping her eat less. She feels she is getting in enough protein.  Subjective:   Elevated vitamin B12 level. Katrina Beard is on three multivitamins daily and was instructed to do so by her bariatric surgeon.. She is status post gastric bypass. B12 is high.   Ref. Range 08/06/2019 11:55  Vitamin B12 Latest Ref Range: 232 - 1,245 pg/mL 1,927 (H)   Type 2 diabetes mellitus with other specified complication, without long-term current use of insulin ([place]).  Katrina Beard is on Ozempic 1 mg and notes this increases satiety. Diabetes mellitus is well controlled.  Lab Results  Component Value Date   HGBA1C 6.3 (H) 08/06/2019   HGBA1C 7.2 (H) 04/03/2019   Lab Results  Component Value Date   LDLCALC 85 04/03/2019   CREATININE 0.95 08/06/2019   Lab Results  Component Value Date   INSULIN 11.2 04/03/2019   Vitamin D deficiency. Vitamin D slightly low at 43.6. Katrina Beard is on weekly prescription Vitamin D supplementation.    Ref. Range 08/06/2019 11:55  Vitamin D, 25-Hydroxy Latest Ref Range: 30.0 - 100.0 ng/mL 43.6   Intestinal malabsorption, unspecified type. Katrina Beard has a history of gastric bypass. She reports being compliant with 3 multivitamins per day.  History of Roux-en-Y gastric bypass (2016). Katrina Beard lost from 265 to 228 and started to regain. She had surgery at [facility] and does not follow-up with her bariatric surgeon.  Assessment/Plan:   Elevated vitamin B12 level. Katrina Beard will continue to take three multivitamins daily as directed. B12 level will be checked today.  Type 2 diabetes mellitus with other specified complication, without long-term current use of insulin ([place]).   Refill was given for Semaglutide, 1 MG/DOSE, (OZEMPIC, 1 MG/DOSE,) 2 MG/1.5ML SOPN 1 mg weekly #2 pens with 0 refills. Hemoglobin A1c will be checked today.  Vitamin D deficiency.  She agrees to continue to take prescription Vitamin D as directed and will follow-up for routine testing of Vitamin D, at least 2-3 times per year to avoid over-replacement. VITAMIN D 25 Hydroxy (Vit-D Deficiency, Fractures)  Intestinal malabsorption, unspecified type.  Vitamin B12, Folate, Iron and TIBC, Ferritin, CBC with Differential/Platelet, Vitamin B1 labs will be checked today.   History of Roux-en-Y gastric bypass (2016). Labs will be checked today.  Class 2 severe obesity with serious comorbidity and body mass index (BMI) of 38.0 to 38.9 in adult, unspecified obesity type ([place]).  Katrina Beard is currently in the action stage of change. As such, her goal is to continue with weight loss efforts. She has agreed to the Category 2 Plan.   We discussed she should try to stick to the plan more exactly and work on reducing carbs and fried foods.  Exercise goals: Katrina Beard will continue her current exercise regimen.   Behavioral modification strategies: increasing lean protein intake, decreasing simple carbohydrates and  planning for success.  Katrina Beard has agreed to follow-up with our clinic in 3 weeks.  Katrina Beard was informed we would discuss her lab results at her next visit unless there is a critical issue that needs to be addressed sooner. Katrina Beard agreed to keep her next visit at the agreed upon time to discuss these results.  Objective:   Blood pressure 114/77, pulse 71, temperature 98.1 F  (36.7 C), height 5\' 1"  (1.549 m), weight 205 lb (93 kg), SpO2 100 %. Body mass index is 38.73 kg/m.  General: Cooperative, alert, well developed, in no acute distress. HEENT: Conjunctivae and lids unremarkable. Cardiovascular: Regular rhythm.  Lungs: Normal work of breathing. Neurologic: No focal deficits.   Lab Results  Component Value Date   CREATININE 0.95 08/06/2019   BUN 14 08/06/2019   NA 140 08/06/2019   K 4.3 08/06/2019   CL 103 08/06/2019   CO2 21 08/06/2019   Lab Results  Component Value Date   ALT 19 08/06/2019   AST 18 08/06/2019   ALKPHOS 108 08/06/2019   BILITOT 0.4 08/06/2019   Lab Results  Component Value Date   HGBA1C 6.3 (H) 08/06/2019   HGBA1C 7.2 (H) 04/03/2019   Lab Results  Component Value Date   INSULIN 11.2 04/03/2019   Lab Results  Component Value Date   TSH 0.575 04/03/2019   Lab Results  Component Value Date   CHOL 153 04/03/2019   HDL 51 04/03/2019   [place] 85 04/03/2019   TRIG 91 04/03/2019   Attestation Statements:   Reviewed by clinician on day of visit: allergies, medications, problem list, medical history, surgical history, family history, social history, and previous encounter notes.  IMichaelene Song, am acting as Location manager for [employer], FNP-C   I have reviewed the above documentation for accuracy and completeness, and I agree with the above. -  Georgianne Fick, FNP

## 2020-01-13 LAB — VITAMIN B1: Thiamine: 141.9 nmol/L (ref 66.5–200.0)

## 2020-01-13 LAB — CBC WITH DIFFERENTIAL/PLATELET
Basophils Absolute: 0 10*3/uL (ref 0.0–0.2)
Basos: 1 %
EOS (ABSOLUTE): 0.1 10*3/uL (ref 0.0–0.4)
Eos: 2 %
Hematocrit: 48.8 % — ABNORMAL HIGH (ref 34.0–46.6)
Hemoglobin: 15 g/dL (ref 11.1–15.9)
Immature Grans (Abs): 0 10*3/uL (ref 0.0–0.1)
Immature Granulocytes: 0 %
Lymphocytes Absolute: 1.8 10*3/uL (ref 0.7–3.1)
Lymphs: 43 %
MCH: 24.2 pg — ABNORMAL LOW (ref 26.6–33.0)
MCHC: 30.7 g/dL — ABNORMAL LOW (ref 31.5–35.7)
MCV: 79 fL (ref 79–97)
Monocytes Absolute: 0.4 10*3/uL (ref 0.1–0.9)
Monocytes: 10 %
Neutrophils Absolute: 1.9 10*3/uL (ref 1.4–7.0)
Neutrophils: 44 %
Platelets: 302 10*3/uL (ref 150–450)
RBC: 6.2 x10E6/uL — ABNORMAL HIGH (ref 3.77–5.28)
RDW: 17.5 % — ABNORMAL HIGH (ref 11.7–15.4)
WBC: 4.2 10*3/uL (ref 3.4–10.8)

## 2020-01-13 LAB — IRON AND TIBC
Iron Saturation: 9 % — CL (ref 15–55)
Iron: 28 ug/dL (ref 27–159)
Total Iron Binding Capacity: 324 ug/dL (ref 250–450)
UIBC: 296 ug/dL (ref 131–425)

## 2020-01-13 LAB — VITAMIN D 25 HYDROXY (VIT D DEFICIENCY, FRACTURES): Vit D, 25-Hydroxy: 55.2 ng/mL (ref 30.0–100.0)

## 2020-01-13 LAB — HEMOGLOBIN A1C
Est. average glucose Bld gHb Est-mCnc: 123 mg/dL
Hgb A1c MFr Bld: 5.9 % — ABNORMAL HIGH (ref 4.8–5.6)

## 2020-01-13 LAB — VITAMIN B12: Vitamin B-12: >2000 pg/mL — ABNORMAL HIGH (ref 232–1245)

## 2020-01-13 LAB — FOLATE: Folate: 12.4 ng/mL (ref >3.0)

## 2020-01-13 LAB — FERRITIN: Ferritin: 5 ng/mL — ABNORMAL LOW (ref 15–150)

## 2020-01-15 DIAGNOSIS — L910 Hypertrophic scar: Secondary | ICD-10-CM | POA: Insufficient documentation

## 2020-01-16 ENCOUNTER — Other Ambulatory Visit (INDEPENDENT_AMBULATORY_CARE_PROVIDER_SITE_OTHER): Payer: Self-pay | Admitting: Family Medicine

## 2020-01-16 DIAGNOSIS — E1169 Type 2 diabetes mellitus with other specified complication: Secondary | ICD-10-CM

## 2020-01-17 ENCOUNTER — Ambulatory Visit: Payer: BC Managed Care – PPO

## 2020-01-29 ENCOUNTER — Encounter (INDEPENDENT_AMBULATORY_CARE_PROVIDER_SITE_OTHER): Payer: Self-pay

## 2020-01-29 ENCOUNTER — Ambulatory Visit (INDEPENDENT_AMBULATORY_CARE_PROVIDER_SITE_OTHER): Payer: BC Managed Care – PPO | Admitting: Family Medicine

## 2020-01-29 ENCOUNTER — Other Ambulatory Visit: Payer: Self-pay

## 2020-01-29 ENCOUNTER — Encounter (INDEPENDENT_AMBULATORY_CARE_PROVIDER_SITE_OTHER): Payer: Self-pay | Admitting: Family Medicine

## 2020-01-29 VITALS — BP 131/80 | HR 72 | Temp 98.1°F | Ht 61.0 in | Wt 201.0 lb

## 2020-01-29 DIAGNOSIS — E559 Vitamin D deficiency, unspecified: Secondary | ICD-10-CM

## 2020-01-29 DIAGNOSIS — Z6838 Body mass index (BMI) 38.0-38.9, adult: Secondary | ICD-10-CM

## 2020-01-29 DIAGNOSIS — E1169 Type 2 diabetes mellitus with other specified complication: Secondary | ICD-10-CM | POA: Diagnosis not present

## 2020-01-29 DIAGNOSIS — F3289 Other specified depressive episodes: Secondary | ICD-10-CM

## 2020-01-29 DIAGNOSIS — D509 Iron deficiency anemia, unspecified: Secondary | ICD-10-CM

## 2020-01-29 MED ORDER — OZEMPIC (1 MG/DOSE) 2 MG/1.5ML ~~LOC~~ SOPN: 1.0000 mg | PEN_INJECTOR | SUBCUTANEOUS | 0 refills | Status: DC

## 2020-01-29 MED ORDER — VITAMIN D (ERGOCALCIFEROL) 1.25 MG (50000 UNIT) PO CAPS: 50000.0000 [IU] | ORAL_CAPSULE | ORAL | 0 refills | Status: DC

## 2020-01-29 MED ORDER — METFORMIN HCL 500 MG PO TABS: 500.0000 mg | ORAL_TABLET | Freq: Every day | ORAL | 0 refills | Status: DC

## 2020-01-30 ENCOUNTER — Encounter (INDEPENDENT_AMBULATORY_CARE_PROVIDER_SITE_OTHER): Payer: Self-pay | Admitting: Family Medicine

## 2020-01-30 MED ORDER — FERROUS SULFATE 325 (65 FE) MG PO TABS: 325.0000 mg | ORAL_TABLET | Freq: Every day | ORAL | 0 refills | Status: DC

## 2020-01-30 NOTE — Telephone Encounter (Signed)
Please advise 

## 2020-01-30 NOTE — Progress Notes (Signed)
Chief Complaint:   OBESITY Katrina Beard is here to discuss her progress with her obesity treatment plan along with follow-up of her obesity related diagnoses. Katrina Beard is on the Category 2 Plan and states she is following her eating plan approximately 25% of the time. Katrina Beard states she is doing 0 minutes 0 times per week.  Today's visit was #: 43 Starting weight: 231 lbs Starting date: 04/03/2019 Today's weight: 201 lbs Today's date: 01/29/2020 Total lbs lost to date: 30 Total lbs lost since last in-office visit: 4  Interim History: Katrina Beard is not following the Category 2 plan exactly but she does try to limit her carbohydrates and have plenty of protein. She is doing very well currently and is down 4 lbs today. She is getting protein in at all meals. She does not skip meals. She would like to try to start going to the gym.   Subjective:   1. Type 2 diabetes mellitus with other specified complication, without long-term current use of insulin (HCC) Katrina Beard's last A1c was 5.9. Diabetes mellitus is well controlled. She is on [place]. Her appetite is well controlled.  Lab Results  Component Value Date   HGBA1C 5.9 (H) 01/08/2020   HGBA1C 6.3 (H) 08/06/2019   HGBA1C 7.2 (H) 04/03/2019   Lab Results  Component Value Date   LDLCALC 85 04/03/2019   CREATININE 0.95 08/06/2019   Lab Results  Component Value Date   INSULIN 11.2 04/03/2019   2. Vitamin D deficiency Katrina Beard's Vit D level is at goal at 55.2. She is on weekly prescription Vit D.  3. Iron deficiency anemia, unspecified iron deficiency anemia type Katrina Beard is on oral iron in bariatric multivitamins BID. Her hemoglobin is within normal limits at 15, but her iron sat and ferritin are low. She has a history of gastric bypass.  CBC Latest Ref Rng & Units 01/08/2020 04/03/2019 07/24/2016  WBC 3.4 - 10.8 x10E3/uL 4.2 6.9 -  Hemoglobin 11.1 - 15.9 g/dL 15.0 11.8 13.9  Hematocrit 34.0 - 46.6 % 48.8(H) 37.9 41.0  Platelets 150 -  450 x10E3/uL 302 331 -   Lab Results  Component Value Date   IRON 28 01/08/2020   TIBC 324 01/08/2020   FERRITIN 5 (L) 01/08/2020   Lab Results  Component Value Date   VITAMINB12 >2000 (H) 01/08/2020   4. Other depression, with emotional eating Vega is unsure if bupropion is helping with cravings. She has cravings but they are manageable.   Assessment/Plan:   1. Type 2 diabetes mellitus with other specified complication, without long-term current use of insulin ([place]) . We will refill Ozempic and metformin for 1 month.  - Semaglutide, 1 MG/DOSE, (OZEMPIC, 1 MG/DOSE,) 2 MG/1.5ML SOPN; Inject 1 mg into the skin once a week.  Dispense: 3 mL; Refill: 0 - metFORMIN (GLUCOPHAGE) 500 MG tablet; Take 1 tablet (500 mg total) by mouth daily with breakfast.  Dispense: 30 tablet; Refill: 0  2. Vitamin D deficiency  We will refill prescription Vitamin D for 1 month.   - Vitamin D, Ergocalciferol, (DRISDOL) 1.25 MG (50000 UNIT) CAPS capsule; Take 1 capsule (50,000 Units total) by mouth every 7 (seven) days.  Dispense: 4 capsule; Refill: 0  3. Iron deficiency anemia, unspecified iron deficiency anemia type Katrina Beard will add iron to 1 iron tablet (325) daily, and she will continue her bariatric multivitamins BID. Orders and follow up as documented in patient record.  - ferrous sulfate (FERROUSUL) 325 (65 FE) MG tablet; Take 1  tablet (325 mg total) by mouth daily with breakfast.  Dispense: 30 tablet; Refill: 0  4. Other depression, with emotional eating Continue bupropion.  5. Class 2 severe obesity with serious comorbidity and body mass index (BMI) of 38.0 to 38.9 in adult, unspecified obesity type (HCC) Katrina Beard is currently in the action stage of change. As such, her goal is to continue with weight loss efforts. She has agreed to practicing portion control and making smarter food choices, such as increasing vegetables and decreasing simple carbohydrates.   Exercise goals: She will go to the gym  2-3 times per week.  Behavioral modification strategies: increasing lean protein intake.  Katrina Beard has agreed to follow-up with our clinic in 3 weeks.   Objective:   Blood pressure 131/80, pulse 72, temperature 98.1 F (36.7 C), height 5\' 1"  (1.549 m), weight 201 lb (91.2 kg), SpO2 98 %. Body mass index is 37.98 kg/m.  General: Cooperative, alert, well developed, in no acute distress. HEENT: Conjunctivae and lids unremarkable. Cardiovascular: Regular rhythm.  Lungs: Normal work of breathing. Neurologic: No focal deficits.   Lab Results  Component Value Date   CREATININE 0.95 08/06/2019   BUN 14 08/06/2019   NA 140 08/06/2019   K 4.3 08/06/2019   CL 103 08/06/2019   CO2 21 08/06/2019   Lab Results  Component Value Date   ALT 19 08/06/2019   AST 18 08/06/2019   ALKPHOS 108 08/06/2019   BILITOT 0.4 08/06/2019   Lab Results  Component Value Date   HGBA1C 5.9 (H) 01/08/2020   HGBA1C 6.3 (H) 08/06/2019   HGBA1C 7.2 (H) 04/03/2019   Lab Results  Component Value Date   INSULIN 11.2 04/03/2019   Lab Results  Component Value Date   TSH 0.575 04/03/2019   Lab Results  Component Value Date   CHOL 153 04/03/2019   HDL 51 04/03/2019   LDLCALC 85 04/03/2019   TRIG 91 04/03/2019   Lab Results  Component Value Date   WBC 4.2 01/08/2020   HGB 15.0 01/08/2020   HCT 48.8 (H) 01/08/2020   MCV 79 01/08/2020   PLT 302 01/08/2020   Lab Results  Component Value Date   IRON 28 01/08/2020   TIBC 324 01/08/2020   FERRITIN 5 (L) 01/08/2020   Attestation Statements:   Reviewed by clinician on day of visit: allergies, medications, problem list, medical history, surgical history, family history, social history, and previous encounter notes.   Wilhemena Durie, am acting as Location manager for [employer], FNP-C.  I have reviewed the above documentation for accuracy and completeness, and I agree with the above. -  Georgianne Fick, FNP

## 2020-01-31 ENCOUNTER — Encounter (INDEPENDENT_AMBULATORY_CARE_PROVIDER_SITE_OTHER): Payer: Self-pay | Admitting: Family Medicine

## 2020-01-31 DIAGNOSIS — D509 Iron deficiency anemia, unspecified: Secondary | ICD-10-CM | POA: Insufficient documentation

## 2020-02-12 NOTE — Telephone Encounter (Signed)
Please advise 

## 2020-02-13 NOTE — Telephone Encounter (Signed)
fyi

## 2020-02-18 ENCOUNTER — Other Ambulatory Visit: Payer: Self-pay

## 2020-02-18 ENCOUNTER — Ambulatory Visit (INDEPENDENT_AMBULATORY_CARE_PROVIDER_SITE_OTHER): Payer: BC Managed Care – PPO | Admitting: Family Medicine

## 2020-02-18 ENCOUNTER — Encounter (INDEPENDENT_AMBULATORY_CARE_PROVIDER_SITE_OTHER): Payer: Self-pay | Admitting: Family Medicine

## 2020-02-18 VITALS — BP 131/81 | HR 70 | Temp 98.1°F | Ht 61.0 in | Wt 204.0 lb

## 2020-02-18 DIAGNOSIS — E559 Vitamin D deficiency, unspecified: Secondary | ICD-10-CM | POA: Diagnosis not present

## 2020-02-18 DIAGNOSIS — E1169 Type 2 diabetes mellitus with other specified complication: Secondary | ICD-10-CM

## 2020-02-18 DIAGNOSIS — F3289 Other specified depressive episodes: Secondary | ICD-10-CM

## 2020-02-18 DIAGNOSIS — Z6838 Body mass index (BMI) 38.0-38.9, adult: Secondary | ICD-10-CM

## 2020-02-18 MED ORDER — SEMAGLUTIDE (1 MG/DOSE) 4 MG/3ML ~~LOC~~ SOPN: 1.0000 mg | PEN_INJECTOR | SUBCUTANEOUS | 0 refills | Status: DC

## 2020-02-18 MED ORDER — VITAMIN D (ERGOCALCIFEROL) 1.25 MG (50000 UNIT) PO CAPS: 50000.0000 [IU] | ORAL_CAPSULE | ORAL | 0 refills | Status: DC

## 2020-02-18 MED ORDER — BUPROPION HCL ER (SR) 150 MG PO TB12: 150.0000 mg | ORAL_TABLET | ORAL | 0 refills | Status: DC

## 2020-02-20 ENCOUNTER — Encounter (INDEPENDENT_AMBULATORY_CARE_PROVIDER_SITE_OTHER): Payer: Self-pay | Admitting: Family Medicine

## 2020-02-20 MED ORDER — METFORMIN HCL 500 MG PO TABS: 500.0000 mg | ORAL_TABLET | Freq: Every day | ORAL | 0 refills | Status: DC

## 2020-02-20 NOTE — Progress Notes (Signed)
Chief Complaint:   OBESITY Katrina Beard is here to discuss her progress with her obesity treatment plan along with follow-up of her obesity related diagnoses. Katrina Beard is on practicing portion control and making smarter food choices, such as increasing vegetables and decreasing simple carbohydrates and states she is following her eating plan approximately 20% of the time. Katrina Beard states she is doing 0 minutes 0 times per week.  Today's visit was #: 22 Starting weight: 231 lbs Starting date: 04/03/2019 Today's weight: 204 lbs Today's date: 02/18/2020 Total lbs lost to date: 27 Total lbs lost since last in-office visit: 0  Interim History: Katrina Beard notes carbohydrate cravings after supper since her dose of bupropion was decreased to 75 mg q daily.  She is trying to eat breakfast rather than drinking protein shakes. She may be buying a house.  Subjective:   1. Vitamin D deficiency Katrina Beard's Vit D level is at goal at 55.2 (01/08/2020). She is on weekly prescription Vit D.   2. Type 2 diabetes mellitus with other specified complication, without long-term current use of insulin (HCC) Katrina Beard's diabetes mellitus is well controlled. Last A1c was 5.9 on 01/08/2020. She is on metformin 500 mg, Ozempic 1 mg weekly, and Jardiance 10 mg q daily.   Lab Results  Component Value Date   HGBA1C 5.9 (H) 01/08/2020   HGBA1C 6.3 (H) 08/06/2019   HGBA1C 7.2 (H) 04/03/2019   Lab Results  Component Value Date   LDLCALC 85 04/03/2019   CREATININE 0.95 08/06/2019   Lab Results  Component Value Date   INSULIN 11.2 04/03/2019   3. Other depression, with emotional eating Katrina Beard notes carbohydrate cravings after supper. We cut bupropion down to 75 mg daily, but she feels she needs to go back up to 150 mg daily.  Assessment/Plan:   1. Vitamin D deficiency . We will refill prescription Vitamin D for 1 month. Nereida will follow-up for routine testing of Vitamin D, at least 2-3 times per year to avoid  over-replacement.  - Vitamin D, Ergocalciferol, (DRISDOL) 1.25 MG (50000 UNIT) CAPS capsule; Take 1 capsule (50,000 Units total) by mouth every 7 (seven) days.  Dispense: 4 capsule; Refill: 0  2. Type 2 diabetes mellitus with other specified complication, without long-term current use of insulin (HCC)  We will refill both metformin 500 mg q AM and Ozempic 1 mg for 1 month.  - Semaglutide, 1 MG/DOSE, 4 MG/3ML SOPN; Inject 1 mg into the skin once a week.  Dispense: 3 mL; Refill: 0  3. Other depression, with emotional eating . We will refill bupropion SR for 1 month, and Samyia will take full dose. Orders and follow up as documented in patient record.   - buPROPion (WELLBUTRIN SR) 150 MG 12 hr tablet; Take 1 tablet (150 mg total) by mouth every morning.  Dispense: 30 tablet; Refill: 0  4. Class 2 severe obesity with serious comorbidity and body mass index (BMI) of 38.0 to 38.9 in adult, unspecified obesity type (HCC) Katrina Beard is currently in the action stage of change. As such, her goal is to continue with weight loss efforts. She has agreed to practicing portion control and making smarter food choices, such as increasing vegetables and decreasing simple carbohydrates.   Handout was given today: Egg muffin recipe.  Exercise goals: All adults should avoid inactivity. Some physical activity is better than none, and adults who participate in any amount of physical activity gain some health benefits.  Behavioral modification strategies: increasing lean protein intake and  decreasing simple carbohydrates.  Katrina Beard has agreed to follow-up with our clinic in 4 weeks.  Objective:   Blood pressure 131/81, pulse 70, temperature 98.1 F (36.7 C), height 5\' 1"  (1.549 m), weight 204 lb (92.5 kg), SpO2 99 %. Body mass index is 38.55 kg/m.  General: Cooperative, alert, well developed, in no acute distress. HEENT: Conjunctivae and lids unremarkable. Cardiovascular: Regular rhythm.  Lungs: Normal work of  breathing. Neurologic: No focal deficits.   Lab Results  Component Value Date   CREATININE 0.95 08/06/2019   BUN 14 08/06/2019   NA 140 08/06/2019   K 4.3 08/06/2019   CL 103 08/06/2019   CO2 21 08/06/2019   Lab Results  Component Value Date   ALT 19 08/06/2019   AST 18 08/06/2019   ALKPHOS 108 08/06/2019   BILITOT 0.4 08/06/2019   Lab Results  Component Value Date   HGBA1C 5.9 (H) 01/08/2020   HGBA1C 6.3 (H) 08/06/2019   HGBA1C 7.2 (H) 04/03/2019   Lab Results  Component Value Date   INSULIN 11.2 04/03/2019   Lab Results  Component Value Date   TSH 0.575 04/03/2019   Lab Results  Component Value Date   CHOL 153 04/03/2019   HDL 51 04/03/2019   LDLCALC 85 04/03/2019   TRIG 91 04/03/2019   Lab Results  Component Value Date   WBC 4.2 01/08/2020   HGB 15.0 01/08/2020   HCT 48.8 (H) 01/08/2020   MCV 79 01/08/2020   PLT 302 01/08/2020   Lab Results  Component Value Date   IRON 28 01/08/2020   TIBC 324 01/08/2020   FERRITIN 5 (L) 01/08/2020   Attestation Statements:   Reviewed by clinician on day of visit: allergies, medications, problem list, medical history, surgical history, family history, social history, and previous encounter notes.   Wilhemena Durie, am acting as Location manager for [employer], FNP-C.  I have reviewed the above documentation for accuracy and completeness, and I agree with the above. -  Georgianne Fick, FNP

## 2020-02-27 ENCOUNTER — Ambulatory Visit: Payer: BC Managed Care – PPO

## 2020-03-17 ENCOUNTER — Encounter (INDEPENDENT_AMBULATORY_CARE_PROVIDER_SITE_OTHER): Payer: Self-pay | Admitting: Family Medicine

## 2020-03-17 ENCOUNTER — Ambulatory Visit (INDEPENDENT_AMBULATORY_CARE_PROVIDER_SITE_OTHER): Payer: BC Managed Care – PPO | Admitting: Family Medicine

## 2020-03-17 ENCOUNTER — Other Ambulatory Visit: Payer: Self-pay

## 2020-03-17 VITALS — BP 138/83 | HR 91 | Temp 98.1°F | Ht 61.0 in | Wt 199.0 lb

## 2020-03-17 DIAGNOSIS — E1169 Type 2 diabetes mellitus with other specified complication: Secondary | ICD-10-CM

## 2020-03-17 DIAGNOSIS — F3289 Other specified depressive episodes: Secondary | ICD-10-CM | POA: Diagnosis not present

## 2020-03-17 DIAGNOSIS — E559 Vitamin D deficiency, unspecified: Secondary | ICD-10-CM | POA: Diagnosis not present

## 2020-03-17 DIAGNOSIS — Z6837 Body mass index (BMI) 37.0-37.9, adult: Secondary | ICD-10-CM

## 2020-03-17 MED ORDER — BUPROPION HCL ER (SR) 150 MG PO TB12: 150.0000 mg | ORAL_TABLET | ORAL | 0 refills | Status: DC

## 2020-03-17 MED ORDER — SEMAGLUTIDE (1 MG/DOSE) 4 MG/3ML ~~LOC~~ SOPN: 1.0000 mg | PEN_INJECTOR | SUBCUTANEOUS | 0 refills | Status: DC

## 2020-03-17 MED ORDER — VITAMIN D (ERGOCALCIFEROL) 1.25 MG (50000 UNIT) PO CAPS: 50000.0000 [IU] | ORAL_CAPSULE | ORAL | 0 refills | Status: DC

## 2020-03-17 NOTE — Telephone Encounter (Signed)
Last seen by Dawn  

## 2020-03-18 NOTE — Telephone Encounter (Signed)
Please review

## 2020-03-19 ENCOUNTER — Encounter (INDEPENDENT_AMBULATORY_CARE_PROVIDER_SITE_OTHER): Payer: Self-pay | Admitting: Family Medicine

## 2020-03-19 NOTE — Progress Notes (Signed)
Chief Complaint:   OBESITY Katrina Beard is here to discuss her progress with her obesity treatment plan along with follow-up of her obesity related diagnoses. Katrina Beard is on practicing portion control and making smarter food choices, such as increasing vegetables and decreasing simple carbohydrates and states she is following her eating plan approximately 15% of the time. Katrina Beard states she is exercising 0 minutes 0 times per week.  Today's visit was #: 38 Starting weight: 231 lbs Starting date: 04/03/2019 Today's weight: 199 lbs Today's date: 03/17/2020 Total lbs lost to date: 32 lbs Total lbs lost since last in-office visit: 5 lbs  Interim History: Katrina Beard notes she is having cravings at night and is unsure how she has lost 5 lbs. She is not skipping meals. She has cut back on protein shakes. She is having protein with all meals.  Subjective:   1. Type 2 diabetes mellitus with other specified complication, without long-term current use of insulin (HCC) Katrina Beard's diabetes is well controlled on Metformin and Ozempic.   Lab Results  Component Value Date   HGBA1C 5.9 (H) 01/08/2020   HGBA1C 6.3 (H) 08/06/2019   HGBA1C 7.2 (H) 04/03/2019   Lab Results  Component Value Date   LDLCALC 85 04/03/2019   CREATININE 0.95 08/06/2019   Lab Results  Component Value Date   INSULIN 11.2 04/03/2019    2. Other depression, with emotional eating Katrina Beard's mood is stable. She notes cravings, especially at night.  3. Vitamin D deficiency Katrina Beard's Vitamin D level is at goal at 55.2. She is currently taking prescription vitamin D 50,000 IU each week.  Assessment/Plan:   1. Type 2 diabetes mellitus with other specified complication, without long-term current use of insulin (HCC) We will refill Ozempic 1.0 mg weekly for 1 month, as per below.  Continue merformin.  - Semaglutide, 1 MG/DOSE, 4 MG/3ML SOPN; Inject 1 mg into the skin once a week.  Dispense: 3 mL; Refill: 0  2. Other depression, with  emotional eating We will refill Wellbutrin 150 mg for 1 month, as per below.  - buPROPion (WELLBUTRIN SR) 150 MG 12 hr tablet; Take 1 tablet (150 mg total) by mouth every morning.  Dispense: 30 tablet; Refill: 0  3. Vitamin D deficiency We will refill Vit D for 1 month, as per below. O  - Vitamin D, Ergocalciferol, (DRISDOL) 1.25 MG (50000 UNIT) CAPS capsule; Take 1 capsule (50,000 Units total) by mouth every 7 (seven) days.  Dispense: 4 capsule; Refill: 0  4. Class 2 severe obesity with serious comorbidity and body mass index (BMI) of 37.0 to 37.9 in adult, unspecified obesity type (HCC) Katrina Beard is currently in the action stage of change. As such, her goal is to continue with weight loss efforts. She has agreed to practicing portion control and making smarter food choices, such as increasing vegetables and decreasing simple carbohydrates.   Exercise goals: All adults should avoid inactivity. Some physical activity is better than none, and adults who participate in any amount of physical activity gain some health benefits.  Behavioral modification strategies: increasing lean protein intake and no skipping meals.  Katrina Beard has agreed to follow-up with our clinic in 3 weeks.   Objective:   Blood pressure 138/83, pulse 91, temperature 98.1 F (36.7 C), height 5\' 1"  (1.549 m), weight 199 lb (90.3 kg), SpO2 99 %. Body mass index is 37.6 kg/m.  General: Cooperative, alert, well developed, in no acute distress. HEENT: Conjunctivae and lids unremarkable. Cardiovascular: Regular rhythm.  Lungs:  Normal work of breathing. Neurologic: No focal deficits.   Lab Results  Component Value Date   CREATININE 0.95 08/06/2019   BUN 14 08/06/2019   NA 140 08/06/2019   K 4.3 08/06/2019   CL 103 08/06/2019   CO2 21 08/06/2019   Lab Results  Component Value Date   ALT 19 08/06/2019   AST 18 08/06/2019   ALKPHOS 108 08/06/2019   BILITOT 0.4 08/06/2019   Lab Results  Component Value Date   HGBA1C  5.9 (H) 01/08/2020   HGBA1C 6.3 (H) 08/06/2019   HGBA1C 7.2 (H) 04/03/2019   Lab Results  Component Value Date   INSULIN 11.2 04/03/2019   Lab Results  Component Value Date   TSH 0.575 04/03/2019   Lab Results  Component Value Date   CHOL 153 04/03/2019   HDL 51 04/03/2019   LDLCALC 85 04/03/2019   TRIG 91 04/03/2019   Lab Results  Component Value Date   WBC 4.2 01/08/2020   HGB 15.0 01/08/2020   HCT 48.8 (H) 01/08/2020   MCV 79 01/08/2020   PLT 302 01/08/2020   Lab Results  Component Value Date   IRON 28 01/08/2020   TIBC 324 01/08/2020   FERRITIN 5 (L) 01/08/2020    Attestation Statements:   Reviewed by clinician on day of visit: allergies, medications, problem list, medical history, surgical history, family history, social history, and previous encounter notes.  Edmund Hilda, am acting as Energy manager for [place], FNP.  I have reviewed the above documentation for accuracy and completeness, and I agree with the above. -  Jesse Sans, FNP

## 2020-03-21 ENCOUNTER — Other Ambulatory Visit: Payer: Self-pay | Admitting: Family Medicine

## 2020-03-21 ENCOUNTER — Other Ambulatory Visit (HOSPITAL_COMMUNITY)
Admission: RE | Admit: 2020-03-21 | Discharge: 2020-03-21 | Disposition: A | Discharge status: Home / Self Care | Payer: BC Managed Care – PPO | Source: Ambulatory Visit | Attending: Family Medicine | Admitting: Family Medicine

## 2020-03-21 DIAGNOSIS — Z01411 Encounter for gynecological examination (general) (routine) with abnormal findings: Secondary | ICD-10-CM | POA: Insufficient documentation

## 2020-03-26 ENCOUNTER — Encounter (INDEPENDENT_AMBULATORY_CARE_PROVIDER_SITE_OTHER): Payer: Self-pay

## 2020-03-26 LAB — CYTOLOGY - PAP
Adequacy: ABSENT
Comment: NEGATIVE
Diagnosis: NEGATIVE
High risk HPV: NEGATIVE

## 2020-04-06 ENCOUNTER — Encounter (INDEPENDENT_AMBULATORY_CARE_PROVIDER_SITE_OTHER): Payer: Self-pay | Admitting: Family Medicine

## 2020-04-07 ENCOUNTER — Ambulatory Visit (INDEPENDENT_AMBULATORY_CARE_PROVIDER_SITE_OTHER): Payer: BC Managed Care – PPO | Admitting: Family Medicine

## 2020-04-07 ENCOUNTER — Encounter (INDEPENDENT_AMBULATORY_CARE_PROVIDER_SITE_OTHER): Payer: Self-pay | Admitting: Family Medicine

## 2020-04-07 ENCOUNTER — Other Ambulatory Visit: Payer: Self-pay

## 2020-04-07 VITALS — BP 150/81 | HR 73 | Temp 98.2°F | Ht 61.0 in | Wt 200.0 lb

## 2020-04-07 DIAGNOSIS — E1159 Type 2 diabetes mellitus with other circulatory complications: Secondary | ICD-10-CM | POA: Diagnosis not present

## 2020-04-07 DIAGNOSIS — Z6837 Body mass index (BMI) 37.0-37.9, adult: Secondary | ICD-10-CM

## 2020-04-07 DIAGNOSIS — F3289 Other specified depressive episodes: Secondary | ICD-10-CM

## 2020-04-07 DIAGNOSIS — E1169 Type 2 diabetes mellitus with other specified complication: Secondary | ICD-10-CM | POA: Diagnosis not present

## 2020-04-07 DIAGNOSIS — I152 Hypertension secondary to endocrine disorders: Secondary | ICD-10-CM

## 2020-04-07 DIAGNOSIS — I10 Essential (primary) hypertension: Secondary | ICD-10-CM

## 2020-04-07 MED ORDER — BUPROPION HCL ER (SR) 150 MG PO TB12: 150.0000 mg | ORAL_TABLET | ORAL | 0 refills | Status: DC

## 2020-04-07 MED ORDER — METFORMIN HCL 500 MG PO TABS: 500.0000 mg | ORAL_TABLET | Freq: Every day | ORAL | 0 refills | Status: DC

## 2020-04-09 ENCOUNTER — Ambulatory Visit
Admission: RE | Admit: 2020-04-09 | Discharge: 2020-04-09 | Disposition: A | Discharge status: Home / Self Care | Payer: BC Managed Care – PPO | Source: Ambulatory Visit | Attending: Family Medicine | Admitting: Family Medicine

## 2020-04-09 ENCOUNTER — Other Ambulatory Visit: Payer: Self-pay

## 2020-04-09 DIAGNOSIS — Z1231 Encounter for screening mammogram for malignant neoplasm of breast: Secondary | ICD-10-CM

## 2020-04-09 NOTE — Progress Notes (Signed)
Chief Complaint:   OBESITY Katrina Beard is here to discuss her progress with her obesity treatment plan along with follow-up of her obesity related diagnoses. Katrina Beard is on practicing portion control and making smarter food choices, such as increasing vegetables and decreasing simple carbohydrates and states she is following her eating plan approximately 10% of the time. Katrina Beard states she is not exercising.  Today's visit was #: 56 Starting weight: 231 lbs Starting date: 04/03/2019 Today's weight: 200 lbs Today's date: 04/07/2020 Total lbs lost to date: 31 lbs Total lbs lost since last in-office visit: 0  Interim History: Katrina Beard has been out to eat quite a bit with her daughter over the past few weeks. She is not surprised she gained weight today. Katrina Beard feels she is having more appetite now and Katrina Beard is not working as well.  Katrina Beard has knee pain that prevents her from exercising.  Subjective:   1. Type 2 diabetes mellitus with other specified complication, without long-term current use of insulin (HCC)  Medications reviewed. Diabetic ROS: no medication side effects noted, weight has increased. Katrina Beard is on Katrina Beard, jardiance and metformin. Diabetes is well controlled. A1c was 5.9 on 01/08/2020. Katrina Beard denies hypoglycemia. CBGs - 110.  Lab Results  Component Value Date   HGBA1C 5.9 (H) 01/08/2020   HGBA1C 6.3 (H) 08/06/2019   HGBA1C 7.2 (H) 04/03/2019   Lab Results  Component Value Date   LDLCALC 85 04/03/2019   CREATININE 0.95 08/06/2019   Lab Results  Component Value Date   INSULIN 11.2 04/03/2019    2. Essential hypertension Cardiovascular ROS: no chest pain or dyspnea on exertion. Katrina Beard's blood pressure is elevated today. She feels it could be from coffee. She is on losartan and compliant with this medication.  BP Readings from Last 3 Encounters:  04/07/20 (!) 150/81  03/17/20 138/83  02/18/20 131/81   Lab Results  Component Value Date   CREATININE 0.95 08/06/2019    CREATININE 0.79 04/03/2019   CREATININE 1.70 (H) 07/24/2016   3. Other depression, with emotional eating  . Katrina Beard is having increased cravings. Her mood is stable.    Assessment/Plan:   1. Type 2 diabetes mellitus with other specified complication, without long-term current use of insulin ([place])  Katrina Beard will continue Katrina Beard. Will refill metformin 500 mg #30 no refill and discharge Jardiance. May consider switching to [facility] or [facility].   - metFORMIN (GLUCOPHAGE) 500 MG tablet; Take 1 tablet (500 mg total) by mouth daily with breakfast.  Dispense: 30 tablet; Refill: 0  2. Essential hypertension We will continue to monitor blood pressure.  3. Other depression, with emotional eating  We will refill bupropion 150 mg #30 no refill.  - buPROPion (WELLBUTRIN SR) 150 MG 12 hr tablet; Take 1 tablet (150 mg total) by mouth every morning.  Dispense: 30 tablet; Refill: 0  4. Class 2 severe obesity with serious comorbidity and body mass index (BMI) of 37.0 to 37.9 in adult, unspecified obesity type (HCC)  Katrina Beard is currently in the action stage of change. As such, her goal is to continue with weight loss efforts. She has agreed to practicing portion control and making smarter food choices, such as increasing vegetables and decreasing simple carbohydrates.   Will get prior authorization for [facility].   Exercise goals: Some exercise and she will consider exercising at the gym since she has a membership.  Behavioral modification strategies: decreasing simple carbohydrates and decreasing eating out.  Katrina Beard has agreed to follow-up with our clinic in  2-3 weeks. She was informed of the importance of frequent follow-up visits to maximize her success with intensive lifestyle modifications for her multiple health conditions.    Objective:   Blood pressure (!) 150/81, pulse 73, temperature 98.2 F (36.8 C), temperature source Oral, height 5\' 1"  (1.549 m), weight 200 lb (90.7 kg), SpO2 95 %. Body mass  index is 37.79 kg/m.  General: Cooperative, alert, well developed, in no acute distress. HEENT: Conjunctivae and lids unremarkable. Cardiovascular: Regular rhythm.  Lungs: Normal work of breathing. Neurologic: No focal deficits.   Lab Results  Component Value Date   CREATININE 0.95 08/06/2019   BUN 14 08/06/2019   NA 140 08/06/2019   K 4.3 08/06/2019   CL 103 08/06/2019   CO2 21 08/06/2019   Lab Results  Component Value Date   ALT 19 08/06/2019   AST 18 08/06/2019   ALKPHOS 108 08/06/2019   BILITOT 0.4 08/06/2019   Lab Results  Component Value Date   HGBA1C 5.9 (H) 01/08/2020   HGBA1C 6.3 (H) 08/06/2019   HGBA1C 7.2 (H) 04/03/2019   Lab Results  Component Value Date   INSULIN 11.2 04/03/2019   Lab Results  Component Value Date   TSH 0.575 04/03/2019   Lab Results  Component Value Date   CHOL 153 04/03/2019   HDL 51 04/03/2019   LDLCALC 85 04/03/2019   TRIG 91 04/03/2019   Lab Results  Component Value Date   WBC 4.2 01/08/2020   HGB 15.0 01/08/2020   HCT 48.8 (H) 01/08/2020   MCV 79 01/08/2020   PLT 302 01/08/2020   Lab Results  Component Value Date   IRON 28 01/08/2020   TIBC 324 01/08/2020   FERRITIN 5 (L) 01/08/2020     Attestation Statements:   Reviewed by clinician on day of visit: allergies, medications, problem list, medical history, surgical history, family history, social history, and previous encounter notes.  Tula Nakayama, am acting as Location manager for Georgianne Fick, FNP.  I have reviewed the above documentation for accuracy and completeness, and I agree with the above. -  Georgianne Fick, FNP

## 2020-04-10 ENCOUNTER — Encounter (INDEPENDENT_AMBULATORY_CARE_PROVIDER_SITE_OTHER): Payer: Self-pay | Admitting: Family Medicine

## 2020-04-15 ENCOUNTER — Telehealth (INDEPENDENT_AMBULATORY_CARE_PROVIDER_SITE_OTHER): Payer: Self-pay

## 2020-04-15 NOTE — Telephone Encounter (Signed)
PA approved 04/15/2020-11/13/2020

## 2020-04-15 NOTE — Telephone Encounter (Signed)
Unable to complete PA on covermymeds, PA called into [facility]

## 2020-04-21 ENCOUNTER — Ambulatory Visit (INDEPENDENT_AMBULATORY_CARE_PROVIDER_SITE_OTHER): Payer: BC Managed Care – PPO | Admitting: Family Medicine

## 2020-04-21 ENCOUNTER — Other Ambulatory Visit: Payer: Self-pay

## 2020-04-21 ENCOUNTER — Encounter (INDEPENDENT_AMBULATORY_CARE_PROVIDER_SITE_OTHER): Payer: Self-pay | Admitting: Family Medicine

## 2020-04-21 VITALS — BP 142/81 | HR 80 | Temp 98.0°F | Ht 61.0 in | Wt 202.0 lb

## 2020-04-21 DIAGNOSIS — Z6838 Body mass index (BMI) 38.0-38.9, adult: Secondary | ICD-10-CM

## 2020-04-21 DIAGNOSIS — E1169 Type 2 diabetes mellitus with other specified complication: Secondary | ICD-10-CM | POA: Diagnosis not present

## 2020-04-21 DIAGNOSIS — E559 Vitamin D deficiency, unspecified: Secondary | ICD-10-CM | POA: Diagnosis not present

## 2020-04-21 DIAGNOSIS — Z9189 Other specified personal risk factors, not elsewhere classified: Secondary | ICD-10-CM

## 2020-04-21 MED ORDER — VITAMIN D (ERGOCALCIFEROL) 1.25 MG (50000 UNIT) PO CAPS: 50000.0000 [IU] | ORAL_CAPSULE | ORAL | 0 refills | Status: DC

## 2020-04-21 MED ORDER — WEGOVY 1.7 MG/0.75ML ~~LOC~~ SOAJ
1.7000 mg | SUBCUTANEOUS | 0 refills | Status: DC
Start: 2020-04-21 — End: 2020-05-12

## 2020-04-21 NOTE — Progress Notes (Signed)
Chief Complaint:   OBESITY Katrina Beard is here to discuss her progress with her obesity treatment plan along with follow-up of her obesity related diagnoses. Katrina Beard is on practicing portion control and making smarter food choices, such as increasing vegetables and decreasing simple carbohydrates and states she is following her eating plan approximately 20% of the time. Katrina Beard states she is walking for 20 minutes 2 times per week.  Today's visit was #: 20 Starting weight: 231 lbs Starting date: 04/03/2019 Today's weight: 202 lbs Today's date: 04/21/2020 Total lbs lost to date: 29 Total lbs lost since last in-office visit: + 2  Interim History: Katrina Beard has gained weight the last 2 visits. She admits to making more poor choices and eating out too much. She wants to do better with her eating. It may be beneficial to switch her to [facility] so that we can increase dose of semaglutide.  Subjective:   1. Vitamin D deficiency Katrina Beard's last Vit D level was at goal. She is n weekly prescription Vit D.  2. Type 2 diabetes mellitus with other specified complication, without long-term current use of insulin (HCC) Katrina Beard's diabetes mellitus is well controlled. Last A1c was 5.9. She denies hypoglycemia, but she notes polyphagia in the evening. She is on metformin 500 mg q AM.  Lab Results  Component Value Date   HGBA1C 5.9 (H) 01/08/2020   HGBA1C 6.3 (H) 08/06/2019   HGBA1C 7.2 (H) 04/03/2019   Lab Results  Component Value Date   LDLCALC 85 04/03/2019   CREATININE 0.95 08/06/2019   Lab Results  Component Value Date   INSULIN 11.2 04/03/2019   3. At risk for adverse drug reaction Katrina Beard is at risk for drug side effects due to changing to [facility].  Assessment/Plan:   1. Vitamin D deficiency . Katrina Beard will follow-up for routine testing of Vitamin D, at least 2-3 times per year to avoid over-replacement.  - Vitamin D, Ergocalciferol, (DRISDOL) 1.25 MG (50000 UNIT) CAPS capsule; Take 1 capsule (50,000  Units total) by mouth every 7 (seven) days.  Dispense: 4 capsule; Refill: 0  2. Type 2 diabetes mellitus with other specified complication, without long-term current use of insulin ([place])  Katrina Beard agreed to change to [facility] 1.7 mg weekly with no refills.  - Semaglutide-Weight Management (WEGOVY) 1.7 MG/0.75ML SOAJ; Inject 1.7 mg into the skin once a week.  Dispense: 3 mL; Refill: 0  3. At risk for adverse drug reaction Katrina Beard was given approximately 15 minutes of drug side effect counseling today.  We discussed side effect possibility and risk versus benefits. Katrina Beard agreed to the medication and will contact this office if these side effects are intolerable.  Repetitive spaced learning was employed today to elicit superior memory formation and behavioral change.  4. Class 2 severe obesity with serious comorbidity and body mass index (BMI) of 38.0 to 38.9 in adult, unspecified obesity type (Katrina Beard) Katrina Beard is currently in the action stage of change. As such, her goal is to continue with weight loss efforts. She has agreed to practicing portion control and making smarter food choices, such as increasing vegetables and decreasing simple carbohydrates.   Exercise goals: As is.  Behavioral modification strategies: decreasing simple carbohydrates and decreasing eating out.  Katrina Beard has agreed to follow-up with our clinic in 3 weeks.   Objective:   Blood pressure (!) 142/81, pulse 80, temperature 98 F (36.7 C), height 5\' 1"  (1.549 m), weight 202 lb (91.6 kg), SpO2 100 %. Body mass index is 38.[place]  kg/m.  General: Cooperative, alert, well developed, in no acute distress. HEENT: Conjunctivae and lids unremarkable. Cardiovascular: Regular rhythm.  Lungs: Normal work of breathing. Neurologic: No focal deficits.   Lab Results  Component Value Date   CREATININE 0.95 08/06/2019   BUN 14 08/06/2019   NA 140 08/06/2019   K 4.3 08/06/2019   CL 103 08/06/2019   CO2 21 08/06/2019   Lab Results  Component  Value Date   ALT 19 08/06/2019   AST 18 08/06/2019   ALKPHOS 108 08/06/2019   BILITOT 0.4 08/06/2019   Lab Results  Component Value Date   HGBA1C 5.9 (H) 01/08/2020   HGBA1C 6.3 (H) 08/06/2019   HGBA1C 7.2 (H) 04/03/2019   Lab Results  Component Value Date   INSULIN 11.2 04/03/2019   Lab Results  Component Value Date   TSH 0.575 04/03/2019   Lab Results  Component Value Date   CHOL 153 04/03/2019   HDL 51 04/03/2019   LDLCALC 85 04/03/2019   TRIG 91 04/03/2019   Lab Results  Component Value Date   WBC 4.2 01/08/2020   HGB 15.0 01/08/2020   HCT 48.8 (H) 01/08/2020   MCV 79 01/08/2020   PLT 302 01/08/2020   Lab Results  Component Value Date   IRON 28 01/08/2020   TIBC 324 01/08/2020   FERRITIN 5 (L) 01/08/2020   Attestation Statements:   Reviewed by clinician on day of visit: allergies, medications, problem list, medical history, surgical history, family history, social history, and previous encounter notes.   Wilhemena Durie, am acting as Location manager for [employer], FNP-C.  I have reviewed the above documentation for accuracy and completeness, and I agree with the above. -  Georgianne Fick, FNP

## 2020-04-22 ENCOUNTER — Encounter (INDEPENDENT_AMBULATORY_CARE_PROVIDER_SITE_OTHER): Payer: Self-pay | Admitting: Family Medicine

## 2020-04-25 ENCOUNTER — Encounter (INDEPENDENT_AMBULATORY_CARE_PROVIDER_SITE_OTHER): Payer: Self-pay

## 2020-04-25 DIAGNOSIS — N939 Abnormal uterine and vaginal bleeding, unspecified: Secondary | ICD-10-CM | POA: Insufficient documentation

## 2020-04-25 DIAGNOSIS — Z9884 Bariatric surgery status: Secondary | ICD-10-CM | POA: Insufficient documentation

## 2020-04-25 DIAGNOSIS — N182 Chronic kidney disease, stage 2 (mild): Secondary | ICD-10-CM | POA: Insufficient documentation

## 2020-04-25 DIAGNOSIS — I2510 Atherosclerotic heart disease of native coronary artery without angina pectoris: Secondary | ICD-10-CM | POA: Insufficient documentation

## 2020-05-12 ENCOUNTER — Other Ambulatory Visit: Payer: Self-pay

## 2020-05-12 ENCOUNTER — Ambulatory Visit (INDEPENDENT_AMBULATORY_CARE_PROVIDER_SITE_OTHER): Payer: BC Managed Care – PPO | Admitting: Family Medicine

## 2020-05-12 ENCOUNTER — Encounter (INDEPENDENT_AMBULATORY_CARE_PROVIDER_SITE_OTHER): Payer: Self-pay | Admitting: Family Medicine

## 2020-05-12 VITALS — BP 153/85 | HR 70 | Temp 97.5°F | Ht 61.0 in | Wt 197.0 lb

## 2020-05-12 DIAGNOSIS — Z6837 Body mass index (BMI) 37.0-37.9, adult: Secondary | ICD-10-CM | POA: Diagnosis not present

## 2020-05-12 DIAGNOSIS — Z9189 Other specified personal risk factors, not elsewhere classified: Secondary | ICD-10-CM | POA: Diagnosis not present

## 2020-05-12 DIAGNOSIS — E559 Vitamin D deficiency, unspecified: Secondary | ICD-10-CM

## 2020-05-12 DIAGNOSIS — E1169 Type 2 diabetes mellitus with other specified complication: Secondary | ICD-10-CM | POA: Diagnosis not present

## 2020-05-12 MED ORDER — VITAMIN D (ERGOCALCIFEROL) 1.25 MG (50000 UNIT) PO CAPS: 50000.0000 [IU] | ORAL_CAPSULE | ORAL | 0 refills | Status: DC

## 2020-05-12 MED ORDER — METFORMIN HCL 500 MG PO TABS: 500.0000 mg | ORAL_TABLET | Freq: Every day | ORAL | 0 refills | Status: DC

## 2020-05-12 MED ORDER — WEGOVY 1.7 MG/0.75ML ~~LOC~~ SOAJ
1.7000 mg | SUBCUTANEOUS | 0 refills | Status: DC
Start: 2020-05-12 — End: 2020-06-03

## 2020-05-13 ENCOUNTER — Encounter (INDEPENDENT_AMBULATORY_CARE_PROVIDER_SITE_OTHER): Payer: Self-pay | Admitting: Family Medicine

## 2020-05-13 NOTE — Progress Notes (Signed)
OBESITY Katrina Beard is here to discuss her progress with her obesity treatment plan along with follow-up of her obesity related diagnoses. Katrina Beard is on practicing portion control and making smarter food choices, such as increasing vegetables and decreasing simple carbohydrates and states she is following her eating plan approximately 30% of the time. Katrina Beard states she is walking for 20 minutes 2 times per week.  Today's visit was #: 21 Starting weight: 231 lbs Starting date: 04/03/2019 Today's weight: 197 lbs Today's date: 05/12/2020 Total lbs lost to date: 34 Total lbs lost since last in-office visit: 5  Interim History: Katrina Beard notes the Wegovy 1.7 mg helps with appetite tremendously. She is still eating, but her portions are smaller. She is eating 3 meals per day and leaving carbohydrates behind sometimes. She notes occasional nausea.  She is supposed to close on her new house March 4th.  Subjective:   1. Type 2 diabetes mellitus with other specified complication, without long-term current use of insulin (HCC) Katrina Beard is on Wegovy and metformin. Her fasting BGs range in the 90's. She denies hypoglycemia.  Lab Results  Component Value Date   HGBA1C 5.9 (H) 01/08/2020   HGBA1C 6.3 (H) 08/06/2019   HGBA1C 7.2 (H) 04/03/2019   Lab Results  Component Value Date   LDLCALC 85 04/03/2019   CREATININE 0.95 08/06/2019   Lab Results  Component Value Date   INSULIN 11.2 04/03/2019   2. Vitamin D deficiency Katrina Beard's last Vit D level was at goal at 55.2. She is on weekly prescription Vit D.  3. At risk for side effect of medication Katrina Beard is at risk for drug side effects due to [facility].  Assessment/Plan:   1. Type 2 diabetes mellitus with other specified complication, without long-term current use of insulin (HCC)  Phylicia will continue Wegovy, and we will refill metformin for 1 month.  - metFORMIN (GLUCOPHAGE) 500 MG tablet; Take 1 tablet (500 mg total) by mouth daily with breakfast.  Dispense: 30  tablet; Refill: 0  2. Vitamin D deficiency . We will refill prescription Vitamin D for 1 month. Florentina will follow-up for routine testing of Vitamin D, at least 2-3 times per year to avoid over-replacement.  - Vitamin D, Ergocalciferol, (DRISDOL) 1.25 MG (50000 UNIT) CAPS capsule; Take 1 capsule (50,000 Units total) by mouth every 7 (seven) days.  Dispense: 4 capsule; Refill: 0  3. At risk for side effect of medication Katrina Beard was given approximately 15 minutes of drug side effect counseling today.  We discussed side effect possibility and risk versus benefits. Katrina Beard agreed to the medication and will contact this office if these side effects are intolerable.  Repetitive spaced learning was employed today to elicit superior memory formation and behavioral change.  4. Class 2 severe obesity with serious comorbidity and body mass index (BMI) of 37.0 to 37.9 in adult, unspecified obesity type ([place]) Katrina Beard is currently in the action stage of change. As such, her goal is to continue with weight loss efforts. She has agreed to practicing portion control and making smarter food choices, such as increasing vegetables and decreasing simple carbohydrates.   We discussed various medication options to help Katrina Beard with her weight loss efforts and we both agreed to continue Wegovy, and we will refill for 1 month.  - Semaglutide-Weight Management (WEGOVY) 1.7 MG/0.75ML SOAJ; Inject 1.7 mg into the skin once a week.  Dispense: 3 mL; Refill: 0  Exercise goals: As is.  Behavioral modification strategies: increasing lean protein intake.  Katrina Beard has agreed  to follow-up with our clinic in 3 weeks. She was informed of the importance of frequent follow-up visits to maximize her success with intensive lifestyle modifications for her multiple health conditions.   Objective:   Blood pressure (!) 153/85, pulse 70, temperature (!) 97.5 F (36.4 C), height 5\' 1"  (1.549 m), weight 197 lb (89.4 kg), SpO2 100 %. Body mass  index is 37.22 kg/m.  General: Cooperative, alert, well developed, in no acute distress. HEENT: Conjunctivae and lids unremarkable. Cardiovascular: Regular rhythm.  Lungs: Normal work of breathing. Neurologic: No focal deficits.   Lab Results  Component Value Date   CREATININE 0.95 08/06/2019   BUN 14 08/06/2019   NA 140 08/06/2019   K 4.3 08/06/2019   CL 103 08/06/2019   CO2 21 08/06/2019   Lab Results  Component Value Date   ALT 19 08/06/2019   AST 18 08/06/2019   ALKPHOS 108 08/06/2019   BILITOT 0.4 08/06/2019   Lab Results  Component Value Date   HGBA1C 5.9 (H) 01/08/2020   HGBA1C 6.3 (H) 08/06/2019   HGBA1C 7.2 (H) 04/03/2019   Lab Results  Component Value Date   INSULIN 11.2 04/03/2019   Lab Results  Component Value Date   TSH 0.575 04/03/2019   Lab Results  Component Value Date   CHOL 153 04/03/2019   HDL 51 04/03/2019   LDLCALC 85 04/03/2019   TRIG 91 04/03/2019   Lab Results  Component Value Date   WBC 4.2 01/08/2020   HGB 15.0 01/08/2020   HCT 48.8 (H) 01/08/2020   MCV 79 01/08/2020   PLT 302 01/08/2020   Lab Results  Component Value Date   IRON 28 01/08/2020   TIBC 324 01/08/2020   FERRITIN 5 (L) 01/08/2020   Attestation Statements:   Reviewed by clinician on day of visit: allergies, medications, problem list, medical history, surgical history, family history, social history, and previous encounter notes.   Wilhemena Durie, am acting as Location manager for [employer], FNP-C.  I have reviewed the above documentation for accuracy and completeness, and I agree with the above. -  Georgianne Fick, FNP

## 2020-05-13 NOTE — Progress Notes (Addendum)
Chief Complaint:   OBESITY Katrina Beard is here to discuss her progress with her obesity treatment plan along with follow-up of her obesity related diagnoses. Katrina Beard is on practicing portion control and making smarter food choices, such as increasing vegetables and decreasing simple carbohydrates and states she is following her eating plan approximately 30% of the time. Katrina Beard states she is walking for 20 minutes 2 times per week.  Today's visit was #: 21 Starting weight: 231 lbs Starting date: 04/03/2019 Today's weight: 197 lbs Today's date: 05/12/2020 Total lbs lost to date: 34 Total lbs lost since last in-office visit: 5  Interim History: Katrina Beard notes the Wegovy 1.7 mg helps with appetite tremendously. She is still eating, but her portions are smaller. She is eating 3 meals per day and leaving carbohydrates sometimes. She notes occasional nausea. She is supposed to close on her new house March 4th.  Subjective:   1. Type 2 diabetes mellitus with other specified complication, without long-term current use of insulin (HCC) Katrina Beard is on Wegovy and metformin. Her fasting BGs range in the 90's. She denies hypoglycemia.  Lab Results  Component Value Date   HGBA1C 5.9 (H) 01/08/2020   HGBA1C 6.3 (H) 08/06/2019   HGBA1C 7.2 (H) 04/03/2019   Lab Results  Component Value Date   LDLCALC 85 04/03/2019   CREATININE 0.95 08/06/2019   Lab Results  Component Value Date   INSULIN 11.2 04/03/2019   2. Vitamin D deficiency Katrina Beard's last Vit D level was at goal at 55.2. She is on weekly prescription Vit D.  3. At risk for side effect of medication Katrina Beard is at risk for drug side effects due to [facility].  Assessment/Plan:   1. Type 2 diabetes mellitus with other specified complication, without long-term current use of insulin (HCC) Good blood sugar control is important to decrease the likelihood of diabetic complications such as nephropathy, neuropathy, limb loss, blindness, coronary artery disease,  and death. Intensive lifestyle modification including diet, exercise and weight loss are the first line of treatment for diabetes. Katrina Beard will continue Wegovy, and we will refill metformin for 1 month.  - metFORMIN (GLUCOPHAGE) 500 MG tablet; Take 1 tablet (500 mg total) by mouth daily with breakfast.  Dispense: 30 tablet; Refill: 0  2. Vitamin D deficiency Low Vitamin D level contributes to fatigue and are associated with obesity, breast, and colon cancer. We will refill prescription Vitamin D for 1 month. Katrina Beard will follow-up for routine testing of Vitamin D, at least 2-3 times per year to avoid over-replacement.  - Vitamin D, Ergocalciferol, (DRISDOL) 1.25 MG (50000 UNIT) CAPS capsule; Take 1 capsule (50,000 Units total) by mouth every 7 (seven) days.  Dispense: 4 capsule; Refill: 0  3. At risk for side effect of medication Katrina Beard was given approximately 15 minutes of drug side effect counseling today.  We discussed side effect possibility and risk versus benefits. Katrina Beard agreed to the medication and will contact this office if these side effects are intolerable.  Repetitive spaced learning was employed today to elicit superior memory formation and behavioral change.  4. Class 2 severe obesity with serious comorbidity and body mass index (BMI) of 37.0 to 37.9 in adult, unspecified obesity type (Katrina Beard) Katrina Beard is currently in the action stage of change. As such, her goal is to continue with weight loss efforts. She has agreed to practicing portion control and making smarter food choices, such as increasing vegetables and decreasing simple carbohydrates.   We discussed various medication options to  help Katrina Beard with her weight loss efforts and we both agreed to continue Wegovy, and we will refill for 1 month.  - Semaglutide-Weight Management (WEGOVY) 1.7 MG/0.75ML SOAJ; Inject 1.7 mg into the skin once a week.  Dispense: 3 mL; Refill: 0  Exercise goals: As is.  Behavioral modification strategies:  increasing lean protein intake.  Katrina Beard has agreed to follow-up with our clinic in 3 weeks. She was informed of the importance of frequent follow-up visits to maximize her success with intensive lifestyle modifications for her multiple health conditions.   Objective:   Blood pressure (!) 153/85, pulse 70, temperature (!) 97.5 F (36.4 C), height 5\' 1"  (1.549 m), weight 197 lb (89.4 kg), SpO2 100 %. Body mass index is 37.22 kg/m.  General: Cooperative, alert, well developed, in no acute distress. HEENT: Conjunctivae and lids unremarkable. Cardiovascular: Regular rhythm.  Lungs: Normal work of breathing. Neurologic: No focal deficits.   Lab Results  Component Value Date   CREATININE 0.95 08/06/2019   BUN 14 08/06/2019   NA 140 08/06/2019   K 4.3 08/06/2019   CL 103 08/06/2019   CO2 21 08/06/2019   Lab Results  Component Value Date   ALT 19 08/06/2019   AST 18 08/06/2019   ALKPHOS 108 08/06/2019   BILITOT 0.4 08/06/2019   Lab Results  Component Value Date   HGBA1C 5.9 (H) 01/08/2020   HGBA1C 6.3 (H) 08/06/2019   HGBA1C 7.2 (H) 04/03/2019   Lab Results  Component Value Date   INSULIN 11.2 04/03/2019   Lab Results  Component Value Date   TSH 0.575 04/03/2019   Lab Results  Component Value Date   CHOL 153 04/03/2019   HDL 51 04/03/2019   LDLCALC 85 04/03/2019   TRIG 91 04/03/2019   Lab Results  Component Value Date   WBC 4.2 01/08/2020   HGB 15.0 01/08/2020   HCT 48.8 (H) 01/08/2020   MCV 79 01/08/2020   PLT 302 01/08/2020   Lab Results  Component Value Date   IRON 28 01/08/2020   TIBC 324 01/08/2020   FERRITIN 5 (L) 01/08/2020   Attestation Statements:   Reviewed by clinician on day of visit: allergies, medications, problem list, medical history, surgical history, family history, social history, and previous encounter notes.   Wilhemena Durie, am acting as Location manager for [employer], FNP-C.  I have reviewed the above documentation for  accuracy and completeness, and I agree with the above. -  Georgianne Fick, FNP

## 2020-06-03 ENCOUNTER — Encounter (INDEPENDENT_AMBULATORY_CARE_PROVIDER_SITE_OTHER): Payer: Self-pay | Admitting: Family Medicine

## 2020-06-03 ENCOUNTER — Other Ambulatory Visit: Payer: Self-pay

## 2020-06-03 ENCOUNTER — Ambulatory Visit (INDEPENDENT_AMBULATORY_CARE_PROVIDER_SITE_OTHER): Payer: BC Managed Care – PPO | Admitting: Family Medicine

## 2020-06-03 VITALS — BP 134/82 | HR 80 | Temp 97.8°F | Ht 61.0 in | Wt 195.0 lb

## 2020-06-03 DIAGNOSIS — Z9189 Other specified personal risk factors, not elsewhere classified: Secondary | ICD-10-CM

## 2020-06-03 DIAGNOSIS — E559 Vitamin D deficiency, unspecified: Secondary | ICD-10-CM

## 2020-06-03 DIAGNOSIS — F3289 Other specified depressive episodes: Secondary | ICD-10-CM | POA: Diagnosis not present

## 2020-06-03 DIAGNOSIS — I10 Essential (primary) hypertension: Secondary | ICD-10-CM

## 2020-06-03 DIAGNOSIS — E66812 Obesity, class 2: Secondary | ICD-10-CM

## 2020-06-03 DIAGNOSIS — Z6837 Body mass index (BMI) 37.0-37.9, adult: Secondary | ICD-10-CM

## 2020-06-03 MED ORDER — WEGOVY 1.7 MG/0.75ML ~~LOC~~ SOAJ
1.7000 mg | SUBCUTANEOUS | 0 refills | Status: DC
Start: 2020-06-03 — End: 2020-06-24

## 2020-06-03 MED ORDER — VITAMIN D (ERGOCALCIFEROL) 1.25 MG (50000 UNIT) PO CAPS: 50000.0000 [IU] | ORAL_CAPSULE | ORAL | 0 refills | Status: DC

## 2020-06-03 MED ORDER — BUPROPION HCL ER (SR) 150 MG PO TB12
150.0000 mg | ORAL_TABLET | ORAL | 0 refills | Status: DC
Start: 2020-06-03 — End: 2020-06-24

## 2020-06-04 ENCOUNTER — Encounter (INDEPENDENT_AMBULATORY_CARE_PROVIDER_SITE_OTHER): Payer: Self-pay | Admitting: Family Medicine

## 2020-06-04 NOTE — Progress Notes (Signed)
Chief Complaint:   OBESITY Katrina Beard is here to discuss her progress with her obesity treatment plan along with follow-up of her obesity related diagnoses. Marvia is on practicing portion control and making smarter food choices, such as increasing vegetables and decreasing simple carbohydrates and states she is following her eating plan approximately 75% of the time. Asharia states she is doing 0 minutes 0 times per week.  Today's visit was #: 22 Starting weight: 231 lbs Starting date: 04/03/2019 Today's weight: 195 lbs Today's date: 06/03/2020 Total lbs lost to date: 36 lbs Total lbs lost since last in-office visit: 2 lbs  Interim History: Katrina Beard is down 2 lbs, despite undergoing a lot of stress due to trying to close on a house. Her appetite is fairly well controlled, though she does admit to wanting to snack. She does have some nausea when she eats certain things. She does not always have protein at all meals.  Subjective:   1. Other depression, with emotional eating Lateya's mood is stable, although she is annoyed with trying to close on a house.  2. Essential hypertension Katrina Beard's BP is improved today at 134/82. She is on losartan 100 mg QD.  BP Readings from Last 3 Encounters:  06/03/20 134/82  05/12/20 (!) 153/85  04/21/20 (!) 142/81    3. Vitamin D deficiency Katrina Beard's Vitamin D level was 55.2 on 01/08/2020. She is currently taking prescription vitamin D 50,000 IU each week. She denies nausea, vomiting or muscle weakness.   Ref. Range 01/08/2020 13:37  Vitamin D, 25-Hydroxy Latest Ref Range: 30.0 - 100.0 ng/mL 55.2   4. At risk for malnutrition Baker is at increased risk for malnutrition due to inadequate nutrition.  Assessment/Plan:   1. Other depression, with emotional eating  Orders and follow up as documented in patient record.   - buPROPion (WELLBUTRIN SR) 150 MG 12 hr tablet; Take 1 tablet (150 mg total) by mouth every morning.  Dispense: 30 tablet; Refill: 0  2.  Essential hypertension . Continue losartan.  3. Vitamin D deficiency . She agrees to continue to take prescription Vitamin D @50 ,000 IU every week and will follow-up for routine testing of Vitamin D, at least 2-3 times per year to avoid over-replacement.  - Vitamin D, Ergocalciferol, (DRISDOL) 1.25 MG (50000 UNIT) CAPS capsule; Take 1 capsule (50,000 Units total) by mouth every 7 (seven) days.  Dispense: 4 capsule; Refill: 0  4. At risk for malnutrition Katrina Beard was given approximately 15 minutes of counseling today regarding prevention of malnutrition and ways to meet macronutrient goals..   5. Class 2 severe obesity with serious comorbidity and body mass index (BMI) of 37.0 to 37.9 in adult, unspecified obesity type (HCC) Katrina Beard is currently in the action stage of change. As such, her goal is to continue with weight loss efforts. She has agreed to practicing portion control and making smarter food choices, such as increasing vegetables and decreasing simple carbohydrates.   We discussed various medication options to help Jai with her weight loss efforts and we both agreed to continue [facility], as per below. - Semaglutide-Weight Management (WEGOVY) 1.7 MG/0.75ML SOAJ; Inject 1.7 mg into the skin once a week.  Dispense: 3 mL; Refill: 0  Exercise goals: All adults should avoid inactivity. Some physical activity is better than none, and adults who participate in any amount of physical activity gain some health benefits.  Behavioral modification strategies: increasing lean protein intake.  Paul has agreed to follow-up with our clinic in 3 weeks.  She was informed of the importance of frequent follow-up visits to maximize her success with intensive lifestyle modifications for her multiple health conditions.   Objective:   Blood pressure 134/82, pulse 80, temperature 97.8 F (36.6 C), height 5\' 1"  (1.549 m), weight 195 lb (88.5 kg), SpO2 99 %. Body mass index is 36.84 kg/m.  General:  Cooperative, alert, well developed, in no acute distress. HEENT: Conjunctivae and lids unremarkable. Cardiovascular: Regular rhythm.  Lungs: Normal work of breathing. Neurologic: No focal deficits.   Lab Results  Component Value Date   CREATININE 0.95 08/06/2019   BUN 14 08/06/2019   NA 140 08/06/2019   K 4.3 08/06/2019   CL 103 08/06/2019   CO2 21 08/06/2019   Lab Results  Component Value Date   ALT 19 08/06/2019   AST 18 08/06/2019   ALKPHOS 108 08/06/2019   BILITOT 0.4 08/06/2019   Lab Results  Component Value Date   HGBA1C 5.9 (H) 01/08/2020   HGBA1C 6.3 (H) 08/06/2019   HGBA1C 7.2 (H) 04/03/2019   Lab Results  Component Value Date   INSULIN 11.2 04/03/2019   Lab Results  Component Value Date   TSH 0.575 04/03/2019   Lab Results  Component Value Date   CHOL 153 04/03/2019   HDL 51 04/03/2019   LDLCALC 85 04/03/2019   TRIG 91 04/03/2019   Lab Results  Component Value Date   WBC 4.2 01/08/2020   HGB 15.0 01/08/2020   HCT 48.8 (H) 01/08/2020   MCV 79 01/08/2020   PLT 302 01/08/2020   Lab Results  Component Value Date   IRON 28 01/08/2020   TIBC 324 01/08/2020   FERRITIN 5 (L) 01/08/2020    Attestation Statements:   Reviewed by clinician on day of visit: allergies, medications, problem list, medical history, surgical history, family history, social history, and previous encounter notes.  Coral Ceo, am acting as Location manager for [employer], [place].  I have reviewed the above documentation for accuracy and completeness, and I agree with the above. -  Georgianne Fick, FNP

## 2020-06-24 ENCOUNTER — Encounter (INDEPENDENT_AMBULATORY_CARE_PROVIDER_SITE_OTHER): Payer: Self-pay | Admitting: Family Medicine

## 2020-06-24 ENCOUNTER — Other Ambulatory Visit: Payer: Self-pay

## 2020-06-24 ENCOUNTER — Ambulatory Visit (INDEPENDENT_AMBULATORY_CARE_PROVIDER_SITE_OTHER): Payer: BC Managed Care – PPO | Admitting: Family Medicine

## 2020-06-24 VITALS — BP 128/85 | HR 77 | Temp 98.3°F | Ht 61.0 in | Wt 194.0 lb

## 2020-06-24 DIAGNOSIS — F3289 Other specified depressive episodes: Secondary | ICD-10-CM

## 2020-06-24 DIAGNOSIS — D509 Iron deficiency anemia, unspecified: Secondary | ICD-10-CM | POA: Diagnosis not present

## 2020-06-24 DIAGNOSIS — Z9189 Other specified personal risk factors, not elsewhere classified: Secondary | ICD-10-CM

## 2020-06-24 DIAGNOSIS — Z6841 Body Mass Index (BMI) 40.0 and over, adult: Secondary | ICD-10-CM

## 2020-06-24 DIAGNOSIS — E1169 Type 2 diabetes mellitus with other specified complication: Secondary | ICD-10-CM

## 2020-06-24 DIAGNOSIS — E559 Vitamin D deficiency, unspecified: Secondary | ICD-10-CM | POA: Diagnosis not present

## 2020-06-24 DIAGNOSIS — E785 Hyperlipidemia, unspecified: Secondary | ICD-10-CM

## 2020-06-24 MED ORDER — WEGOVY 1.7 MG/0.75ML ~~LOC~~ SOAJ: 1.7000 mg | SUBCUTANEOUS | 0 refills | Status: DC

## 2020-06-24 MED ORDER — BUPROPION HCL ER (SR) 150 MG PO TB12: 150.0000 mg | ORAL_TABLET | ORAL | 0 refills | Status: DC

## 2020-06-24 MED ORDER — METFORMIN HCL 500 MG PO TABS: 500.0000 mg | ORAL_TABLET | Freq: Every day | ORAL | 0 refills | Status: DC

## 2020-06-24 MED ORDER — VITAMIN D (ERGOCALCIFEROL) 1.25 MG (50000 UNIT) PO CAPS: 50000.0000 [IU] | ORAL_CAPSULE | ORAL | 0 refills | Status: DC

## 2020-06-25 LAB — VITAMIN B12: Vitamin B-12: >2000 pg/mL — ABNORMAL HIGH (ref 232–1245)

## 2020-06-25 LAB — LIPID PANEL WITH LDL/HDL RATIO
Cholesterol, Total: 133 mg/dL (ref 100–199)
HDL: 43 mg/dL (ref >39)
LDL Chol Calc (NIH): 75 mg/dL (ref 0–99)
LDL/HDL Ratio: 1.7 ratio (ref 0.0–3.2)
Triglycerides: 75 mg/dL (ref 0–149)
VLDL Cholesterol Cal: 15 mg/dL (ref 5–40)

## 2020-06-25 LAB — HEMOGLOBIN A1C
Est. average glucose Bld gHb Est-mCnc: 126 mg/dL
Hgb A1c MFr Bld: 6 % — ABNORMAL HIGH (ref 4.8–5.6)

## 2020-06-25 LAB — CBC WITH DIFFERENTIAL/PLATELET
Basophils Absolute: 0.1 10*3/uL (ref 0.0–0.2)
Basos: 1 %
EOS (ABSOLUTE): 0.1 10*3/uL (ref 0.0–0.4)
Eos: 1 %
Hematocrit: 35.1 % (ref 34.0–46.6)
Hemoglobin: 11.7 g/dL (ref 11.1–15.9)
Immature Grans (Abs): 0 10*3/uL (ref 0.0–0.1)
Immature Granulocytes: 1 %
Lymphocytes Absolute: 2.5 10*3/uL (ref 0.7–3.1)
Lymphs: 35 %
MCH: 28.7 pg (ref 26.6–33.0)
MCHC: 33.3 g/dL (ref 31.5–35.7)
MCV: 86 fL (ref 79–97)
Monocytes Absolute: 0.5 10*3/uL (ref 0.1–0.9)
Monocytes: 7 %
Neutrophils Absolute: 4 10*3/uL (ref 1.4–7.0)
Neutrophils: 55 %
Platelets: 357 10*3/uL (ref 150–450)
RBC: 4.08 x10E6/uL (ref 3.77–5.28)
RDW: 14.9 % (ref 11.7–15.4)
WBC: 7.2 10*3/uL (ref 3.4–10.8)

## 2020-06-25 LAB — COMPREHENSIVE METABOLIC PANEL
ALT: 22 IU/L (ref 0–32)
AST: 18 IU/L (ref 0–40)
Albumin/Globulin Ratio: 1.4 (ref 1.2–2.2)
Albumin: 3.7 g/dL — ABNORMAL LOW (ref 3.8–4.9)
Alkaline Phosphatase: 95 IU/L (ref 44–121)
BUN/Creatinine Ratio: 14 (ref 9–23)
BUN: 15 mg/dL (ref 6–24)
Bilirubin Total: 0.4 mg/dL (ref 0.0–1.2)
CO2: 22 mmol/L (ref 20–29)
Calcium: 8.7 mg/dL (ref 8.7–10.2)
Chloride: 103 mmol/L (ref 96–106)
Creatinine, Ser: 1.08 mg/dL — ABNORMAL HIGH (ref 0.57–1.00)
Globulin, Total: 2.7 g/dL (ref 1.5–4.5)
Glucose: 79 mg/dL (ref 65–99)
Potassium: 3.9 mmol/L (ref 3.5–5.2)
Sodium: 141 mmol/L (ref 134–144)
Total Protein: 6.4 g/dL (ref 6.0–8.5)
eGFR: 60 mL/min/{1.73_m2} (ref >59)

## 2020-06-25 LAB — FERRITIN: Ferritin: 64 ng/mL (ref 15–150)

## 2020-06-25 LAB — IRON AND TIBC
Iron Saturation: 32 % (ref 15–55)
Iron: 81 ug/dL (ref 27–159)
Total Iron Binding Capacity: 252 ug/dL (ref 250–450)
UIBC: 171 ug/dL (ref 131–425)

## 2020-06-25 LAB — VITAMIN D 25 HYDROXY (VIT D DEFICIENCY, FRACTURES): Vit D, 25-Hydroxy: 44.4 ng/mL (ref 30.0–100.0)

## 2020-06-25 LAB — FOLATE: Folate: 8.7 ng/mL (ref >3.0)

## 2020-06-26 ENCOUNTER — Encounter (INDEPENDENT_AMBULATORY_CARE_PROVIDER_SITE_OTHER): Payer: Self-pay | Admitting: Family Medicine

## 2020-06-26 NOTE — Progress Notes (Signed)
Chief Complaint:   OBESITY Katrina Beard is here to discuss her progress with her obesity treatment plan along with follow-up of her obesity related diagnoses. Katrina Beard is on practicing portion control and making smarter food choices, such as increasing vegetables and decreasing simple carbohydrates and states she is following her eating plan approximately 60% of the time. Katrina Beard states she is not exercising regularly.  Today's visit was #: 23 Starting weight: 231 lbs Starting date: 04/03/2019 Today's weight: 194 lbs Today's date: 06/24/2020 Total lbs lost to date: 37 lbs Total lbs lost since last in-office visit: 1 lb  Interim History: Katrina Beard's protein intake is better.  She denies skipping meals.  She is not snacking as she used to since starting Wegovy.  Subjective:   1. Type 2 diabetes mellitus with other specified complication, without long-term current use of insulin (HCC) Well controlled.  On Wegovy 1.7 mg and metformin.  Denies hypoglycemia.  Lab Results  Component Value Date   HGBA1C 6.0 (H) 06/24/2020   HGBA1C 5.9 (H) 01/08/2020   HGBA1C 6.3 (H) 08/06/2019   Lab Results  Component Value Date   LDLCALC 75 06/24/2020   CREATININE 1.08 (H) 06/24/2020   Lab Results  Component Value Date   INSULIN 11.2 04/03/2019   2. Iron deficiency anemia, unspecified iron deficiency anemia type She is on iron once daily. Last Hgb WNL.  CBC Latest Ref Rng & Units 06/24/2020 01/08/2020 04/03/2019  WBC 3.4 - 10.8 x10E3/uL 7.2 4.2 6.9  Hemoglobin 11.1 - 15.9 g/dL 11.7 15.0 11.8  Hematocrit 34.0 - 46.6 % 35.1 48.8(H) 37.9  Platelets 150 - 450 x10E3/uL 357 302 331   Lab Results  Component Value Date   IRON 81 06/24/2020   TIBC 252 06/24/2020   FERRITIN 64 06/24/2020   Lab Results  Component Value Date   VITAMINB12 >2000 (H) 06/24/2020   3. Hyperlipidemia associated with type 2 diabetes mellitus ([place]) She notes she has had elevated cholesterol in the past.  She is on a statin.  Lab  Results  Component Value Date   ALT 22 06/24/2020   AST 18 06/24/2020   ALKPHOS 95 06/24/2020   BILITOT 0.4 06/24/2020   Lab Results  Component Value Date   CHOL 133 06/24/2020   HDL 43 06/24/2020   LDLCALC 75 06/24/2020   TRIG 75 06/24/2020  The 10-year ASCVD risk score Katrina Bussing DC Jr., et al., 2013) is: 11%   Values used to calculate the score:     Age: 60 years     Sex: Female     Is Non-Hispanic African American: Yes     Diabetic: Yes     Tobacco smoker: No     Systolic Blood Pressure: 734 mmHg     Is BP treated: Yes     HDL Cholesterol: 43 mg/dL     Total Cholesterol: 133 mg/dL  4. Vitamin D deficiency Vitamin D at goal.  On weekly vitamin D 50,000 IU.  5. Other depression, with emotional eating Mood stable  Cravings well controlled.  6. At risk for malnutrition Katrina Beard is at increased risk for malnutrition due to history of gastric bypass, not consistently taking multivitamin.  Assessment/Plan:   1. Type 2 diabetes mellitus with other specified complication, without long-term current use of insulin (HCC) Refill metformin 500 mg daily, as per below.  Check A1c today.  - Comprehensive metabolic panel - Hemoglobin A1c - Lipid Panel With LDL/HDL Ratio - Refill metFORMIN (GLUCOPHAGE) 500 MG tablet; Take 1  tablet (500 mg total) by mouth daily with breakfast.  Dispense: 30 tablet; Refill: 0  2. Iron deficiency anemia, unspecified iron deficiency anemia type Check CBC and anemia profile.  - Vitamin B12 - Folate - Iron and TIBC - Ferritin - CBC with Differential/Platelet  3. Hyperlipidemia associated with type 2 diabetes mellitus (Katrina Beard) Check FLP today.  4. Vitamin D deficiency Check vitamin D level today.  Refill vitamin D 50,000 IU weekly.  - VITAMIN D 25 Hydroxy (Vit-D Deficiency, Fractures) - Refill Vitamin D, Ergocalciferol, (DRISDOL) 1.25 MG (50000 UNIT) CAPS capsule; Take 1 capsule (50,000 Units total) by mouth every 7 (seven) days.  Dispense: 4 capsule;  Refill: 0  5. Other depression, with emotional eating Refill bupropion 150 daily, as per below.  - Refill buPROPion (WELLBUTRIN SR) 150 MG 12 hr tablet; Take 1 tablet (150 mg total) by mouth every morning.  Dispense: 30 tablet; Refill: 0  6. At risk for malnutrition Katrina Beard was given approximately 15 minutes of counseling today regarding prevention of malnutrition and ways to meet macronutrient goals.  Plan:  Take multivitamin twice daily.  7. Obesity: Current BMI 36  - Refill Semaglutide-Weight Management (WEGOVY) 1.7 MG/0.75ML SOAJ; Inject 1.7 mg into the skin once a week.  Dispense: 3 mL; Refill: 0  Katrina Beard is currently in the action stage of change. As such, her goal is to continue with weight loss efforts. She has agreed to practicing portion control and making smarter food choices, such as increasing vegetables and decreasing simple carbohydrates.   Exercise goals: No exercise has been prescribed at this time.  Behavioral modification strategies: increasing lean protein intake.  Katrina Beard has agreed to follow-up with our clinic in 3 weeks.  Katrina Beard was informed we would discuss her lab results at her next visit unless there is a critical issue that needs to be addressed sooner. Katrina Beard agreed to keep her next visit at the agreed upon time to discuss these results.  Objective:   Blood pressure 128/85, pulse 77, temperature 98.3 F (36.8 C), height 5\' 1"  (1.549 m), weight 194 lb (88 kg), SpO2 97 %. Body mass index is 36.66 kg/m.  General: Cooperative, alert, well developed, in no acute distress. HEENT: Conjunctivae and lids unremarkable. Cardiovascular: Regular rhythm.  Lungs: Normal work of breathing. Neurologic: No focal deficits.   Lab Results  Component Value Date   CREATININE 1.08 (H) 06/24/2020   BUN 15 06/24/2020   NA 141 06/24/2020   K 3.9 06/24/2020   CL 103 06/24/2020   CO2 22 06/24/2020   Lab Results  Component Value Date   ALT 22 06/24/2020   AST 18 06/24/2020    ALKPHOS 95 06/24/2020   BILITOT 0.4 06/24/2020   Lab Results  Component Value Date   HGBA1C 6.0 (H) 06/24/2020   HGBA1C 5.9 (H) 01/08/2020   HGBA1C 6.3 (H) 08/06/2019   HGBA1C 7.2 (H) 04/03/2019   Lab Results  Component Value Date   INSULIN 11.2 04/03/2019   Lab Results  Component Value Date   TSH 0.575 04/03/2019   Lab Results  Component Value Date   CHOL 133 06/24/2020   HDL 43 06/24/2020   LDLCALC 75 06/24/2020   TRIG 75 06/24/2020   Lab Results  Component Value Date   WBC 7.2 06/24/2020   HGB 11.7 06/24/2020   HCT 35.1 06/24/2020   MCV 86 06/24/2020   PLT 357 06/24/2020   Lab Results  Component Value Date   IRON 81 06/24/2020   TIBC 252 06/24/2020  FERRITIN 64 06/24/2020   Attestation Statements:   Reviewed by clinician on day of visit: allergies, medications, problem list, medical history, surgical history, family history, social history, and previous encounter notes.  I, Water quality scientist, CMA, am acting as Location manager for [employer], [place].  I have reviewed the above documentation for accuracy and completeness, and I agree with the above. -  Georgianne Fick, FNP

## 2020-07-17 ENCOUNTER — Encounter (INDEPENDENT_AMBULATORY_CARE_PROVIDER_SITE_OTHER): Payer: Self-pay | Admitting: Family Medicine

## 2020-07-17 ENCOUNTER — Ambulatory Visit (INDEPENDENT_AMBULATORY_CARE_PROVIDER_SITE_OTHER): Payer: BC Managed Care – PPO | Admitting: Family Medicine

## 2020-07-17 ENCOUNTER — Other Ambulatory Visit: Payer: Self-pay

## 2020-07-17 VITALS — BP 153/77 | HR 73 | Temp 98.1°F | Ht 61.0 in | Wt 192.0 lb

## 2020-07-17 DIAGNOSIS — E1169 Type 2 diabetes mellitus with other specified complication: Secondary | ICD-10-CM

## 2020-07-17 DIAGNOSIS — E559 Vitamin D deficiency, unspecified: Secondary | ICD-10-CM | POA: Diagnosis not present

## 2020-07-17 DIAGNOSIS — Z9189 Other specified personal risk factors, not elsewhere classified: Secondary | ICD-10-CM

## 2020-07-17 DIAGNOSIS — Z6841 Body Mass Index (BMI) 40.0 and over, adult: Secondary | ICD-10-CM

## 2020-07-17 DIAGNOSIS — F3289 Other specified depressive episodes: Secondary | ICD-10-CM | POA: Diagnosis not present

## 2020-07-17 MED ORDER — BUPROPION HCL ER (SR) 150 MG PO TB12: 150.0000 mg | ORAL_TABLET | ORAL | 0 refills | Status: DC

## 2020-07-17 MED ORDER — METFORMIN HCL 500 MG PO TABS
500.0000 mg | ORAL_TABLET | Freq: Every day | ORAL | 0 refills | Status: DC
Start: 2020-07-17 — End: 2020-09-09

## 2020-07-17 MED ORDER — WEGOVY 2.4 MG/0.75ML ~~LOC~~ SOAJ
2.4000 mg | SUBCUTANEOUS | 0 refills | Status: DC
Start: 2020-07-17 — End: 2020-08-12

## 2020-07-21 ENCOUNTER — Encounter (INDEPENDENT_AMBULATORY_CARE_PROVIDER_SITE_OTHER): Payer: Self-pay | Admitting: Family Medicine

## 2020-07-21 NOTE — Progress Notes (Signed)
Chief Complaint:   OBESITY Katrina Beard is here to discuss her progress with her obesity treatment plan along with follow-up of her obesity related diagnoses. Katrina Beard is on practicing portion control and making smarter food choices, such as increasing vegetables and decreasing simple carbohydrates and states she is following her eating plan approximately 75% of the time. Katrina Beard states she is walking 25 minutes 2 times per week.  Today's visit was #: 24 Starting weight: 231 lbs Starting date: 04/03/2019 Today's weight: 192 lbs Today's date: 07/17/2020 Total lbs lost to date: 39 lbs Total lbs lost since last in-office visit: 2  Interim History: Katrina Beard feels she is having more cravings recently. She has been eating out a bit too much, she says. However, she is down 2 lbs today.  Subjective:   1. Other depression, with emotional eating Mood stable. Katrina Beard is on bupropion. She notes increase in cravings.  2. Type 2 diabetes mellitus with other specified complication, without long-term current use of insulin (HCC) Well controlled. Katrina Beard's last A1c was 6.0. She is on Metformin and Wegovy.   Lab Results  Component Value Date   HGBA1C 6.0 (H) 06/24/2020   HGBA1C 5.9 (H) 01/08/2020   HGBA1C 6.3 (H) 08/06/2019   Lab Results  Component Value Date   LDLCALC 75 06/24/2020   CREATININE 1.08 (H) 06/24/2020   Lab Results  Component Value Date   INSULIN 11.2 04/03/2019    3. Vitamin D deficiency Katrina Beard's Vit D has been low. She is on weekly prescription Vit D, but she often forgets to take it.  4. At risk for medication nonadherence Katrina Beard is at risk for medication non-adherence. Has been forgetting to take Vit D supplement.  Assessment/Plan:   1. Other depression, with emotional eating Refill:  - buPROPion (WELLBUTRIN SR) 150 MG 12 hr tablet; Take 1 tablet (150 mg total) by mouth every morning.  Dispense: 30 tablet; Refill: 0  2. Type 2 diabetes mellitus with other specified complication,  without long-term current use of insulin (HCC)   Increase dose of Wegovy to 2.4 mg weekly, as prescribed below.  - metFORMIN (GLUCOPHAGE) 500 MG tablet; Take 1 tablet (500 mg total) by mouth daily with breakfast.  Dispense: 30 tablet; Refill: 0 - Semaglutide-Weight Management (WEGOVY) 2.4 MG/0.75ML SOAJ; Inject 2.4 mg into the skin once a week.  Dispense: 3 mL; Refill: 0  3. Vitamin D deficiency  She agrees to continue to take prescription Vitamin D @50 ,000 IU every week and will follow-up for routine testing of Vitamin D, at least 2-3 times per year to avoid over-replacement. We discussed pt getting a pill box to increase adherence to Vit D.  4. At risk for medication nonadherence Katrina Beard was given approximately 15 minutes of counseling today to help her avoid medication non-adherence.  We discussed importance of taking medications at a similar time each day and the use of daily pill organizers to help improve medication adherence.  Repetitive spaced learning was employed today to elicit superior memory formation and behavioral change.  5. Obesity: Current BMI 36.3  Katrina Beard is currently in the action stage of change. As such, her goal is to continue with weight loss efforts. She has agreed to practicing portion control and making smarter food choices, such as increasing vegetables and decreasing simple carbohydrates.   Continue Wegovy, as prescribed below. Increase dose to 2.4 mg weekly. - Semaglutide-Weight Management (WEGOVY) 2.4 MG/0.75ML SOAJ; Inject 2.4 mg into the skin once a week.  Dispense: 3 mL; Refill:  0  Exercise goals: For substantial health benefits, adults should do at least 150 minutes (2 hours and 30 minutes) a week of moderate-intensity, or 75 minutes (1 hour and 15 minutes) a week of vigorous-intensity aerobic physical activity, or an equivalent combination of moderate- and vigorous-intensity aerobic activity. Aerobic activity should be performed in episodes of at least 10  minutes, and preferably, it should be spread throughout the week.  Behavioral modification strategies: decreasing eating out.  Katrina Beard has agreed to follow-up with our clinic in 3 weeks.   Objective:   Blood pressure (!) 153/77, pulse 73, temperature 98.1 F (36.7 C), height 5\' 1"  (1.549 m), weight 192 lb (87.1 kg), SpO2 99 %. Body mass index is 36.28 kg/m.  General: Cooperative, alert, well developed, in no acute distress. HEENT: Conjunctivae and lids unremarkable. Cardiovascular: Regular rhythm.  Lungs: Normal work of breathing. Neurologic: No focal deficits.   Lab Results  Component Value Date   CREATININE 1.08 (H) 06/24/2020   BUN 15 06/24/2020   NA 141 06/24/2020   K 3.9 06/24/2020   CL 103 06/24/2020   CO2 22 06/24/2020   Lab Results  Component Value Date   ALT 22 06/24/2020   AST 18 06/24/2020   ALKPHOS 95 06/24/2020   BILITOT 0.4 06/24/2020   Lab Results  Component Value Date   HGBA1C 6.0 (H) 06/24/2020   HGBA1C 5.9 (H) 01/08/2020   HGBA1C 6.3 (H) 08/06/2019   HGBA1C 7.2 (H) 04/03/2019   Lab Results  Component Value Date   INSULIN 11.2 04/03/2019   Lab Results  Component Value Date   TSH 0.575 04/03/2019   Lab Results  Component Value Date   CHOL 133 06/24/2020   HDL 43 06/24/2020   LDLCALC 75 06/24/2020   TRIG 75 06/24/2020   Lab Results  Component Value Date   WBC 7.2 06/24/2020   HGB 11.7 06/24/2020   HCT 35.1 06/24/2020   MCV 86 06/24/2020   PLT 357 06/24/2020   Lab Results  Component Value Date   IRON 81 06/24/2020   TIBC 252 06/24/2020   FERRITIN 64 06/24/2020     Attestation Statements:   Reviewed by clinician on day of visit: allergies, medications, problem list, medical history, surgical history, family history, social history, and previous encounter notes.  Coral Ceo, CMA, am acting as Location manager for [employer], [place].  I have reviewed the above documentation for accuracy and completeness, and I agree with  the above. -  Georgianne Fick, FNP

## 2020-08-11 ENCOUNTER — Other Ambulatory Visit: Payer: Self-pay

## 2020-08-11 ENCOUNTER — Encounter (HOSPITAL_COMMUNITY): Payer: Self-pay

## 2020-08-11 ENCOUNTER — Ambulatory Visit (HOSPITAL_COMMUNITY)
Admission: EM | Admit: 2020-08-11 | Discharge: 2020-08-11 | Disposition: A | Discharge status: Home / Self Care | Payer: BC Managed Care – PPO | Attending: Family Medicine | Admitting: Family Medicine

## 2020-08-11 DIAGNOSIS — J3089 Other allergic rhinitis: Secondary | ICD-10-CM | POA: Insufficient documentation

## 2020-08-11 DIAGNOSIS — J3489 Other specified disorders of nose and nasal sinuses: Secondary | ICD-10-CM | POA: Insufficient documentation

## 2020-08-11 DIAGNOSIS — N898 Other specified noninflammatory disorders of vagina: Secondary | ICD-10-CM | POA: Insufficient documentation

## 2020-08-11 MED ORDER — CETIRIZINE HCL 10 MG PO TABS: 10.0000 mg | ORAL_TABLET | Freq: Every day | ORAL | 2 refills | Status: DC

## 2020-08-11 MED ORDER — FLUTICASONE PROPIONATE 50 MCG/ACT NA SUSP: 1.0000 | Freq: Two times a day (BID) | NASAL | 2 refills | Status: AC

## 2020-08-11 MED ORDER — PREDNISONE 20 MG PO TABS: 40.0000 mg | ORAL_TABLET | Freq: Every day | ORAL | 0 refills | Status: DC

## 2020-08-11 NOTE — ED Triage Notes (Signed)
Pt presents with sinus headache and congestion X 2 weeks; pt also complains of vaginal discharge.

## 2020-08-11 NOTE — ED Provider Notes (Signed)
[facility]    CSN: [id number] Arrival date & time: 08/11/20  1213      History   Chief Complaint Chief Complaint  Patient presents with  . Sinus Issues  . Vaginal Discharge   HPI Katrina Beard is a 60 y.o. female.   Senting today with about 2-week history of intermittent runny nose, sinus pain and pressure, ear pressure and popping.  Denies fever, chills, sore throat, cough, chest pain, shortness of breath, body aches, new sick contacts.  So far trying Alka-Seltzer cold and allergy and sometimes taking Claritin-D with temporary relief. notes history of seasonal allergies, takes allergy medication intermittently for this.  She is also complaining of about a week of vaginal discharge now with odor as well.  Denies itching, rashes, vaginal pain, bleeding, new sexual partners or concern for STDs, pelvic or abdominal pain.  Not try anything over-the-counter for this.    Past Medical History:  Diagnosis Date  . Asthma   . CHF (congestive heart failure) ([place])   . Diabetes mellitus   . Gallbladder problem   . HLD (hyperlipidemia)   . Hypertension   . Joint pain   . Knee pain   . 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 Abnormal vaginal bleeding 04/25/2020  . Atherosclerotic heart disease of native coronary artery without angina pectoris 04/25/2020  . Chronic kidney disease, stage 2 (mild) 04/25/2020  . History of gastric bypass 04/25/2020  . Iron deficiency anemia 01/31/2020  . Intestinal malabsorption 01/08/2020  . 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  . Vitamin D deficiency 05/31/2019  . Hypertension associated with type 2 diabetes mellitus ([place]) 05/17/2019  . Diabetes mellitus ([place]) 05/02/2019  . Depression 05/02/2019  . Class 3 severe obesity with serious comorbidity and body mass index (BMI) of 40.0 to 44.9 in adult ([place]) 05/02/2019  . Kidney stone 07/24/2016  . Ureteral stone 07/24/2016  .  Protein-calorie malnutrition ([place]) 08/09/2014  . Hyperlipidemia 12/28/2013  . Type 2 diabetes mellitus without complication ([place]) 36/62/9476    Past Surgical History:  Procedure Laterality Date  . BREAST EXCISIONAL BIOPSY Bilateral   . BREAST SURGERY    . CHOLECYSTECTOMY    . CYSTOSCOPY W/ URETERAL STENT PLACEMENT Right 07/25/2016   Procedure: CYSTOSCOPY WITH RETROGRADE PYELOGRAM/LEFT URETERAL STENT PLACEMENT;  Surgeon: Ardis Hughs, MD;  Location: [facility];  Service: Urology;  Laterality: Right;  . HAND SURGERY    . ROUX-EN-Y GASTRIC BYPASS     07/15/2014    OB History    Gravida  1   Para      Term      Preterm      AB      Living  1     SAB      IAB      Ectopic      Multiple      Live Births               Home Medications    Prior to Admission medications   Medication Sig Start Date End Date Taking? Authorizing Provider  cetirizine (ZYRTEC ALLERGY) 10 MG tablet Take 1 tablet (10 mg total) by mouth daily. 08/11/20  Yes Volney American, PA-C  fluticasone [facility]) 50 MCG/ACT nasal spray Place 1 spray into both nostrils in the morning and at bedtime. 08/11/20  Yes Volney American, PA-C  predniSONE (DELTASONE) 20 MG tablet Take  2 tablets (40 mg total) by mouth daily with breakfast. 08/11/20  Yes Volney American, PA-C  aspirin EC 81 MG tablet Take 81 mg by mouth daily.    [provider]  atorvastatin (LIPITOR) 20 MG tablet Take 20 mg by mouth daily. 10/30/15   [provider]  buPROPion (WELLBUTRIN SR) 150 MG 12 hr tablet Take 1 tablet (150 mg total) by mouth every morning. 07/17/20   [place], Joneen Boers, FNP  ferrous sulfate (FERROUSUL) 325 (65 FE) MG tablet Take 1 tablet (325 mg total) by mouth daily with breakfast. 01/30/20   [place], Joneen Boers, FNP  gabapentin (NEURONTIN) 100 MG capsule Take 100 mg by mouth daily as needed.    [provider]  losartan (COZAAR) 100 MG tablet Take 100 mg by mouth daily.    [provider]  metFORMIN (GLUCOPHAGE) 500 MG tablet Take 1 tablet (500 mg total) by mouth daily with breakfast. 07/17/20   [place]mire, Joneen Boers, FNP  ondansetron (ZOFRAN ODT) 4 MG disintegrating tablet Take 1 tablet (4 mg total) by mouth every 8 (eight) hours as needed for nausea or vomiting. 12/18/19   [place], Joneen Boers, FNP  Semaglutide-Weight Management (WEGOVY) 2.4 MG/0.75ML SOAJ Inject 2.4 mg into the skin once a week. 07/17/20   [place]mire, Joneen Boers, FNP  Vitamin D, Ergocalciferol, (DRISDOL) 1.25 MG (50000 UNIT) CAPS capsule Take 1 capsule (50,000 Units total) by mouth every 7 (seven) days. 06/24/20   [place]mire, Joneen Boers, FNP    Family History Family History  Problem Relation Age of Onset  . Breast cancer Mother 6  . Hypertension Mother   . Hyperlipidemia Mother   . Obesity Mother   . Hypertension Father   . Heart disease Father     Social History Social History   Tobacco Use  . Smoking status: Never Smoker  . Smokeless tobacco: Never Used  Vaping Use  . Vaping Use: Never used  Substance Use Topics  . Alcohol use: No  . Drug use: No     Allergies   Ibuprofen and Lisinopril-hydrochlorothiazide   Review of Systems Review of Systems Per HPI  Physical Exam Triage Vital Signs ED Triage Vitals  Enc Vitals Group     BP 08/11/20 1256 134/86     Pulse Rate 08/11/20 1256 73     Resp 08/11/20 1256 17     Temp 08/11/20 1256 98.2 F (36.8 C)     Temp Source 08/11/20 1256 Oral     SpO2 08/11/20 1256 98 %     Weight --      Height --      Head Circumference --      Peak Flow --      Pain Score 08/11/20 1254 5     Pain Loc --      Pain Edu? --      Excl. in [place]? --    No data found.  Updated Vital Signs BP 134/86 (BP Location: Right Arm)   Pulse 73   Temp 98.2 F (36.8 C) (Oral)   Resp 17   SpO2 98%   Visual Acuity Right Eye Distance:   Left Eye Distance:   Bilateral Distance:    Right Eye Near:   Left Eye Near:    Bilateral Near:     Physical Exam Vitals and  nursing note reviewed.  Constitutional:      Appearance: Normal appearance. She is not ill-appearing.  HENT:     Head: Atraumatic.  Ears:     Comments: Mild bilateral middle ear effusions    Nose: Rhinorrhea present.     Comments: Bilateral nasal mucosa erythematous and edematous    Mouth/Throat:     Mouth: Mucous membranes are moist.     Pharynx: Oropharynx is clear. No oropharyngeal exudate.  Eyes:     Extraocular Movements: Extraocular movements intact.     Conjunctiva/sclera: Conjunctivae normal.  Cardiovascular:     Rate and Rhythm: Normal rate and regular rhythm.     Heart sounds: Normal heart sounds.  Pulmonary:     Effort: Pulmonary effort is normal. No respiratory distress.     Breath sounds: Normal breath sounds. No wheezing.  Abdominal:     General: Bowel sounds are normal. There is no distension.     Palpations: Abdomen is soft.     Tenderness: There is no abdominal tenderness. There is no guarding.  Musculoskeletal:        General: Normal range of motion.     Cervical back: Normal range of motion and neck supple.  Skin:    General: Skin is warm and dry.  Neurological:     Mental Status: She is alert and oriented to person, place, and time.  Psychiatric:        Mood and Affect: Mood normal.        Thought Content: Thought content normal.        Judgment: Judgment normal.    UC Treatments / Results  Labs (all labs ordered are listed, but only abnormal results are displayed) Labs Reviewed  CERVICOVAGINAL ANCILLARY ONLY    EKG  Radiology No results found.  Procedures Procedures (including critical care time)  Medications Ordered in UC Medications - No data to display  Initial Impression / Assessment and Plan / UC Course  I have reviewed the triage vital signs and the nursing notes.  Pertinent labs & imaging results that were available during my care of the patient were reviewed by me and considered in my medical decision making (see chart for  details).     Suspect seasonal allergy exacerbation causing symptoms, no evidence of bacterial infection at this time.  Will start daily Zyrtec, twice daily Flonase and give prednisone burst to help with current flare.  Vaginal swab pending, discussed good supportive home care and boric acid suppositories while awaiting results.  Treat based on results.  Final Clinical Impressions(s) / UC Diagnoses   Final diagnoses:  Seasonal allergic rhinitis due to other allergic trigger  Sinus pressure  Vaginal discharge   Discharge Instructions   None    ED Prescriptions    Medication Sig Dispense Auth. Provider   cetirizine (ZYRTEC ALLERGY) 10 MG tablet Take 1 tablet (10 mg total) by mouth daily. 30 tablet Volney American, PA-C   fluticasone [facility]) 50 MCG/ACT nasal spray Place 1 spray into both nostrils in the morning and at bedtime. 16 g Volney American, [place]   predniSONE (DELTASONE) 20 MG tablet Take 2 tablets (40 mg total) by mouth daily with breakfast. 10 tablet Volney American, [place]     PDMP not reviewed this encounter.   Volney American, [place] 08/11/20 1357

## 2020-08-12 ENCOUNTER — Ambulatory Visit (INDEPENDENT_AMBULATORY_CARE_PROVIDER_SITE_OTHER): Payer: BC Managed Care – PPO | Admitting: Family Medicine

## 2020-08-12 ENCOUNTER — Encounter (INDEPENDENT_AMBULATORY_CARE_PROVIDER_SITE_OTHER): Payer: Self-pay | Admitting: Family Medicine

## 2020-08-12 VITALS — BP 154/87 | HR 88 | Temp 98.2°F | Ht 61.0 in | Wt 188.0 lb

## 2020-08-12 DIAGNOSIS — E1159 Type 2 diabetes mellitus with other circulatory complications: Secondary | ICD-10-CM | POA: Diagnosis not present

## 2020-08-12 DIAGNOSIS — F3289 Other specified depressive episodes: Secondary | ICD-10-CM

## 2020-08-12 DIAGNOSIS — I152 Hypertension secondary to endocrine disorders: Secondary | ICD-10-CM

## 2020-08-12 DIAGNOSIS — I10 Essential (primary) hypertension: Secondary | ICD-10-CM

## 2020-08-12 DIAGNOSIS — E1169 Type 2 diabetes mellitus with other specified complication: Secondary | ICD-10-CM | POA: Diagnosis not present

## 2020-08-12 DIAGNOSIS — E559 Vitamin D deficiency, unspecified: Secondary | ICD-10-CM | POA: Diagnosis not present

## 2020-08-12 DIAGNOSIS — Z6841 Body Mass Index (BMI) 40.0 and over, adult: Secondary | ICD-10-CM

## 2020-08-12 LAB — CERVICOVAGINAL ANCILLARY ONLY
Bacterial Vaginitis (gardnerella): POSITIVE — AB
Candida Glabrata: NEGATIVE
Candida Vaginitis: NEGATIVE
Chlamydia: NEGATIVE
Comment: NEGATIVE
Comment: NEGATIVE
Comment: NEGATIVE
Comment: NEGATIVE
Comment: NEGATIVE
Comment: NORMAL
Neisseria Gonorrhea: NEGATIVE
Trichomonas: NEGATIVE

## 2020-08-12 MED ORDER — WEGOVY 2.4 MG/0.75ML ~~LOC~~ SOAJ
2.4000 mg | SUBCUTANEOUS | 0 refills | Status: DC
Start: 2020-08-12 — End: 2020-09-09

## 2020-08-12 MED ORDER — BUPROPION HCL ER (SR) 150 MG PO TB12
150.0000 mg | ORAL_TABLET | ORAL | 0 refills | Status: DC
Start: 2020-08-12 — End: 2020-09-09

## 2020-08-12 MED ORDER — VITAMIN D (ERGOCALCIFEROL) 1.25 MG (50000 UNIT) PO CAPS: 50000.0000 [IU] | ORAL_CAPSULE | ORAL | 0 refills | Status: DC

## 2020-08-13 ENCOUNTER — Telehealth (HOSPITAL_COMMUNITY): Payer: Self-pay | Admitting: Emergency Medicine

## 2020-08-13 MED ORDER — METRONIDAZOLE 500 MG PO TABS: 500.0000 mg | ORAL_TABLET | Freq: Two times a day (BID) | ORAL | 0 refills | Status: DC

## 2020-08-18 NOTE — Progress Notes (Signed)
Chief Complaint:   OBESITY Katrina Beard is here to discuss her progress with her obesity treatment plan along with follow-up of her obesity related diagnoses. Katrina Beard is on practicing portion control and making smarter food choices, such as increasing vegetables and decreasing simple carbohydrates and states she is following her eating plan approximately 80% of the time. Katrina Beard states she is walking for 30 minutes 2 times per week.  Today's visit was #: 25 Starting weight: 231 lbs Starting date: 04/03/2019 Today's weight: 188 lbs Today's date: 08/12/2020 Total lbs lost to date: 43 Total lbs lost since last in-office visit: 4  Interim History: We increased [name]'s dose of Wegovy  to 2.4 mg weekly at last OV. She notes good appetite suppression. She is having protein at all meals.  Subjective:   1. Type 2 diabetes mellitus with other specified complication, without long-term current use of insulin (HCC) [name]'s diabetes mellitus is well controlled. Last A1c was 6.0. She is on Wegovy 2.4 mg and metformin.  Lab Results  Component Value Date   HGBA1C 6.0 (H) 06/24/2020   HGBA1C 5.9 (H) 01/08/2020   HGBA1C 6.3 (H) 08/06/2019   Lab Results  Component Value Date   LDLCALC 75 06/24/2020   CREATININE 1.08 (H) 06/24/2020   Lab Results  Component Value Date   INSULIN 11.2 04/03/2019   2. Vitamin D deficiency Katrina Beard's Vit D level is slightly low at 44.4. She is on weekly prescription Vit D.  3. Essential hypertension Katrina Beard's blood pressure is elevated today, but is usually well controlled on prednisone for sinus issues. She is compliant with losartan.   BP Readings from Last 3 Encounters:  08/12/20 (!) 154/87  08/11/20 134/86  07/17/20 (!) 153/77   Lab Results  Component Value Date   CREATININE 1.08 (H) 06/24/2020   CREATININE 0.95 08/06/2019   CREATININE 0.79 04/03/2019   4. Other depression, with emotional eating Katrina Beard's cravings are well controlled, and her mood is  stable.  Assessment/Plan:   1. Type 2 diabetes mellitus with other specified complication, without long-term current use of insulin (HCC) We will refill Wegovy for 1 month. Continue metformin. - Semaglutide-Weight Management (WEGOVY) 2.4 MG/0.75ML SOAJ; Inject 2.4 mg into the skin once a week.  Dispense: 3 mL; Refill: 0  2. Vitamin D deficiency  We will refill prescription Vitamin D for 1 month. [name] will follow-up for routine testing of Vitamin D, at least 2-3 times per year to avoid over-replacement.  - Vitamin D, Ergocalciferol, (DRISDOL) 1.25 MG (50000 UNIT) CAPS capsule; Take 1 capsule (50,000 Units total) by mouth every 7 (seven) days.  Dispense: 4 capsule; Refill: 0  3. Essential hypertension Carnisha will continue losartan.  4. Other depression, with emotional eating  We will refill bupropion for 1 month. Orders and follow up as documented in patient record.   - buPROPion (WELLBUTRIN SR) 150 MG 12 hr tablet; Take 1 tablet (150 mg total) by mouth every morning.  Dispense: 30 tablet; Refill: 0  5. Obesity: Current BMI 35.54 Katrina Beard is currently in the action stage of change. As such, her goal is to continue with weight loss efforts. She has agreed to practicing portion control and making smarter food choices, such as increasing vegetables and decreasing simple carbohydrates.   Exercise goals: As is, and add resistance 2 times per week.  Behavioral modification strategies: decreasing simple carbohydrates.  Katrina Beard has agreed to follow-up with our clinic in 4 weeks.   Objective:   Blood pressure (!) 154/87, pulse  88, temperature 98.2 F (36.8 C), height 5\' 1"  (1.549 m), weight 188 lb (85.3 kg), SpO2 99 %. Body mass index is 35.52 kg/m.  General: Cooperative, alert, well developed, in no acute distress. HEENT: Conjunctivae and lids unremarkable. Cardiovascular: Regular rhythm.  Lungs: Normal work of breathing. Neurologic: No focal deficits.   Lab Results  Component Value Date    CREATININE 1.08 (H) 06/24/2020   BUN 15 06/24/2020   NA 141 06/24/2020   K 3.9 06/24/2020   CL 103 06/24/2020   CO2 22 06/24/2020   Lab Results  Component Value Date   ALT 22 06/24/2020   AST 18 06/24/2020   ALKPHOS 95 06/24/2020   BILITOT 0.4 06/24/2020   Lab Results  Component Value Date   HGBA1C 6.0 (H) 06/24/2020   HGBA1C 5.9 (H) 01/08/2020   HGBA1C 6.3 (H) 08/06/2019   HGBA1C 7.2 (H) 04/03/2019   Lab Results  Component Value Date   INSULIN 11.2 04/03/2019   Lab Results  Component Value Date   TSH 0.575 04/03/2019   Lab Results  Component Value Date   CHOL 133 06/24/2020   HDL 43 06/24/2020   LDLCALC 75 06/24/2020   TRIG 75 06/24/2020   Lab Results  Component Value Date   WBC 7.2 06/24/2020   HGB 11.7 06/24/2020   HCT 35.1 06/24/2020   MCV 86 06/24/2020   PLT 357 06/24/2020   Lab Results  Component Value Date   IRON 81 06/24/2020   TIBC 252 06/24/2020   FERRITIN 64 06/24/2020   Attestation Statements:   Reviewed by clinician on day of visit: allergies, medications, problem list, medical history, surgical history, family history, social history, and previous encounter notes.   Wilhemena Durie, am acting as Location manager for [employer], FNP-C.  I have reviewed the above documentation for accuracy and completeness, and I agree with the above. -  Georgianne Fick, FNP

## 2020-08-19 ENCOUNTER — Encounter (INDEPENDENT_AMBULATORY_CARE_PROVIDER_SITE_OTHER): Payer: Self-pay | Admitting: Family Medicine

## 2020-09-09 ENCOUNTER — Ambulatory Visit (INDEPENDENT_AMBULATORY_CARE_PROVIDER_SITE_OTHER): Payer: BC Managed Care – PPO | Admitting: Family Medicine

## 2020-09-09 ENCOUNTER — Other Ambulatory Visit: Payer: Self-pay

## 2020-09-09 ENCOUNTER — Encounter (INDEPENDENT_AMBULATORY_CARE_PROVIDER_SITE_OTHER): Payer: Self-pay | Admitting: Family Medicine

## 2020-09-09 VITALS — BP 136/81 | HR 72 | Temp 97.9°F | Ht 61.0 in | Wt 193.0 lb

## 2020-09-09 DIAGNOSIS — E559 Vitamin D deficiency, unspecified: Secondary | ICD-10-CM | POA: Diagnosis not present

## 2020-09-09 DIAGNOSIS — F3289 Other specified depressive episodes: Secondary | ICD-10-CM | POA: Diagnosis not present

## 2020-09-09 DIAGNOSIS — Z6841 Body Mass Index (BMI) 40.0 and over, adult: Secondary | ICD-10-CM

## 2020-09-09 DIAGNOSIS — E1169 Type 2 diabetes mellitus with other specified complication: Secondary | ICD-10-CM

## 2020-09-09 MED ORDER — BUPROPION HCL ER (SR) 150 MG PO TB12: 150.0000 mg | ORAL_TABLET | ORAL | 0 refills | Status: DC

## 2020-09-09 MED ORDER — WEGOVY 2.4 MG/0.75ML ~~LOC~~ SOAJ: 2.4000 mg | SUBCUTANEOUS | 0 refills | Status: DC

## 2020-09-09 MED ORDER — VITAMIN D (ERGOCALCIFEROL) 1.25 MG (50000 UNIT) PO CAPS: 50000.0000 [IU] | ORAL_CAPSULE | ORAL | 0 refills | Status: DC

## 2020-09-09 MED ORDER — METFORMIN HCL 500 MG PO TABS: 500.0000 mg | ORAL_TABLET | Freq: Every day | ORAL | 0 refills | Status: DC

## 2020-09-11 ENCOUNTER — Encounter (INDEPENDENT_AMBULATORY_CARE_PROVIDER_SITE_OTHER): Payer: Self-pay | Admitting: Family Medicine

## 2020-09-11 NOTE — Progress Notes (Signed)
Chief Complaint:   OBESITY Katrina Beard is here to discuss her progress with her obesity treatment plan along with follow-up of her obesity related diagnoses. Katrina Beard is on practicing portion control and making smarter food choices, such as increasing vegetables and decreasing simple carbohydrates and states she is following her eating plan approximately 50% of the time. Katrina Beard states she is doing gym exercises 20 minutes 2-3 times per week.  Today's visit was #: 97 Starting weight: 231 lbs Starting date: 04/03/2019 Today's weight: 193 lbs Today's date: 09/09/2020 Total lbs lost to date: 38 Total lbs lost since last in-office visit: +5  Interim History: Katrina Beard notes she has been drinking too many [employer] iced coffees. She has also been eating out more. Her protein intake is good.  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   Subjective:   1. Vitamin D deficiency She is currently taking prescription vitamin D 50,000 IU each week. Her Vit D is low at 44.4.  Lab Results  Component Value Date   VD25OH 44.4 06/24/2020   VD25OH 55.2 01/08/2020   VD25OH 43.6 08/06/2019   2. Other depression, with emotional eating Katrina Beard's mood is stable. She is craving iced coffee from [employer].  3. Type 2 diabetes mellitus with other specified complication, without long-term current use of insulin (HCC) Well controlled. Katrina Beard is on Wegovy and Metformin. Her last A1c was 6.0.  Lab Results  Component Value Date   HGBA1C 6.0 (H) 06/24/2020   HGBA1C 5.9 (H) 01/08/2020   HGBA1C 6.3 (H) 08/06/2019   Lab Results  Component Value Date   LDLCALC 75 06/24/2020   CREATININE 1.08 (H) 06/24/2020   Lab Results  Component Value Date   INSULIN 11.2 04/03/2019   Assessment/Plan:   1. Vitamin D deficiency We will refill Vit D for 1 month supply.  - Vitamin D, Ergocalciferol, (DRISDOL) 1.25 MG (50000 UNIT) CAPS capsule; Take 1 capsule (50,000 Units  total) by mouth every 7 (seven) days.  Dispense: 4 capsule; Refill: 0  2. Other depression, with emotional eating We will refill Wellbutrin for 1 month supply.  - buPROPion (WELLBUTRIN SR) 150 MG 12 hr tablet; Take 1 tablet (150 mg total) by mouth every morning.  Dispense: 30 tablet; Refill: 0  3. Type 2 diabetes mellitus with other specified complication, without long-term current use of insulin (HCC) Continue Wegovy and Metformin. Decrease sugar sweetened beverages. Refill Metformin and Wegovy.  - metFORMIN (GLUCOPHAGE) 500 MG tablet; Take 1 tablet (500 mg total) by mouth daily with breakfast.  Dispense: 30 tablet; Refill: 0 - Semaglutide-Weight Management (WEGOVY) 2.4 MG/0.75ML SOAJ; Inject 2.4 mg into the skin once a week.  Dispense: 3 mL; Refill: 0  4. Obesity: Current BMI 36.49  Katrina Beard is currently in the action stage of change. As such, her goal is to continue with weight loss efforts. She has agreed to practicing portion control and making smarter food choices, such as increasing vegetables and decreasing simple carbohydrates.   Exercise goals:  As is  Behavioral modification strategies: increasing lean protein intake and decreasing liquid calories.  Katrina Beard has agreed to follow-up with our clinic in 3 weeks.  Objective:   Blood pressure 136/81, pulse 72, temperature 97.9 F (36.6 C), height 5\' 1"  (1.549 m), weight 193 lb (87.5 kg), SpO2 100 %. Body mass index is 36.47 kg/m.  General: Cooperative, alert, well developed, in no acute distress. HEENT: Conjunctivae and lids unremarkable. Cardiovascular:  Regular rhythm.  Lungs: Normal work of breathing. Neurologic: No focal deficits.   Lab Results  Component Value Date   CREATININE 1.08 (H) 06/24/2020   BUN 15 06/24/2020   NA 141 06/24/2020   K 3.9 06/24/2020   CL 103 06/24/2020   CO2 22 06/24/2020   Lab Results  Component Value Date   ALT 22 06/24/2020   AST 18 06/24/2020   ALKPHOS 95 06/24/2020   BILITOT 0.4  06/24/2020   Lab Results  Component Value Date   HGBA1C 6.0 (H) 06/24/2020   HGBA1C 5.9 (H) 01/08/2020   HGBA1C 6.3 (H) 08/06/2019   HGBA1C 7.2 (H) 04/03/2019   Lab Results  Component Value Date   INSULIN 11.2 04/03/2019   Lab Results  Component Value Date   TSH 0.575 04/03/2019   Lab Results  Component Value Date   CHOL 133 06/24/2020   HDL 43 06/24/2020   LDLCALC 75 06/24/2020   TRIG 75 06/24/2020   Lab Results  Component Value Date   VD25OH 44.4 06/24/2020   VD25OH 55.2 01/08/2020   VD25OH 43.6 08/06/2019   Lab Results  Component Value Date   WBC 7.2 06/24/2020   HGB 11.7 06/24/2020   HCT 35.1 06/24/2020   MCV 86 06/24/2020   PLT 357 06/24/2020   Lab Results  Component Value Date   IRON 81 06/24/2020   TIBC 252 06/24/2020   FERRITIN 64 06/24/2020    Attestation Statements:   Reviewed by clinician on day of visit: allergies, medications, problem list, medical history, surgical history, family history, social history, and previous encounter notes.  Coral Ceo, CMA, am acting as Location manager for [employer], [place].  I have reviewed the above documentation for accuracy and completeness, and I agree with the above. -  Georgianne Fick, FNP

## 2020-09-30 ENCOUNTER — Encounter (INDEPENDENT_AMBULATORY_CARE_PROVIDER_SITE_OTHER): Payer: Self-pay | Admitting: Family Medicine

## 2020-09-30 ENCOUNTER — Ambulatory Visit (INDEPENDENT_AMBULATORY_CARE_PROVIDER_SITE_OTHER): Payer: BC Managed Care – PPO | Admitting: Family Medicine

## 2020-09-30 ENCOUNTER — Other Ambulatory Visit: Payer: Self-pay

## 2020-09-30 VITALS — BP 123/78 | HR 77 | Temp 97.9°F | Ht 61.0 in | Wt 190.0 lb

## 2020-09-30 DIAGNOSIS — E559 Vitamin D deficiency, unspecified: Secondary | ICD-10-CM | POA: Diagnosis not present

## 2020-09-30 DIAGNOSIS — E1169 Type 2 diabetes mellitus with other specified complication: Secondary | ICD-10-CM | POA: Diagnosis not present

## 2020-09-30 DIAGNOSIS — Z9189 Other specified personal risk factors, not elsewhere classified: Secondary | ICD-10-CM

## 2020-09-30 DIAGNOSIS — F3289 Other specified depressive episodes: Secondary | ICD-10-CM | POA: Diagnosis not present

## 2020-09-30 DIAGNOSIS — Z6841 Body Mass Index (BMI) 40.0 and over, adult: Secondary | ICD-10-CM

## 2020-09-30 MED ORDER — VITAMIN D (ERGOCALCIFEROL) 1.25 MG (50000 UNIT) PO CAPS: 50000.0000 [IU] | ORAL_CAPSULE | ORAL | 0 refills | Status: DC

## 2020-09-30 MED ORDER — WEGOVY 2.4 MG/0.75ML ~~LOC~~ SOAJ: 2.4000 mg | SUBCUTANEOUS | 0 refills | Status: DC

## 2020-09-30 MED ORDER — METFORMIN HCL 500 MG PO TABS
500.0000 mg | ORAL_TABLET | Freq: Every day | ORAL | 0 refills | Status: DC
Start: 2020-09-30 — End: 2020-10-20

## 2020-09-30 MED ORDER — BLOOD GLUCOSE MONITOR KIT
PACK | 0 refills | Status: DC
Start: 2020-09-30 — End: 2021-11-26

## 2020-09-30 MED ORDER — BUPROPION HCL ER (SR) 150 MG PO TB12
150.0000 mg | ORAL_TABLET | ORAL | 0 refills | Status: DC
Start: 2020-09-30 — End: 2020-10-20

## 2020-10-07 NOTE — Progress Notes (Signed)
Chief Complaint:   OBESITY Katrina Beard is here to discuss her progress with her obesity treatment plan along with follow-up of her obesity related diagnoses. Katrina Beard is on practicing portion control and making smarter food choices, such as increasing vegetables and decreasing simple carbohydrates and states she is following her eating plan approximately 60% of the time. Katrina Beard states she is walking at work all day 5 times per week.  Today's visit was #: 49 Starting weight: 231 lbs Starting date: 04/03/2019 Today's weight: 190 lbs Today's date: 09/30/2020 Total lbs lost to date: 41 lbs Total lbs lost since last in-office visit: 3 lbs  Interim History: Katrina Beard has cut out [employer]. She is eating out less. She is traveling to [place] tomorrow with her church.  Subjective:   1. Type 2 diabetes mellitus with other specified complication, without long-term current use of insulin ([facility]) Katrina Beard diabetes is well controlled. Her last A1C level was 6.0 (April 2022). She denies hypoglycemia and constipation. She is on Wegovy 2.4 mg weekly.  Lab Results  Component Value Date   HGBA1C 6.0 (H) 06/24/2020   HGBA1C 5.9 (H) 01/08/2020   HGBA1C 6.3 (H) 08/06/2019   Lab Results  Component Value Date   LDLCALC 75 06/24/2020   CREATININE 1.08 (H) 06/24/2020   Lab Results  Component Value Date   INSULIN 11.2 04/03/2019    2. Other depression, with emotional eating Katrina Beard's mood is stable. Cravings are well controlled. Her BP is within normal limits.  3. Vitamin D deficiency Katrina Beard is taking Vitamin D. Her Vitamin D is slightly low at 44.4 on weekly.   Lab Results  Component Value Date   VD25OH 44.4 06/24/2020   VD25OH 55.2 01/08/2020   VD25OH 43.6 08/06/2019    4. At risk for hypoglycemia Katrina Beard is at increased risk for hypoglycemia due to diabetes, and on Wegovy 2.4.     Assessment/Plan:   1. Type 2 diabetes mellitus with other specified complication, without long-term current use  of insulin (HCC) Katrina Beard agrees to start using a Glucometer and supplies. We will refill Wegovy 2.4 mg weekly. We will refill Metformin 500 mg by mouth daily for 1 month with no refills. S - metFORMIN (GLUCOPHAGE) 500 MG tablet; Take 1 tablet (500 mg total) by mouth daily with breakfast.  Dispense: 30 tablet; Refill: 0 - Semaglutide-Weight Management (WEGOVY) 2.4 MG/0.75ML SOAJ; Inject 2.4 mg into the skin once a week.  Dispense: 3 mL; Refill: 0 - blood glucose meter kit and supplies KIT; Dispense based on patient and insurance preference. Use up to four times daily as directed.  Dispense: 1 each; Refill: 0  Katrina Beard will begin using Miralax for constipation.  2. Other depression, with emotional eating Behavior modification techniques were discussed today to help Katrina Beard deal with her emotional/non-hunger eating behaviors. Orders and follow up as documented in patient record.    - buPROPion (WELLBUTRIN SR) 150 MG 12 hr tablet; Take 1 tablet (150 mg total) by mouth every morning.  Dispense: 30 tablet; Refill: 0  3. Vitamin D deficiency She agrees to continue to take prescription Vitamin D 50,000 IU every week and we will refill Vitamin D for 1 month weekly with no refills and she will follow-up for routine testing of Vitamin D, at least 2-3 times per year to avoid over-replacement.  - Vitamin D, Ergocalciferol, (DRISDOL) 1.25 MG (50000 UNIT) CAPS capsule; Take 1 capsule (50,000 Units total) by mouth every 7 (seven) days.  Dispense: 4 capsule; Refill: 0  4. At risk for hypoglycemia Katrina Beard was given approximately 15 minutes of counseling today regarding prevention of hypoglycemia. She was advised of symptoms of hypoglycemia. Katrina Beard was instructed to avoid skipping meals, eat regular protein rich meals and schedule low calorie snacks as needed.   Repetitive spaced learning was employed today to elicit superior memory formation and behavioral change   5. Obesity: Current BMI 35.92 Katrina Beard is currently in the  action stage of change. As such, her goal is to continue with weight loss efforts. She has agreed to practicing portion control and making smarter food choices, such as increasing vegetables and decreasing simple carbohydrates.   Exercise goals:  As is.  Behavioral modification strategies: increasing water intake, decreasing eating out, and travel eating strategies.  Katrina Beard has agreed to follow-up with our clinic in 3 weeks.  Objective:   Blood pressure 123/78, pulse 77, temperature 97.9 F (36.6 C), height 5' 1"  (1.549 m), weight 190 lb (86.2 kg), SpO2 99 %. Body mass index is 35.9 kg/m.  General: Cooperative, alert, well developed, in no acute distress. HEENT: Conjunctivae and lids unremarkable. Cardiovascular: Regular rhythm.  Lungs: Normal work of breathing. Neurologic: No focal deficits.   Lab Results  Component Value Date   CREATININE 1.08 (H) 06/24/2020   BUN 15 06/24/2020   NA 141 06/24/2020   K 3.9 06/24/2020   CL 103 06/24/2020   CO2 22 06/24/2020   Lab Results  Component Value Date   ALT 22 06/24/2020   AST 18 06/24/2020   ALKPHOS 95 06/24/2020   BILITOT 0.4 06/24/2020   Lab Results  Component Value Date   HGBA1C 6.0 (H) 06/24/2020   HGBA1C 5.9 (H) 01/08/2020   HGBA1C 6.3 (H) 08/06/2019   HGBA1C 7.2 (H) 04/03/2019   Lab Results  Component Value Date   INSULIN 11.2 04/03/2019   Lab Results  Component Value Date   TSH 0.575 04/03/2019   Lab Results  Component Value Date   CHOL 133 06/24/2020   HDL 43 06/24/2020   LDLCALC 75 06/24/2020   TRIG 75 06/24/2020   Lab Results  Component Value Date   VD25OH 44.4 06/24/2020   VD25OH 55.2 01/08/2020   VD25OH 43.6 08/06/2019   Lab Results  Component Value Date   WBC 7.2 06/24/2020   HGB 11.7 06/24/2020   HCT 35.1 06/24/2020   MCV 86 06/24/2020   PLT 357 06/24/2020   Lab Results  Component Value Date   IRON 81 06/24/2020   TIBC 252 06/24/2020   FERRITIN 64 06/24/2020   Attestation Statements:    Reviewed by clinician on day of visit: allergies, medications, problem list, medical history, surgical history, family history, social history, and previous encounter notes.  I, Lizbeth Bark, RMA, am acting as Location manager for [employer], [place].  I have reviewed the above documentation for accuracy and completeness, and I agree with the above. -  Georgianne Fick, FNP

## 2020-10-20 ENCOUNTER — Other Ambulatory Visit: Payer: Self-pay

## 2020-10-20 ENCOUNTER — Ambulatory Visit (INDEPENDENT_AMBULATORY_CARE_PROVIDER_SITE_OTHER): Payer: BC Managed Care – PPO | Admitting: Family Medicine

## 2020-10-20 ENCOUNTER — Encounter (INDEPENDENT_AMBULATORY_CARE_PROVIDER_SITE_OTHER): Payer: Self-pay | Admitting: Family Medicine

## 2020-10-20 VITALS — BP 133/81 | HR 70 | Temp 97.1°F | Ht 61.0 in | Wt 189.0 lb

## 2020-10-20 DIAGNOSIS — Z9189 Other specified personal risk factors, not elsewhere classified: Secondary | ICD-10-CM | POA: Diagnosis not present

## 2020-10-20 DIAGNOSIS — E1169 Type 2 diabetes mellitus with other specified complication: Secondary | ICD-10-CM

## 2020-10-20 DIAGNOSIS — F3289 Other specified depressive episodes: Secondary | ICD-10-CM | POA: Diagnosis not present

## 2020-10-20 DIAGNOSIS — Z6841 Body Mass Index (BMI) 40.0 and over, adult: Secondary | ICD-10-CM | POA: Diagnosis not present

## 2020-10-20 MED ORDER — METFORMIN HCL 500 MG PO TABS: 500.0000 mg | ORAL_TABLET | Freq: Every day | ORAL | 0 refills | Status: DC

## 2020-10-20 MED ORDER — WEGOVY 2.4 MG/0.75ML ~~LOC~~ SOAJ: 2.4000 mg | SUBCUTANEOUS | 0 refills | Status: DC

## 2020-10-20 MED ORDER — BUPROPION HCL ER (SR) 150 MG PO TB12: 150.0000 mg | ORAL_TABLET | ORAL | 0 refills | Status: DC

## 2020-10-20 NOTE — Progress Notes (Signed)
Chief Complaint:   OBESITY Katrina Beard is here to discuss her progress with her obesity treatment plan along with follow-up of her obesity related diagnoses. Katrina Beard is on practicing portion control and making smarter food choices, such as increasing vegetables and decreasing simple carbohydrates and states she is following her eating plan approximately 60% of the time. Katrina Beard states she is doing Faith workout 15-20 minutes 3 times per week.  Today's visit was #: 28 Starting weight: 231 lbs Starting date: 04/03/2019 Today's weight: 189 lbs Today's date: 10/20/2020 Total lbs lost to date: 42 Total lbs lost since last in-office visit: 1  Interim History: Katrina Beard took a recent trip to [place] and notes she was off plan. She is having 3 meals a day. She does have lunch (a salad) without protein at times.  Subjective:   1. Type 2 diabetes mellitus with other specified complication, without long-term current use of insulin (HCC) Well controlled. Katrina Beard is on Wegovy and Metformin. Her last A1c was 6.0. She denies hypoglycemia.  Lab Results  Component Value Date   HGBA1C 6.0 (H) 06/24/2020   HGBA1C 5.9 (H) 01/08/2020   HGBA1C 6.3 (H) 08/06/2019   Lab Results  Component Value Date   LDLCALC 75 06/24/2020   CREATININE 1.08 (H) 06/24/2020   Lab Results  Component Value Date   INSULIN 11.2 04/03/2019   2. Other depression, with emotional eating Mood stable. Tericka's cravings are well controlled with bupropion and Wegovy.  3. At risk for impaired metabolic function Any is at increased risk for impaired metabolic function due to inadequate protein intake.  Assessment/Plan:   1. Type 2 diabetes mellitus with other specified complication, without long-term current use of insulin (HCC)  Refill- metFORMIN (GLUCOPHAGE) 500 MG tablet; Take 1 tablet (500 mg total) by mouth daily with breakfast.  Dispense: 30 tablet; Refill: 0  Refill- Semaglutide-Weight Management (WEGOVY) 2.4 MG/0.75ML SOAJ;  Inject 2.4 mg into the skin once a week.  Dispense: 3 mL; Refill: 0  2. Other depression, with emotional eating Behavior modification techniques were discussed today to help Katrina Beard deal with her emotional/non-hunger eating behaviors.  Orders and follow up as documented in patient record.   Refill- buPROPion (WELLBUTRIN SR) 150 MG 12 hr tablet; Take 1 tablet (150 mg total) by mouth every morning.  Dispense: 30 tablet; Refill: 0  3. At risk for impaired metabolic function Katrina Beard was given approximately 15 minutes of impaired  metabolic function prevention counseling today. We discussed intensive lifestyle modifications today with an emphasis on specific nutrition and exercise instructions and strategies.   Repetitive spaced learning was employed today to elicit superior memory formation and behavioral change.  4. Obesity: Current BMI 35.73  Katrina Beard is currently in the action stage of change. As such, her goal is to continue with weight loss efforts. She has agreed to practicing portion control and making smarter food choices, such as increasing vegetables and decreasing simple carbohydrates.   Discussed ways to add protein at lunch.  Exercise goals:  As is  Behavioral modification strategies: increasing lean protein intake and meal planning and cooking strategies.  Katrina Beard has agreed to follow-up with our clinic in 3 weeks, fasting.  Objective:   Blood pressure 133/81, pulse 70, temperature (!) 97.1 F (36.2 C), height '5\' 1"'$  (1.549 m), weight 189 lb (85.7 kg), SpO2 99 %. Body mass index is 35.71 kg/m.  General: Cooperative, alert, well developed, in no acute distress. HEENT: Conjunctivae and lids unremarkable. Cardiovascular: Regular rhythm.  Lungs: Normal  work of breathing. Neurologic: No focal deficits.   Lab Results  Component Value Date   CREATININE 1.08 (H) 06/24/2020   BUN 15 06/24/2020   NA 141 06/24/2020   K 3.9 06/24/2020   CL 103 06/24/2020   CO2 22 06/24/2020   Lab  Results  Component Value Date   ALT 22 06/24/2020   AST 18 06/24/2020   ALKPHOS 95 06/24/2020   BILITOT 0.4 06/24/2020   Lab Results  Component Value Date   HGBA1C 6.0 (H) 06/24/2020   HGBA1C 5.9 (H) 01/08/2020   HGBA1C 6.3 (H) 08/06/2019   HGBA1C 7.2 (H) 04/03/2019   Lab Results  Component Value Date   INSULIN 11.2 04/03/2019   Lab Results  Component Value Date   TSH 0.575 04/03/2019   Lab Results  Component Value Date   CHOL 133 06/24/2020   HDL 43 06/24/2020   LDLCALC 75 06/24/2020   TRIG 75 06/24/2020   Lab Results  Component Value Date   VD25OH 44.4 06/24/2020   VD25OH 55.2 01/08/2020   VD25OH 43.6 08/06/2019   Lab Results  Component Value Date   WBC 7.2 06/24/2020   HGB 11.7 06/24/2020   HCT 35.1 06/24/2020   MCV 86 06/24/2020   PLT 357 06/24/2020   Lab Results  Component Value Date   IRON 81 06/24/2020   TIBC 252 06/24/2020   FERRITIN 64 06/24/2020    Attestation Statements:   Reviewed by clinician on day of visit: allergies, medications, problem list, medical history, surgical history, family history, social history, and previous encounter notes.  Coral Ceo, CMA, am acting as Location manager for [employer], [place].  I have reviewed the above documentation for accuracy and completeness, and I agree with the above. -  Georgianne Fick, FNP

## 2020-11-13 ENCOUNTER — Encounter (INDEPENDENT_AMBULATORY_CARE_PROVIDER_SITE_OTHER): Payer: Self-pay | Admitting: Family Medicine

## 2020-11-13 ENCOUNTER — Ambulatory Visit (INDEPENDENT_AMBULATORY_CARE_PROVIDER_SITE_OTHER): Payer: BC Managed Care – PPO | Admitting: Family Medicine

## 2020-11-13 ENCOUNTER — Other Ambulatory Visit: Payer: Self-pay

## 2020-11-13 VITALS — BP 148/84 | HR 71 | Temp 97.9°F | Ht 61.0 in | Wt 187.0 lb

## 2020-11-13 DIAGNOSIS — E559 Vitamin D deficiency, unspecified: Secondary | ICD-10-CM

## 2020-11-13 DIAGNOSIS — Z9189 Other specified personal risk factors, not elsewhere classified: Secondary | ICD-10-CM | POA: Diagnosis not present

## 2020-11-13 DIAGNOSIS — Z6841 Body Mass Index (BMI) 40.0 and over, adult: Secondary | ICD-10-CM

## 2020-11-13 DIAGNOSIS — F3289 Other specified depressive episodes: Secondary | ICD-10-CM

## 2020-11-13 DIAGNOSIS — E1169 Type 2 diabetes mellitus with other specified complication: Secondary | ICD-10-CM

## 2020-11-13 MED ORDER — WEGOVY 2.4 MG/0.75ML ~~LOC~~ SOAJ
2.4000 mg | SUBCUTANEOUS | 0 refills | Status: DC
Start: 2020-11-13 — End: 2020-12-04

## 2020-11-13 MED ORDER — BUPROPION HCL ER (SR) 150 MG PO TB12
150.0000 mg | ORAL_TABLET | ORAL | 0 refills | Status: DC
Start: 2020-11-13 — End: 2020-12-04

## 2020-11-13 MED ORDER — METFORMIN HCL 500 MG PO TABS: 500.0000 mg | ORAL_TABLET | Freq: Every day | ORAL | 0 refills | Status: DC

## 2020-11-13 NOTE — Progress Notes (Signed)
Chief Complaint:   OBESITY Katrina Beard is here to discuss her progress with her obesity treatment plan along with follow-up of her obesity related diagnoses. Katrina Beard is on practicing portion control and making smarter food choices, such as increasing vegetables and decreasing simple carbohydrates and states she is following her eating plan approximately 80% of the time. Katrina Beard states she is walking 2 miles 3 times per week.  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 She is eating 3 meal per day and having protein at all meals. Her appetite is well controlled.  Subjective:   1. Type 2 diabetes mellitus with other specified complication, without long-term current use of insulin (HCC) Katrina Beard's diabetes mellitus is well controlled. She is on Wegovy 2.4 mg weekly and Metformin. She denies hypoglycemia. Her  LdL was at goal (75). She is on a statin.  Lab Results  Component Value Date   HGBA1C 6.0 (H) 06/24/2020   HGBA1C 5.9 (H) 01/08/2020   HGBA1C 6.3 (H) 08/06/2019   Lab Results  Component Value Date   LDLCALC 75 06/24/2020   CREATININE 1.08 (H) 06/24/2020   Lab Results  Component Value Date   INSULIN 11.2 04/03/2019    2. Vitamin D deficiency Katrina Beard's Vitamin D was low at 44.4. She is on weekly prescription Vitamin D.  Lab Results  Component Value Date   VD25OH 44.4 06/24/2020   VD25OH 55.2 01/08/2020   VD25OH 43.6 08/06/2019    3.Other depression, with emotional eating  Katrina Beard's mood is stable. Her cravings are well controlled.   4. At risk for heart disease Katrina Beard is at risk for heart disease due to diabetes mellitus and obesity.   Assessment/Plan:   1. Type 2 diabetes mellitus with other specified complication, without long-term current use of  insulin (HCC) We will check labs today. We will refill Metformin 500 mg daily for 1 month with no refills. We will refill Wegovy 2.4 mg weekly.  - metFORMIN (GLUCOPHAGE) 500 MG tablet; Take 1 tablet (500 mg total) by mouth daily with breakfast.  Dispense: 30 tablet; Refill: 0 - Semaglutide-Weight Management (WEGOVY) 2.4 MG/0.75ML SOAJ; Inject 2.4 mg into the skin once a week.  Dispense: 3 mL; Refill: 0 - Comprehensive metabolic panel - Hemoglobin A1c  2.  Vitamin D deficiency We will check labs today.Katrina Beard agrees to continue to take prescription Vitamin D 50,000 IU every week and she will follow-up for routine testing of Vitamin D, at least 2-3 times per year to avoid over-replacement.  - VITAMIN D 25 Hydroxy (Vit-D Deficiency, Fractures)  3. Other depression, with emotional eating We will refill bupropion 150 mg daily for 1 month with no refills. Orders and follow up as documented in patient record.    - buPROPion (WELLBUTRIN SR) 150 MG 12 hr tablet; Take 1 tablet (150 mg total) by mouth every morning.  Dispense: 30 tablet; Refill: 0  4. At risk for heart disease Katrina Beard was given approximately 15 minutes of coronary artery disease prevention counseling today. She is 60 y.o. female and has risk factors for heart disease including obesity. We discussed intensive lifestyle modifications today with an emphasis on specific weight loss instructions and strategies.   Repetitive spaced learning was employed today to elicit superior memory formation and behavioral change.  5. Obesity with current BMI of 35.35 Katrina Beard is currently in the action stage of change. As such, her goal is to continue with weight loss efforts. She has agreed to practicing portion control and making smarter food choices, such as increasing vegetables and decreasing simple carbohydrates.   Exercise goals:  As is.  Behavioral modification strategies: planning for success.  Katrina Beard has agreed to follow-up with our clinic in 3-4  weeks.  Objective:   Blood pressure (!) 148/84, pulse 71, temperature 97.9 F (36.6 C), height '5\' 1"'$  (1.549 m), weight 187 lb (84.8 kg), SpO2 98 %. Body mass index is 35.33 kg/m.  General: Cooperative, alert, well developed, in no acute distress. HEENT: Conjunctivae and lids unremarkable. Cardiovascular: Regular rhythm.  Lungs: Normal work of breathing. Neurologic: No focal deficits.   Lab Results  Component Value Date   CREATININE 1.08 (H) 06/24/2020   BUN 15 06/24/2020   NA 141 06/24/2020   K 3.9 06/24/2020   CL 103 06/24/2020   CO2 22 06/24/2020   Lab Results  Component Value Date   ALT 22 06/24/2020   AST 18 06/24/2020   ALKPHOS 95 06/24/2020   BILITOT 0.4 06/24/2020   Lab Results  Component Value Date   HGBA1C 6.0 (H) 06/24/2020   HGBA1C 5.9 (H) 01/08/2020   HGBA1C 6.3 (H) 08/06/2019   HGBA1C 7.2 (H) 04/03/2019   Lab Results  Component Value Date   INSULIN 11.2 04/03/2019   Lab Results  Component Value Date   TSH 0.575 04/03/2019   Lab Results  Component Value Date   CHOL 133 06/24/2020   HDL 43 06/24/2020   LDLCALC 75 06/24/2020   TRIG 75 06/24/2020   Lab Results  Component Value Date   VD25OH 44.4 06/24/2020   VD25OH 55.2 01/08/2020   VD25OH 43.6 08/06/2019   Lab Results  Component Value Date   WBC 7.2 06/24/2020   HGB 11.7 06/24/2020   HCT 35.1 06/24/2020   MCV 86 06/24/2020   PLT 357 06/24/2020   Lab Results  Component Value Date   IRON 81 06/24/2020   TIBC 252 06/24/2020   FERRITIN 64 06/24/2020    Attestation Statements:   Reviewed by clinician on day of visit: allergies, medications, problem list, medical history, surgical history, family history, social history, and previous encounter notes.  I, Lizbeth Bark, RMA, am acting as Location manager for [employer], [place].   I have reviewed the above documentation for accuracy and completeness, and I agree with the above. -  Georgianne Fick, FNP

## 2020-11-14 LAB — VITAMIN D 25 HYDROXY (VIT D DEFICIENCY, FRACTURES): Vit D, 25-Hydroxy: 48.4 ng/mL (ref 30.0–100.0)

## 2020-11-14 LAB — HEMOGLOBIN A1C
Est. average glucose Bld gHb Est-mCnc: 111 mg/dL
Hgb A1c MFr Bld: 5.5 % (ref 4.8–5.6)

## 2020-11-14 LAB — COMPREHENSIVE METABOLIC PANEL
ALT: 19 IU/L (ref 0–32)
AST: 16 IU/L (ref 0–40)
Albumin/Globulin Ratio: 1.3 (ref 1.2–2.2)
Albumin: 3.9 g/dL (ref 3.8–4.9)
Alkaline Phosphatase: 85 IU/L (ref 44–121)
BUN/Creatinine Ratio: 13 (ref 9–23)
BUN: 13 mg/dL (ref 6–24)
Bilirubin Total: 0.5 mg/dL (ref 0.0–1.2)
CO2: 24 mmol/L (ref 20–29)
Calcium: 9 mg/dL (ref 8.7–10.2)
Chloride: 101 mmol/L (ref 96–106)
Creatinine, Ser: 1.03 mg/dL — ABNORMAL HIGH (ref 0.57–1.00)
Globulin, Total: 3 g/dL (ref 1.5–4.5)
Glucose: 79 mg/dL (ref 65–99)
Potassium: 3.8 mmol/L (ref 3.5–5.2)
Sodium: 139 mmol/L (ref 134–144)
Total Protein: 6.9 g/dL (ref 6.0–8.5)
eGFR: 63 mL/min/{1.73_m2} (ref >59)

## 2020-11-29 ENCOUNTER — Encounter (INDEPENDENT_AMBULATORY_CARE_PROVIDER_SITE_OTHER): Payer: Self-pay | Admitting: Family Medicine

## 2020-12-02 ENCOUNTER — Encounter (INDEPENDENT_AMBULATORY_CARE_PROVIDER_SITE_OTHER): Payer: Self-pay

## 2020-12-04 ENCOUNTER — Other Ambulatory Visit: Payer: Self-pay

## 2020-12-04 ENCOUNTER — Encounter (INDEPENDENT_AMBULATORY_CARE_PROVIDER_SITE_OTHER): Payer: Self-pay | Admitting: Family Medicine

## 2020-12-04 ENCOUNTER — Ambulatory Visit (INDEPENDENT_AMBULATORY_CARE_PROVIDER_SITE_OTHER): Payer: BC Managed Care – PPO | Admitting: Family Medicine

## 2020-12-04 VITALS — BP 152/85 | HR 77 | Temp 98.0°F | Ht 61.0 in | Wt 183.0 lb

## 2020-12-04 DIAGNOSIS — Z6841 Body Mass Index (BMI) 40.0 and over, adult: Secondary | ICD-10-CM

## 2020-12-04 DIAGNOSIS — F3289 Other specified depressive episodes: Secondary | ICD-10-CM

## 2020-12-04 DIAGNOSIS — E1169 Type 2 diabetes mellitus with other specified complication: Secondary | ICD-10-CM

## 2020-12-04 MED ORDER — METFORMIN HCL 500 MG PO TABS: 500.0000 mg | ORAL_TABLET | Freq: Every day | ORAL | 0 refills | Status: DC

## 2020-12-04 MED ORDER — WEGOVY 2.4 MG/0.75ML ~~LOC~~ SOAJ: 2.4000 mg | SUBCUTANEOUS | 0 refills | Status: DC

## 2020-12-04 MED ORDER — BUPROPION HCL ER (SR) 150 MG PO TB12: 150.0000 mg | ORAL_TABLET | ORAL | 0 refills | Status: DC

## 2020-12-04 NOTE — Progress Notes (Signed)
Chief Complaint:   OBESITY Katrina Beard is here to discuss her progress with her obesity treatment plan along with follow-up of her obesity related diagnoses. Katrina Beard is on practicing portion control and making smarter food choices, such as increasing vegetables and decreasing simple carbohydrates and states she is following her eating plan approximately 80% of the time. Katrina Beard states she is doing 0 minutes 0 times per week.  Today's visit was #: 27 Starting weight: 231 lbs Starting date: 04/03/2019 Today's weight: 183 lbs Today's date: 12/04/2020 Total lbs lost to date: 48 lbs Total lbs lost since last in-office visit: 4 lbs  Interim History: Katrina Beard is not skipping meals. However, she does eat protein at all meals. 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 She denies intake of sugar sweetened beverages. She cooks dinner daily. She is a foster parent.  Subjective:   1. Type 2 diabetes mellitus with other specified complication, without long-term current use of insulin ([facility]CC) Katrina Beard's appetite is well controlled. Her last A1C was 5.5. She does not check CBGs. She denies constipation or nausea.  Lab Results  Component Value Date   HGBA1C 5.5 11/13/2020   HGBA1C 6.0 (H) 06/24/2020   HGBA1C 5.9 (H) 01/08/2020   Lab Results  Component Value Date   LDLCALC 75 06/24/2020   CREATININE 1.03 (H) 11/13/2020   Lab Results  Component Value Date   INSULIN 11.2 04/03/2019    2. Other depression, with emotional eating Katrina Beard's cravings are well controlled. Her mood is stable.   Assessment/Plan:   1. Type 2 diabetes mellitus with other specified complication, without long-term current use of insulin (HCC) We will refill Wegovy 2.4 mg weekly for 1 month with no refills. We will refill Metformin 500 mg daily for 1 month with no refills. Intensive lifestyle modification including diet, exercise and weight loss are the first line of treatment for diabetes.   - metFORMIN  (GLUCOPHAGE) 500 MG tablet; Take 1 tablet (500 mg total) by mouth daily with breakfast.  Dispense: 30 tablet; Refill: 0 - Semaglutide-Weight Management (WEGOVY) 2.4 MG/0.75ML SOAJ; Inject 2.4 mg into the skin once a week.  Dispense: 3 mL; Refill: 0  2. Other depression, with emotional eating  We will refill bupropion 150 mg every morning for 1 month with no refills. Orders and follow up as documented in patient record.    - buPROPion (WELLBUTRIN SR) 150 MG 12 hr tablet; Take 1 tablet (150 mg total) by mouth every morning.  Dispense: 30 tablet; Refill: 0  3. Obesity with current BMI of 34.6 Katrina Beard is currently in the action stage of change. As such, her goal is to continue with weight loss efforts. She has agreed to practicing portion control and making smarter food choices, such as increasing vegetables and decreasing simple carbohydrates.   Exercise goals:  Katrina Beard will increase walking.  Behavioral modification strategies: increasing lean protein intake.  Katrina Beard has agreed to follow-up with our clinic in 3 weeks.  Objective:   Blood pressure (!) 152/85, pulse 77, temperature 98 F (36.7 C), height 5\' 1"  (1.549 m), weight 183 lb (83 kg), SpO2 99 %. Body mass index is 34.58 kg/m.  General: Cooperative, alert, well developed, in no acute distress. HEENT: Conjunctivae and lids unremarkable. Cardiovascular: Regular rhythm.  Lungs: Normal work of breathing. Neurologic: No focal deficits.   Lab Results  Component Value Date   CREATININE 1.03 (H) 11/13/2020   BUN 13 11/13/2020   NA 139 11/13/2020  K 3.8 11/13/2020   CL 101 11/13/2020   CO2 24 11/13/2020   Lab Results  Component Value Date   ALT 19 11/13/2020   AST 16 11/13/2020   ALKPHOS 85 11/13/2020   BILITOT 0.5 11/13/2020   Lab Results  Component Value Date   HGBA1C 5.5 11/13/2020   HGBA1C 6.0 (H) 06/24/2020   HGBA1C 5.9 (H) 01/08/2020   HGBA1C 6.3 (H) 08/06/2019   HGBA1C 7.2 (H) 04/03/2019   Lab Results  Component  Value Date   INSULIN 11.2 04/03/2019   Lab Results  Component Value Date   TSH 0.575 04/03/2019   Lab Results  Component Value Date   CHOL 133 06/24/2020   HDL 43 06/24/2020   LDLCALC 75 06/24/2020   TRIG 75 06/24/2020   Lab Results  Component Value Date   VD25OH 48.4 11/13/2020   VD25OH 44.4 06/24/2020   VD25OH 55.2 01/08/2020   Lab Results  Component Value Date   WBC 7.2 06/24/2020   HGB 11.7 06/24/2020   HCT 35.1 06/24/2020   MCV 86 06/24/2020   PLT 357 06/24/2020   Lab Results  Component Value Date   IRON 81 06/24/2020   TIBC 252 06/24/2020   FERRITIN 64 06/24/2020    Attestation Statements:   Reviewed by clinician on day of visit: allergies, medications, problem list, medical history, surgical history, family history, social history, and previous encounter notes.   I, Lizbeth Bark, RMA, am acting as Location manager for [employer], [place].   I have reviewed the above documentation for accuracy and completeness, and I agree with the above. -  Georgianne Fick, FNP

## 2020-12-10 ENCOUNTER — Encounter (INDEPENDENT_AMBULATORY_CARE_PROVIDER_SITE_OTHER): Payer: Self-pay

## 2020-12-23 ENCOUNTER — Other Ambulatory Visit: Payer: Self-pay | Admitting: Family Medicine

## 2020-12-23 DIAGNOSIS — Z1231 Encounter for screening mammogram for malignant neoplasm of breast: Secondary | ICD-10-CM

## 2020-12-25 ENCOUNTER — Ambulatory Visit (INDEPENDENT_AMBULATORY_CARE_PROVIDER_SITE_OTHER): Payer: BC Managed Care – PPO | Admitting: Family Medicine

## 2020-12-30 ENCOUNTER — Ambulatory Visit (INDEPENDENT_AMBULATORY_CARE_PROVIDER_SITE_OTHER): Payer: BC Managed Care – PPO | Admitting: Family Medicine

## 2020-12-30 ENCOUNTER — Encounter (INDEPENDENT_AMBULATORY_CARE_PROVIDER_SITE_OTHER): Payer: Self-pay | Admitting: Family Medicine

## 2020-12-30 ENCOUNTER — Other Ambulatory Visit: Payer: Self-pay

## 2020-12-30 VITALS — BP 169/83 | HR 76 | Temp 98.0°F | Ht 61.0 in | Wt 181.0 lb

## 2020-12-30 DIAGNOSIS — F3289 Other specified depressive episodes: Secondary | ICD-10-CM

## 2020-12-30 DIAGNOSIS — Z6841 Body Mass Index (BMI) 40.0 and over, adult: Secondary | ICD-10-CM

## 2020-12-30 DIAGNOSIS — Z9189 Other specified personal risk factors, not elsewhere classified: Secondary | ICD-10-CM

## 2020-12-30 DIAGNOSIS — E559 Vitamin D deficiency, unspecified: Secondary | ICD-10-CM | POA: Diagnosis not present

## 2020-12-30 DIAGNOSIS — I10 Essential (primary) hypertension: Secondary | ICD-10-CM | POA: Diagnosis not present

## 2020-12-30 MED ORDER — WEGOVY 2.4 MG/0.75ML ~~LOC~~ SOAJ: 2.4000 mg | SUBCUTANEOUS | 0 refills | Status: DC

## 2020-12-30 MED ORDER — BUPROPION HCL ER (SR) 150 MG PO TB12: 150.0000 mg | ORAL_TABLET | ORAL | 0 refills | Status: DC

## 2020-12-30 MED ORDER — AMLODIPINE BESYLATE 5 MG PO TABS: 5.0000 mg | ORAL_TABLET | Freq: Every morning | ORAL | 0 refills | Status: DC

## 2020-12-30 MED ORDER — VITAMIN D (ERGOCALCIFEROL) 1.25 MG (50000 UNIT) PO CAPS: 50000.0000 [IU] | ORAL_CAPSULE | ORAL | 0 refills | Status: DC

## 2020-12-30 NOTE — Progress Notes (Signed)
Chief Complaint:   OBESITY Katrina Beard is here to discuss her progress with her obesity treatment plan along with follow-up of her obesity related diagnoses. [name] is on practicing portion control and making smarter food choices, such as increasing vegetables and decreasing simple carbohydrates and states she is following her eating plan approximately 0% of the time. [name] states she is doing 0 minutes 0 times per week.  Today's visit was #: 79 Starting weight: 231 lbs Starting date: 04/03/2019 Today's weight: 181 lbs Today's date: 12/30/2020 Total lbs lost to date: 50 Total lbs lost since last in-office visit: 2  Interim History: Katrina Beard continues to do well with weight loss on her Wegovy. She is not following a plan but simply trying to portion control.  Subjective:   1. Essential hypertension Katrina Beard's blood pressure remains elevated today. She is on losartan but she has increased work stress.  2. Vitamin D deficiency Katrina Beard is stable on Vit D, but her level is not yet at goal.  3. Other depression, with emotional eating Katrina Beard is stable on Wellbutrin, and she has a lot of stress in her job. She is working on decreasing emotional eating behaviors.  4. At risk for heart disease Katrina Beard is at a higher than average risk for cardiovascular disease due to obesity.   Assessment/Plan:   1. Essential hypertension Katrina Beard agreed to start amlodipine 5 mg q AM with no refills, and she will continue losartan. She will continue working on healthy weight loss and exercise to improve blood pressure control. She will watch for signs of hypotension as she continues her lifestyle modifications.  - amLODipine (NORVASC) 5 MG tablet; Take 1 tablet (5 mg total) by mouth in the morning.  Dispense: 30 tablet; Refill: 0  2. Vitamin D deficiency Low Vitamin D level contributes to fatigue and are associated with obesity, breast, and colon cancer. We will refill prescription Vitamin D for 1 month. Katrina Beard will  follow-up for routine testing of Vitamin D, at least 2-3 times per year to avoid over-replacement.  - Vitamin D, Ergocalciferol, (DRISDOL) 1.25 MG (50000 UNIT) CAPS capsule; Take 1 capsule (50,000 Units total) by mouth every 7 (seven) days.  Dispense: 4 capsule; Refill: 0  3. Other depression, with emotional eating Behavior modification techniques were discussed today to help Shavonn deal with her emotional/non-hunger eating behaviors. We will refill Wellbutrin SR for 1 month. Orders and follow up as documented in patient record.   - buPROPion (WELLBUTRIN SR) 150 MG 12 hr tablet; Take 1 tablet (150 mg total) by mouth every morning.  Dispense: 30 tablet; Refill: 0  4. At risk for heart disease Katrina Beard was given approximately 15 minutes of coronary artery disease prevention counseling today. She is 60 y.o. female and has risk factors for heart disease including obesity. We discussed intensive lifestyle modifications today with an emphasis on specific weight loss instructions and strategies.   Repetitive spaced learning was employed today to elicit superior memory formation and behavioral change.  5. Obesity with current BMI of 34.3 [name] is currently in the action stage of change. As such, her goal is to continue with weight loss efforts. She has agreed to practicing portion control and making smarter food choices, such as increasing vegetables and decreasing simple carbohydrates.   We discussed various medication options to help Katrina Beard with her weight loss efforts and we both agreed to continue Wegovy, and we will refill for 1 month.  - Semaglutide-Weight Management (WEGOVY) 2.4 MG/0.75ML SOAJ; Inject 2.4 mg  into the skin once a week.  Dispense: 3 mL; Refill: 0  Exercise goals: All adults should avoid inactivity. Some physical activity is better than none, and adults who participate in any amount of physical activity gain some health benefits.  Behavioral modification strategies: increasing lean  protein intake, increasing vegetables, and increasing water intake.  Katrina Beard has agreed to follow-up with our clinic in 3 weeks. She was informed of the importance of frequent follow-up visits to maximize her success with intensive lifestyle modifications for her multiple health conditions.   Objective:   Blood pressure (!) 169/83, pulse 76, temperature 98 F (36.7 C), height 5\' 1"  (1.549 m), weight 181 lb (82.1 kg), SpO2 96 %. Body mass index is 34.2 kg/m.  General: Cooperative, alert, well developed, in no acute distress. HEENT: Conjunctivae and lids unremarkable. Cardiovascular: Regular rhythm.  Lungs: Normal work of breathing. Neurologic: No focal deficits.   Lab Results  Component Value Date   CREATININE 1.03 (H) 11/13/2020   BUN 13 11/13/2020   NA 139 11/13/2020   K 3.8 11/13/2020   CL 101 11/13/2020   CO2 24 11/13/2020   Lab Results  Component Value Date   ALT 19 11/13/2020   AST 16 11/13/2020   ALKPHOS 85 11/13/2020   BILITOT 0.5 11/13/2020   Lab Results  Component Value Date   HGBA1C 5.5 11/13/2020   HGBA1C 6.0 (H) 06/24/2020   HGBA1C 5.9 (H) 01/08/2020   HGBA1C 6.3 (H) 08/06/2019   HGBA1C 7.2 (H) 04/03/2019   Lab Results  Component Value Date   INSULIN 11.2 04/03/2019   Lab Results  Component Value Date   TSH 0.575 04/03/2019   Lab Results  Component Value Date   CHOL 133 06/24/2020   HDL 43 06/24/2020   LDLCALC 75 06/24/2020   TRIG 75 06/24/2020   Lab Results  Component Value Date   VD25OH 48.4 11/13/2020   VD25OH 44.4 06/24/2020   VD25OH 55.2 01/08/2020   Lab Results  Component Value Date   WBC 7.2 06/24/2020   HGB 11.7 06/24/2020   HCT 35.1 06/24/2020   MCV 86 06/24/2020   PLT 357 06/24/2020   Lab Results  Component Value Date   IRON 81 06/24/2020   TIBC 252 06/24/2020   FERRITIN 64 06/24/2020   Attestation Statements:   Reviewed by clinician on day of visit: allergies, medications, problem list, medical history, surgical  history, family history, social history, and previous encounter notes.   I, Trixie Dredge, am acting as transcriptionist for Dennard Nip, MD.  I have reviewed the above documentation for accuracy and completeness, and I agree with the above. -  Dennard Nip, MD

## 2021-01-27 ENCOUNTER — Ambulatory Visit (INDEPENDENT_AMBULATORY_CARE_PROVIDER_SITE_OTHER): Payer: BC Managed Care – PPO | Admitting: Family Medicine

## 2021-01-27 ENCOUNTER — Other Ambulatory Visit: Payer: Self-pay

## 2021-01-27 ENCOUNTER — Encounter (INDEPENDENT_AMBULATORY_CARE_PROVIDER_SITE_OTHER): Payer: Self-pay | Admitting: Family Medicine

## 2021-01-27 VITALS — BP 165/84 | HR 67 | Temp 97.9°F | Ht 61.0 in | Wt 184.0 lb

## 2021-01-27 DIAGNOSIS — F3289 Other specified depressive episodes: Secondary | ICD-10-CM | POA: Diagnosis not present

## 2021-01-27 DIAGNOSIS — Z6841 Body Mass Index (BMI) 40.0 and over, adult: Secondary | ICD-10-CM

## 2021-01-27 DIAGNOSIS — I10 Essential (primary) hypertension: Secondary | ICD-10-CM | POA: Diagnosis not present

## 2021-01-27 DIAGNOSIS — E1169 Type 2 diabetes mellitus with other specified complication: Secondary | ICD-10-CM

## 2021-01-27 DIAGNOSIS — E559 Vitamin D deficiency, unspecified: Secondary | ICD-10-CM

## 2021-01-27 DIAGNOSIS — Z9189 Other specified personal risk factors, not elsewhere classified: Secondary | ICD-10-CM | POA: Diagnosis not present

## 2021-01-27 MED ORDER — VITAMIN D (ERGOCALCIFEROL) 1.25 MG (50000 UNIT) PO CAPS
50000.0000 [IU] | ORAL_CAPSULE | ORAL | 0 refills | Status: DC
Start: 2021-01-27 — End: 2021-02-25

## 2021-01-27 MED ORDER — BUPROPION HCL ER (SR) 150 MG PO TB12: 150.0000 mg | ORAL_TABLET | ORAL | 0 refills | Status: DC

## 2021-01-27 MED ORDER — WEGOVY 2.4 MG/0.75ML ~~LOC~~ SOAJ: 2.4000 mg | SUBCUTANEOUS | 0 refills | Status: DC

## 2021-01-27 MED ORDER — METFORMIN HCL 500 MG PO TABS: 500.0000 mg | ORAL_TABLET | Freq: Every day | ORAL | 0 refills | Status: DC

## 2021-01-27 NOTE — Progress Notes (Signed)
Chief Complaint:   OBESITY Katrina Beard is here to discuss her progress with her obesity treatment plan along with follow-up of her obesity related diagnoses. Katrina Beard is on practicing portion control and making smarter food choices, such as increasing vegetables and decreasing simple carbohydrates and states she is following her eating plan approximately 75% of the time. Katrina Beard states she did some walking at the [employer] parade.   Today's visit was #: 45 Starting weight: 231 lbs Starting date: 04/03/2019 Today's weight: 184 lbs Today's date: 01/27/2021 Total lbs lost to date: 60 Total lbs lost since last in-office visit: 0  Interim History: Katrina Beard notes increased simple carbohydrates in her diet recently, and she has gotten off track. Her hunger is controlled on Wegovy with nausea only if she overindulges. She is status post gastric bypass.  Subjective:   1. Type 2 diabetes mellitus with other specified complication, without long-term current use of insulin (HCC) Katrina Beard is on metformin and she is working on diet and weight loss, but she is struggling with increased simple carbohydrates.  2. Vitamin D deficiency Katrina Beard is stable on Vit D, and she has no signs of over-replacement.  3. Essential hypertension Katrina Beard's blood pressure is elevated, no improvement with diet and exercise. She notes increased stress at work with a job she really dislikes.  4. Other depression, with emotional eating Katrina Beard is stable on her medications, and she is still dealing with stress at work and resulting in stress eating.  5. At risk for heart disease Katrina Beard is at a higher than average risk for cardiovascular disease due to obesity.   Assessment/Plan:   1. Type 2 diabetes mellitus with other specified complication, without long-term current use of insulin (HCC) Katrina Beard will continue her medications, and we will refill metformin for 1 month. Good blood sugar control is important to decrease the likelihood of diabetic  complications such as nephropathy, neuropathy, limb loss, blindness, coronary artery disease, and death. Intensive lifestyle modification including diet, exercise and weight loss are the first line of treatment for diabetes.   - metFORMIN (GLUCOPHAGE) 500 MG tablet; Take 1 tablet (500 mg total) by mouth daily with breakfast.  Dispense: 30 tablet; Refill: 0  2. Vitamin D deficiency Low Vitamin D level contributes to fatigue and are associated with obesity, breast, and colon cancer. We will refill prescription Vitamin D 50,000 IU for 1 month will follow-up for routine testing of Vitamin D, at least 2-3 times per year to avoid over-replacement.  - Vitamin D, Ergocalciferol, (DRISDOL) 1.25 MG (50000 UNIT) CAPS capsule; Take 1 capsule (50,000 Units total) by mouth every 7 (seven) days.  Dispense: 4 capsule; Refill: 0  3. Essential hypertension Katrina Beard will continue with diet and exercise to improve blood pressure control. She will follow up with her primary care physician in the next month to look at other options. We will watch for signs of hypotension as she continues her lifestyle modifications.  4. Other depression, with emotional eating Emotional eating behavior strategies were discussed today to help Katrina Beard deal with her emotional/non-hunger eating behaviors. We will refill Wellbutrin SR for 1 month. Orders and follow up as documented in patient record.   - buPROPion (WELLBUTRIN SR) 150 MG 12 hr tablet; Take 1 tablet (150 mg total) by mouth every morning.  Dispense: 30 tablet; Refill: 0  5. At risk for heart disease Katrina Beard was given approximately 15 minutes of coronary artery disease prevention counseling today. She is 60 y.o. female and has risk factors for  heart disease including obesity. We discussed intensive lifestyle modifications today with an emphasis on specific weight loss instructions and strategies.   Repetitive spaced learning was employed today to elicit superior memory formation and  behavioral change.  6. Obesity with current BMI of 34.9 Katrina Beard is currently in the action stage of change. As such, her goal is to continue with weight loss efforts. She has agreed to practicing portion control and making smarter food choices, such as increasing vegetables and decreasing simple carbohydrates.   We discussed various medication options to help Katrina Beard with her weight loss efforts and we both agreed to continue Wegovy, and we will refill for 1 month.  - Semaglutide-Weight Management (WEGOVY) 2.4 MG/0.75ML SOAJ; Inject 2.4 mg into the skin once a week.  Dispense: 3 mL; Refill: 0  Behavioral modification strategies: holiday eating strategies .  Katrina Beard has agreed to follow-up with our clinic in 4 weeks. She was informed of the importance of frequent follow-up visits to maximize her success with intensive lifestyle modifications for her multiple health conditions.   Objective:   Blood pressure (!) 165/84, pulse 67, temperature 97.9 F (36.6 C), height 5\' 1"  (1.549 m), weight 184 lb (83.5 kg), SpO2 100 %. Body mass index is 34.77 kg/m.  General: Cooperative, alert, well developed, in no acute distress. HEENT: Conjunctivae and lids unremarkable. Cardiovascular: Regular rhythm.  Lungs: Normal work of breathing. Neurologic: No focal deficits.   Lab Results  Component Value Date   CREATININE 1.03 (H) 11/13/2020   BUN 13 11/13/2020   NA 139 11/13/2020   K 3.8 11/13/2020   CL 101 11/13/2020   CO2 24 11/13/2020   Lab Results  Component Value Date   ALT 19 11/13/2020   AST 16 11/13/2020   ALKPHOS 85 11/13/2020   BILITOT 0.5 11/13/2020   Lab Results  Component Value Date   HGBA1C 5.5 11/13/2020   HGBA1C 6.0 (H) 06/24/2020   HGBA1C 5.9 (H) 01/08/2020   HGBA1C 6.3 (H) 08/06/2019   HGBA1C 7.2 (H) 04/03/2019   Lab Results  Component Value Date   INSULIN 11.2 04/03/2019   Lab Results  Component Value Date   TSH 0.575 04/03/2019   Lab Results  Component Value Date    CHOL 133 06/24/2020   HDL 43 06/24/2020   LDLCALC 75 06/24/2020   TRIG 75 06/24/2020   Lab Results  Component Value Date   VD25OH 48.4 11/13/2020   VD25OH 44.4 06/24/2020   VD25OH 55.2 01/08/2020   Lab Results  Component Value Date   WBC 7.2 06/24/2020   HGB 11.7 06/24/2020   HCT 35.1 06/24/2020   MCV 86 06/24/2020   PLT 357 06/24/2020   Lab Results  Component Value Date   IRON 81 06/24/2020   TIBC 252 06/24/2020   FERRITIN 64 06/24/2020   Attestation Statements:   Reviewed by clinician on day of visit: allergies, medications, problem list, medical history, surgical history, family history, social history, and previous encounter notes.   I, Trixie Dredge, am acting as transcriptionist for Dennard Nip, MD.  I have reviewed the above documentation for accuracy and completeness, and I agree with the above. -  Dennard Nip, MD

## 2021-02-19 ENCOUNTER — Other Ambulatory Visit (INDEPENDENT_AMBULATORY_CARE_PROVIDER_SITE_OTHER): Payer: Self-pay | Admitting: Family Medicine

## 2021-02-19 DIAGNOSIS — E559 Vitamin D deficiency, unspecified: Secondary | ICD-10-CM

## 2021-02-19 NOTE — Telephone Encounter (Signed)
Pt last seen by Dr. Beasley.  

## 2021-02-25 ENCOUNTER — Other Ambulatory Visit: Payer: Self-pay

## 2021-02-25 ENCOUNTER — Ambulatory Visit (INDEPENDENT_AMBULATORY_CARE_PROVIDER_SITE_OTHER): Payer: BC Managed Care – PPO | Admitting: Family Medicine

## 2021-02-25 ENCOUNTER — Encounter (INDEPENDENT_AMBULATORY_CARE_PROVIDER_SITE_OTHER): Payer: Self-pay | Admitting: Family Medicine

## 2021-02-25 VITALS — BP 171/82 | HR 75 | Temp 98.1°F | Ht 61.0 in | Wt 177.0 lb

## 2021-02-25 DIAGNOSIS — I10 Essential (primary) hypertension: Secondary | ICD-10-CM

## 2021-02-25 DIAGNOSIS — Z9189 Other specified personal risk factors, not elsewhere classified: Secondary | ICD-10-CM | POA: Diagnosis not present

## 2021-02-25 DIAGNOSIS — Z6841 Body Mass Index (BMI) 40.0 and over, adult: Secondary | ICD-10-CM | POA: Diagnosis not present

## 2021-02-25 DIAGNOSIS — E559 Vitamin D deficiency, unspecified: Secondary | ICD-10-CM | POA: Diagnosis not present

## 2021-02-25 MED ORDER — VITAMIN D (ERGOCALCIFEROL) 1.25 MG (50000 UNIT) PO CAPS: 50000.0000 [IU] | ORAL_CAPSULE | ORAL | 0 refills | Status: DC

## 2021-02-25 MED ORDER — WEGOVY 2.4 MG/0.75ML ~~LOC~~ SOAJ: 2.4000 mg | SUBCUTANEOUS | 0 refills | Status: DC

## 2021-02-25 NOTE — Progress Notes (Signed)
Chief Complaint:   OBESITY Katrina Beard is here to discuss her progress with her obesity treatment plan along with follow-up of her obesity related diagnoses. Katrina Beard is on practicing portion control and making smarter food choices, such as increasing vegetables and decreasing simple carbohydrates and states she is following her eating plan approximately 40% of the time. Katrina Beard states she is doing 0 minutes 0 times per week.  Today's visit was #: 49 Starting weight: 231 lbs Starting date: 04/03/2019 Today's weight: 177 lbs Today's date: 02/25/2021 Total lbs lost to date: 25 Total lbs lost since last in-office visit: 7  Interim History: Katrina Beard continues to do well with weight loss. Her hunger is controlled and she is doing well with avoiding snacking and temptations.  Subjective:   1. Essential hypertension Katrina Beard's blood pressure I elevated today. She saw her primary care physician at [facility], and she said her medications were changed but she is not sure which medication and dose.  2. Vitamin D deficiency Katrina Beard is on Vit D, and she denies nausea, vomiting, or muscle weakness. Her Vit D level is not yet at goal.  3. At risk for heart disease Katrina Beard is at a higher than average risk for cardiovascular disease due to obesity.   Assessment/Plan:   1. Essential hypertension Katrina Beard is to continue with diet and exercise, and she is to contact her primary care physician about her elevated blood pressure.  2. Vitamin D deficiency We will refill prescription Vitamin D 50,000 IU every week for 1 month. Katrina Beard will follow-up for routine testing of Vitamin D, at least 2-3 times per year to avoid over-replacement.  - Vitamin D, Ergocalciferol, (DRISDOL) 1.25 MG (50000 UNIT) CAPS capsule; Take 1 capsule (50,000 Units total) by mouth every 7 (seven) days.  Dispense: 4 capsule; Refill: 0  3. At risk for heart disease Katrina Beard was given approximately 15 minutes of coronary artery disease prevention counseling  today. She is 60 y.o. female and has risk factors for heart disease including obesity. We discussed intensive lifestyle modifications today with an emphasis on specific weight loss instructions and strategies.   Repetitive spaced learning was employed today to elicit superior memory formation and behavioral change.  4. Obesity with current BMI of 33.6 Katrina Beard is currently in the action stage of change. As such, her goal is to continue with weight loss efforts. She has agreed to practicing portion control and making smarter food choices, such as increasing vegetables and decreasing simple carbohydrates.   We discussed various medication options to help Katrina Beard with her weight loss efforts and we both agreed to continue Wegovy 2.4 mg, and we will refill for 1 month.  - Semaglutide-Weight Management (WEGOVY) 2.4 MG/0.75ML SOAJ; Inject 2.4 mg into the skin once a week.  Dispense: 3 mL; Refill: 0  Behavioral modification strategies: increasing lean protein intake, meal planning and cooking strategies, and holiday eating strategies .  Katrina Beard has agreed to follow-up with our clinic in 4 weeks. She was informed of the importance of frequent follow-up visits to maximize her success with intensive lifestyle modifications for her multiple health conditions.   Objective:   Blood pressure (!) 171/82, pulse 75, temperature 98.1 F (36.7 C), height 5\' 1"  (1.549 m), weight 177 lb (80.3 kg), SpO2 99 %. Body mass index is 33.44 kg/m.  General: Cooperative, alert, well developed, in no acute distress. HEENT: Conjunctivae and lids unremarkable. Cardiovascular: Regular rhythm.  Lungs: Normal work of breathing. Neurologic: No focal deficits.   Lab Results  Component Value Date   CREATININE 1.03 (H) 11/13/2020   BUN 13 11/13/2020   NA 139 11/13/2020   K 3.8 11/13/2020   CL 101 11/13/2020   CO2 24 11/13/2020   Lab Results  Component Value Date   ALT 19 11/13/2020   AST 16 11/13/2020   ALKPHOS 85  11/13/2020   BILITOT 0.5 11/13/2020   Lab Results  Component Value Date   HGBA1C 5.5 11/13/2020   HGBA1C 6.0 (H) 06/24/2020   HGBA1C 5.9 (H) 01/08/2020   HGBA1C 6.3 (H) 08/06/2019   HGBA1C 7.2 (H) 04/03/2019   Lab Results  Component Value Date   INSULIN 11.2 04/03/2019   Lab Results  Component Value Date   TSH 0.575 04/03/2019   Lab Results  Component Value Date   CHOL 133 06/24/2020   HDL 43 06/24/2020   LDLCALC 75 06/24/2020   TRIG 75 06/24/2020   Lab Results  Component Value Date   VD25OH 48.4 11/13/2020   VD25OH 44.4 06/24/2020   VD25OH 55.2 01/08/2020   Lab Results  Component Value Date   WBC 7.2 06/24/2020   HGB 11.7 06/24/2020   HCT 35.1 06/24/2020   MCV 86 06/24/2020   PLT 357 06/24/2020   Lab Results  Component Value Date   IRON 81 06/24/2020   TIBC 252 06/24/2020   FERRITIN 64 06/24/2020   Attestation Statements:   Reviewed by clinician on day of visit: allergies, medications, problem list, medical history, surgical history, family history, social history, and previous encounter notes.   I, Trixie Dredge, am acting as transcriptionist for Dennard Nip, MD.  I have reviewed the above documentation for accuracy and completeness, and I agree with the above. -  Dennard Nip, MD

## 2021-02-28 ENCOUNTER — Encounter (INDEPENDENT_AMBULATORY_CARE_PROVIDER_SITE_OTHER): Payer: Self-pay | Admitting: Family Medicine

## 2021-03-02 ENCOUNTER — Other Ambulatory Visit (INDEPENDENT_AMBULATORY_CARE_PROVIDER_SITE_OTHER): Payer: Self-pay | Admitting: Family Medicine

## 2021-03-02 DIAGNOSIS — I10 Essential (primary) hypertension: Secondary | ICD-10-CM

## 2021-03-02 DIAGNOSIS — F3289 Other specified depressive episodes: Secondary | ICD-10-CM

## 2021-03-02 DIAGNOSIS — E1169 Type 2 diabetes mellitus with other specified complication: Secondary | ICD-10-CM

## 2021-03-02 NOTE — Telephone Encounter (Signed)
Dr.Beasley 

## 2021-03-02 NOTE — Telephone Encounter (Signed)
Pt last seen by Dr. Beasley.  

## 2021-03-02 NOTE — Telephone Encounter (Signed)
LAST APPOINTMENT DATE: 02/25/21 NEXT APPOINTMENT DATE: 03/25/21   [place] ([place] - [address] [place] ([place]) [place] [address] Phone: [phone] Fax: [phone]  Patient is requesting a refill of the following medications: Requested Prescriptions   Pending Prescriptions Disp Refills   amLODipine (NORVASC) 5 MG tablet [Pharmacy Med Name: amLODIPine Besylate 5 MG Oral Tablet] 30 tablet 0    Sig: TAKE 1 TABLET BY MOUTH IN THE MORNING   metFORMIN (GLUCOPHAGE) 500 MG tablet [Pharmacy Med Name: metFORMIN HCl 500 MG Oral Tablet] 30 tablet 0    Sig: Take 1 tablet by mouth once daily with breakfast   buPROPion (WELLBUTRIN SR) 150 MG 12 hr tablet [Pharmacy Med Name: buPROPion HCl ER (SR) 150 MG Oral Tablet Extended Release 12 Hour] 30 tablet 0    Sig: TAKE 1 TABLET BY MOUTH ONCE DAILY IN THE MORNING    Date last filled: 01/27/21 Previously prescribed by Dr. Leafy Ro  Lab Results  Component Value Date   HGBA1C 5.5 11/13/2020   HGBA1C 6.0 (H) 06/24/2020   HGBA1C 5.9 (H) 01/08/2020   Lab Results  Component Value Date   LDLCALC 75 06/24/2020   CREATININE 1.03 (H) 11/13/2020   Lab Results  Component Value Date   VD25OH 48.4 11/13/2020   VD25OH 44.4 06/24/2020   VD25OH 55.2 01/08/2020    BP Readings from Last 3 Encounters:  02/25/21 (!) 171/82  01/27/21 (!) 165/84  12/30/20 (!) 169/83

## 2021-03-25 ENCOUNTER — Other Ambulatory Visit: Payer: Self-pay

## 2021-03-25 ENCOUNTER — Ambulatory Visit (INDEPENDENT_AMBULATORY_CARE_PROVIDER_SITE_OTHER): Payer: BC Managed Care – PPO | Admitting: Physician Assistant

## 2021-03-25 ENCOUNTER — Encounter (INDEPENDENT_AMBULATORY_CARE_PROVIDER_SITE_OTHER): Payer: Self-pay | Admitting: Physician Assistant

## 2021-03-25 VITALS — BP 160/85 | HR 74 | Temp 97.9°F | Ht 61.0 in | Wt 178.0 lb

## 2021-03-25 DIAGNOSIS — I152 Hypertension secondary to endocrine disorders: Secondary | ICD-10-CM

## 2021-03-25 DIAGNOSIS — Z6833 Body mass index (BMI) 33.0-33.9, adult: Secondary | ICD-10-CM

## 2021-03-25 DIAGNOSIS — E559 Vitamin D deficiency, unspecified: Secondary | ICD-10-CM

## 2021-03-25 DIAGNOSIS — E1159 Type 2 diabetes mellitus with other circulatory complications: Secondary | ICD-10-CM | POA: Diagnosis not present

## 2021-03-25 DIAGNOSIS — Z6841 Body Mass Index (BMI) 40.0 and over, adult: Secondary | ICD-10-CM

## 2021-03-25 DIAGNOSIS — Z9189 Other specified personal risk factors, not elsewhere classified: Secondary | ICD-10-CM | POA: Diagnosis not present

## 2021-03-25 MED ORDER — VITAMIN D (ERGOCALCIFEROL) 1.25 MG (50000 UNIT) PO CAPS: 50000.0000 [IU] | ORAL_CAPSULE | ORAL | 0 refills | Status: DC

## 2021-03-25 MED ORDER — WEGOVY 2.4 MG/0.75ML ~~LOC~~ SOAJ: 2.4000 mg | SUBCUTANEOUS | 0 refills | Status: DC

## 2021-03-25 MED ORDER — AMLODIPINE BESYLATE 10 MG PO TABS: 10.0000 mg | ORAL_TABLET | Freq: Every day | ORAL | 0 refills | Status: DC

## 2021-03-25 NOTE — Progress Notes (Signed)
Chief Complaint:   OBESITY Katrina Beard is here to discuss her progress with her obesity treatment plan along with follow-up of her obesity related diagnoses. Katrina Beard is on practicing portion control and making smarter food choices, such as increasing vegetables and decreasing simple carbohydrates and states she is following her eating plan approximately 60% of the time. Katrina Beard states she is doing 0 minutes 0 times per week.  Today's visit was #: 59 Starting weight: 231 lbs Starting date: 04/03/2019 Today's weight: 178 lbs Today's date: 03/25/2021 Total lbs lost to date: 90 Total lbs lost since last in-office visit: 0  Interim History: Katrina Beard is eating 3 meals a day. She is drinking protein shakes in the morning, but overall she is not getting enough protein in during the day.  Subjective:   1. Vitamin D deficiency Katrina Beard is on Vitamin D weekly.  2. Hypertension associated with type 2 diabetes mellitus ([place]) Andi's blood pressure is elevated. She took her medications this morning. She is stressed this morning, and her blood pressure is not controlled.  3. At risk for heart disease Katrina Beard is at a higher than average risk for cardiovascular disease due to obesity.   Assessment/Plan:   1. Vitamin D deficiency Low Vitamin D level contributes to fatigue and are associated with obesity, breast, and colon cancer. We will refill prescription Vitamin D for 1 month. Katrina Beard will follow-up for routine testing of Vitamin D, at least 2-3 times per year to avoid over-replacement.  - Vitamin D, Ergocalciferol, (DRISDOL) 1.25 MG (50000 UNIT) CAPS capsule; Take 1 capsule (50,000 Units total) by mouth every 7 (seven) days.  Dispense: 4 capsule; Refill: 0  2. Hypertension associated with type 2 diabetes mellitus ([place]) [name] agreed to increase Norvasc to 10 mg daily with no refills. She will follow up with her primary care physician in the next month. She will continue working on healthy weight loss and  exercise to improve blood pressure control. We will watch for signs of hypotension as she continues her lifestyle modifications.  - amLODipine (NORVASC) 10 MG tablet; Take 1 tablet (10 mg total) by mouth daily.  Dispense: 30 tablet; Refill: 0  3. At risk for heart disease Katrina Beard was given approximately 15 minutes of coronary artery disease prevention counseling today. She is 61 y.o. female and has risk factors for heart disease including obesity. We discussed intensive lifestyle modifications today with an emphasis on specific weight loss instructions and strategies.   Repetitive spaced learning was employed today to elicit superior memory formation and behavioral change.  4. Obesity with current BMI of 33.46 Katrina Beard is currently in the action stage of change. As such, her goal is to continue with weight loss efforts. She has agreed to practicing portion control and making smarter food choices, such as increasing vegetables and decreasing simple carbohydrates.   We discussed various medication options to help Katrina Beard with her weight loss efforts and we both agreed to continue Wegovy, and we will refill for 1 month.  - Semaglutide-Weight Management (WEGOVY) 2.4 MG/0.75ML SOAJ; Inject 2.4 mg into the skin once a week.  Dispense: 3 mL; Refill: 0  Exercise goals: No exercise has been prescribed at this time.  Behavioral modification strategies: increasing lean protein intake and no skipping meals.  Katrina Beard has agreed to follow-up with our clinic in 3 weeks. She was informed of the importance of frequent follow-up visits to maximize her success with intensive lifestyle modifications for her multiple health conditions.   Objective:  Blood pressure (!) 160/85, pulse 74, temperature 97.9 F (36.6 C), temperature source Oral, height 5\' 1"  (1.549 m), weight 178 lb (80.7 kg), SpO2 100 %. Body mass index is 33.63 kg/m.  General: Cooperative, alert, well developed, in no acute distress. HEENT:  Conjunctivae and lids unremarkable. Cardiovascular: Regular rhythm.  Lungs: Normal work of breathing. Neurologic: No focal deficits.   Lab Results  Component Value Date   CREATININE 1.03 (H) 11/13/2020   BUN 13 11/13/2020   NA 139 11/13/2020   K 3.8 11/13/2020   CL 101 11/13/2020   CO2 24 11/13/2020   Lab Results  Component Value Date   ALT 19 11/13/2020   AST 16 11/13/2020   ALKPHOS 85 11/13/2020   BILITOT 0.5 11/13/2020   Lab Results  Component Value Date   HGBA1C 5.5 11/13/2020   HGBA1C 6.0 (H) 06/24/2020   HGBA1C 5.9 (H) 01/08/2020   HGBA1C 6.3 (H) 08/06/2019   HGBA1C 7.2 (H) 04/03/2019   Lab Results  Component Value Date   INSULIN 11.2 04/03/2019   Lab Results  Component Value Date   TSH 0.575 04/03/2019   Lab Results  Component Value Date   CHOL 133 06/24/2020   HDL 43 06/24/2020   LDLCALC 75 06/24/2020   TRIG 75 06/24/2020   Lab Results  Component Value Date   VD25OH 48.4 11/13/2020   VD25OH 44.4 06/24/2020   VD25OH 55.2 01/08/2020   Lab Results  Component Value Date   WBC 7.2 06/24/2020   HGB 11.7 06/24/2020   HCT 35.1 06/24/2020   MCV 86 06/24/2020   PLT 357 06/24/2020   Lab Results  Component Value Date   IRON 81 06/24/2020   TIBC 252 06/24/2020   FERRITIN 64 06/24/2020   Attestation Statements:   Reviewed by clinician on day of visit: allergies, medications, problem list, medical history, surgical history, family history, social history, and previous encounter notes.   Wilhemena Durie, am acting as transcriptionist for [employer], PA-C.  I have reviewed the above documentation for accuracy and completeness, and I agree with the above. Abby Potash, PA-C

## 2021-04-10 ENCOUNTER — Ambulatory Visit
Admission: RE | Admit: 2021-04-10 | Discharge: 2021-04-10 | Disposition: A | Discharge status: Home / Self Care | Payer: BC Managed Care – PPO | Source: Ambulatory Visit | Attending: Family Medicine | Admitting: Family Medicine

## 2021-04-10 DIAGNOSIS — Z1231 Encounter for screening mammogram for malignant neoplasm of breast: Secondary | ICD-10-CM

## 2021-04-13 ENCOUNTER — Other Ambulatory Visit: Payer: Self-pay | Admitting: Family Medicine

## 2021-04-13 DIAGNOSIS — R928 Other abnormal and inconclusive findings on diagnostic imaging of breast: Secondary | ICD-10-CM

## 2021-04-14 ENCOUNTER — Encounter (INDEPENDENT_AMBULATORY_CARE_PROVIDER_SITE_OTHER): Payer: Self-pay | Admitting: Bariatrics

## 2021-04-14 ENCOUNTER — Other Ambulatory Visit: Payer: Self-pay

## 2021-04-14 ENCOUNTER — Ambulatory Visit (INDEPENDENT_AMBULATORY_CARE_PROVIDER_SITE_OTHER): Payer: BC Managed Care – PPO | Admitting: Bariatrics

## 2021-04-14 VITALS — BP 149/83 | HR 76 | Temp 98.1°F | Ht 61.0 in | Wt 181.0 lb

## 2021-04-14 DIAGNOSIS — E1159 Type 2 diabetes mellitus with other circulatory complications: Secondary | ICD-10-CM

## 2021-04-14 DIAGNOSIS — F3289 Other specified depressive episodes: Secondary | ICD-10-CM | POA: Diagnosis not present

## 2021-04-14 DIAGNOSIS — E559 Vitamin D deficiency, unspecified: Secondary | ICD-10-CM

## 2021-04-14 DIAGNOSIS — Z7984 Long term (current) use of oral hypoglycemic drugs: Secondary | ICD-10-CM

## 2021-04-14 DIAGNOSIS — Z6841 Body Mass Index (BMI) 40.0 and over, adult: Secondary | ICD-10-CM

## 2021-04-14 DIAGNOSIS — I10 Essential (primary) hypertension: Secondary | ICD-10-CM

## 2021-04-14 DIAGNOSIS — E1169 Type 2 diabetes mellitus with other specified complication: Secondary | ICD-10-CM

## 2021-04-14 DIAGNOSIS — E669 Obesity, unspecified: Secondary | ICD-10-CM

## 2021-04-14 MED ORDER — VITAMIN D (ERGOCALCIFEROL) 1.25 MG (50000 UNIT) PO CAPS: 50000.0000 [IU] | ORAL_CAPSULE | ORAL | 0 refills | Status: DC

## 2021-04-14 MED ORDER — WEGOVY 2.4 MG/0.75ML ~~LOC~~ SOAJ: 2.4000 mg | SUBCUTANEOUS | 0 refills | Status: DC

## 2021-04-14 MED ORDER — BUPROPION HCL ER (SR) 200 MG PO TB12: 200.0000 mg | ORAL_TABLET | Freq: Two times a day (BID) | ORAL | 0 refills | Status: DC

## 2021-04-14 MED ORDER — METFORMIN HCL 500 MG PO TABS: 500.0000 mg | ORAL_TABLET | Freq: Every day | ORAL | 0 refills | Status: DC

## 2021-04-14 MED ORDER — AMLODIPINE BESYLATE 5 MG PO TABS: 5.0000 mg | ORAL_TABLET | Freq: Every morning | ORAL | 0 refills | Status: DC

## 2021-04-14 NOTE — Progress Notes (Signed)
Chief Complaint:   OBESITY Katrina Beard is here to discuss her progress with her obesity treatment plan along with follow-up of her obesity related diagnoses. Katrina Beard is on practicing portion control and making smarter food choices, such as increasing vegetables and decreasing simple carbohydrates and states she is following her eating plan approximately 75% of the time. Katrina Beard states she is doing 0 minutes 0 times per week.  Today's visit was #: 15 Starting weight: 231 lbs Starting date: 04/03/2019 Today's weight: 181 lbs Today's date: 04/14/2021 Total lbs lost to date: 50 lbs Total lbs lost since last in-office visit: 0  Interim History: Katrina Beard is up 3 lbs since her last visit.  Subjective:   1. Essential hypertension Katrina Beard blood pressure is slightly elevated today. Her blood was 149/83 today.  2. Type 2 diabetes mellitus with other specified complication, without long-term current use of insulin ([facility]) Katrina Beard is taking her medications as directed.   3. Vitamin D deficiency Katrina Beard get minimal sun exposure. She is currently not on Vitamin D.  4. Other depression, with emotional eating Katrina Beard is taking her medications as directed.  Assessment/Plan:   1. Essential hypertension We will refill Norvasc 5 mg for 1 month with no refills. Katrina Beard is working on healthy weight loss and exercise to improve blood pressure control. We will watch for signs of hypotension as she continues her lifestyle modifications.  - amLODipine (NORVASC) 5 MG tablet; Take 1 tablet (5 mg total) by mouth every morning.  Dispense: 30 tablet; Refill: 0  2. Type 2 diabetes mellitus with other specified complication, without long-term current use of insulin ([facility]CC) We will refill Wegovy 2.4 mg with no refills. We will refill Metformin 500 mg for 1 month with no refills.Good blood sugar control is important to decrease the likelihood of diabetic complications such as nephropathy, neuropathy, limb loss, blindness, coronary  artery disease, and death. Intensive lifestyle modification including diet, exercise and weight loss are the first line of treatment for diabetes.   - Semaglutide-Weight Management (WEGOVY) 2.4 MG/0.75ML SOAJ; Inject 2.4 mg into the skin once a week.  Dispense: 3 mL; Refill: 0 - metFORMIN (GLUCOPHAGE) 500 MG tablet; Take 1 tablet (500 mg total) by mouth daily with breakfast.  Dispense: 30 tablet; Refill: 0  3. Vitamin D deficiency Low Vitamin D level contributes to fatigue and are associated with obesity, breast, and colon cancer. We will refill prescription Vitamin D 50,000 IU every week for 1 month with no refills and Katrina Beard will follow-up for routine testing of Vitamin D, at least 2-3 times per year to avoid over-replacement.  - Vitamin D, Ergocalciferol, (DRISDOL) 1.25 MG (50000 UNIT) CAPS capsule; Take 1 capsule (50,000 Units total) by mouth every 7 (seven) days.  Dispense: 4 capsule; Refill: 0  4. Other depression, with emotional eating Katrina Beard agrees to increase to bupropion 200 mg for 1 month with no refills. Behavior modification techniques were discussed today to help Katrina Beard deal with her emotional/non-hunger eating behaviors.  Orders and follow up as documented in patient record.    - buPROPion (WELLBUTRIN SR) 200 MG 12 hr tablet; Take 1 tablet (200 mg total) by mouth 2 (two) times daily.  Dispense: 30 tablet; Refill: 0  5. Obesity with current BMI of 34.2 Katrina Beard is currently in the action stage of change. As such, her goal is to continue with weight loss efforts. She has agreed to practicing portion control and making smarter food choices, such as increasing vegetables and decreasing simple carbohydrates.  Katrina Beard will adhere closely to her plan (calories/protein).  Exercise goals: No exercise has been prescribed at this time.  Behavioral modification strategies: increasing lean protein intake, decreasing simple carbohydrates, increasing vegetables, increasing water intake, decreasing  eating out, no skipping meals, meal planning and cooking strategies, keeping healthy foods in the home, and planning for success.  Katrina Beard has agreed to follow-up with our clinic in 3-4 weeks with Dr. Leafy Ro or Mina Marble, NP or Abby Potash PA_C. She was informed of the importance of frequent follow-up visits to maximize her success with intensive lifestyle modifications for her multiple health conditions.   Objective:   Blood pressure (!) 149/83, pulse 76, temperature 98.1 F (36.7 C), height 5\' 1"  (1.549 m), weight 181 lb (82.1 kg), SpO2 100 %. Body mass index is 34.2 kg/m.  General: Cooperative, alert, well developed, in no acute distress. HEENT: Conjunctivae and lids unremarkable. Cardiovascular: Regular rhythm.  Lungs: Normal work of breathing. Neurologic: No focal deficits.   Lab Results  Component Value Date   CREATININE 1.03 (H) 11/13/2020   BUN 13 11/13/2020   NA 139 11/13/2020   K 3.8 11/13/2020   CL 101 11/13/2020   CO2 24 11/13/2020   Lab Results  Component Value Date   ALT 19 11/13/2020   AST 16 11/13/2020   ALKPHOS 85 11/13/2020   BILITOT 0.5 11/13/2020   Lab Results  Component Value Date   HGBA1C 5.5 11/13/2020   HGBA1C 6.0 (H) 06/24/2020   HGBA1C 5.9 (H) 01/08/2020   HGBA1C 6.3 (H) 08/06/2019   HGBA1C 7.2 (H) 04/03/2019   Lab Results  Component Value Date   INSULIN 11.2 04/03/2019   Lab Results  Component Value Date   TSH 0.575 04/03/2019   Lab Results  Component Value Date   CHOL 133 06/24/2020   HDL 43 06/24/2020   LDLCALC 75 06/24/2020   TRIG 75 06/24/2020   Lab Results  Component Value Date   VD25OH 48.4 11/13/2020   VD25OH 44.4 06/24/2020   VD25OH 55.2 01/08/2020   Lab Results  Component Value Date   WBC 7.2 06/24/2020   HGB 11.7 06/24/2020   HCT 35.1 06/24/2020   MCV 86 06/24/2020   PLT 357 06/24/2020   Lab Results  Component Value Date   IRON 81 06/24/2020   TIBC 252 06/24/2020   FERRITIN 64 06/24/2020    Attestation Statements:   Reviewed by clinician on day of visit: allergies, medications, problem list, medical history, surgical history, family history, social history, and previous encounter notes.  I, Lizbeth Bark, RMA, am acting as Location manager for [employer], DO.  I have reviewed the above documentation for accuracy and completeness, and I agree with the above. Jearld Lesch, DO

## 2021-04-15 ENCOUNTER — Ambulatory Visit (INDEPENDENT_AMBULATORY_CARE_PROVIDER_SITE_OTHER): Payer: BC Managed Care – PPO | Admitting: Family Medicine

## 2021-04-15 LAB — VITAMIN B12: Vitamin B-12: >1500

## 2021-04-15 LAB — CBC AND DIFFERENTIAL
HCT: 39 (ref 36–46)
Hemoglobin: 13 (ref 12.0–16.0)
Platelets: 279 (ref 150–399)
WBC: 6.5

## 2021-04-15 LAB — IRON,TIBC AND FERRITIN PANEL: Iron: 162

## 2021-04-15 LAB — MICROALBUMIN, URINE: Microalb, Ur: <0.7

## 2021-04-15 LAB — BASIC METABOLIC PANEL
BUN: 16 (ref 4–21)
CO2: 30 — AB (ref 13–22)
Chloride: 104 (ref 99–108)
Creatinine: 1 (ref 0.5–1.1)
Glucose: 71
Potassium: 3.6 (ref 3.4–5.3)
Sodium: 140 (ref 137–147)

## 2021-04-15 LAB — COMPREHENSIVE METABOLIC PANEL
Albumin: 4 (ref 3.5–5.0)
Calcium: 9 (ref 8.7–10.7)

## 2021-04-15 LAB — HEPATIC FUNCTION PANEL
AST: 48 — AB (ref 13–35)
Alkaline Phosphatase: 92 (ref 25–125)
Bilirubin, Total: 0.6

## 2021-04-15 LAB — CBC: RBC: 4.29 (ref 3.87–5.11)

## 2021-04-15 LAB — HEMOGLOBIN A1C: Hemoglobin A1C: 5.5

## 2021-04-15 LAB — LIPID PANEL
Cholesterol: 161 (ref 0–200)
HDL: 55 (ref 35–70)
LDL Cholesterol: 91
Triglycerides: 80 (ref 40–160)

## 2021-05-04 ENCOUNTER — Other Ambulatory Visit (INDEPENDENT_AMBULATORY_CARE_PROVIDER_SITE_OTHER): Payer: Self-pay

## 2021-05-06 ENCOUNTER — Ambulatory Visit
Admission: RE | Admit: 2021-05-06 | Discharge: 2021-05-06 | Disposition: A | Discharge status: Home / Self Care | Payer: BC Managed Care – PPO | Source: Ambulatory Visit | Attending: Family Medicine | Admitting: Family Medicine

## 2021-05-06 ENCOUNTER — Other Ambulatory Visit: Payer: Self-pay | Admitting: Family Medicine

## 2021-05-06 DIAGNOSIS — R928 Other abnormal and inconclusive findings on diagnostic imaging of breast: Secondary | ICD-10-CM

## 2021-05-06 DIAGNOSIS — R921 Mammographic calcification found on diagnostic imaging of breast: Secondary | ICD-10-CM

## 2021-05-14 ENCOUNTER — Ambulatory Visit (INDEPENDENT_AMBULATORY_CARE_PROVIDER_SITE_OTHER): Payer: BC Managed Care – PPO | Admitting: Physician Assistant

## 2021-05-14 ENCOUNTER — Other Ambulatory Visit: Payer: Self-pay

## 2021-05-14 ENCOUNTER — Encounter (INDEPENDENT_AMBULATORY_CARE_PROVIDER_SITE_OTHER): Payer: Self-pay | Admitting: Physician Assistant

## 2021-05-14 VITALS — BP 143/84 | HR 82 | Temp 98.1°F | Ht 61.0 in | Wt 178.0 lb

## 2021-05-14 DIAGNOSIS — F3289 Other specified depressive episodes: Secondary | ICD-10-CM | POA: Diagnosis not present

## 2021-05-14 DIAGNOSIS — Z6833 Body mass index (BMI) 33.0-33.9, adult: Secondary | ICD-10-CM

## 2021-05-14 DIAGNOSIS — E559 Vitamin D deficiency, unspecified: Secondary | ICD-10-CM | POA: Diagnosis not present

## 2021-05-14 DIAGNOSIS — I10 Essential (primary) hypertension: Secondary | ICD-10-CM | POA: Diagnosis not present

## 2021-05-14 DIAGNOSIS — E1169 Type 2 diabetes mellitus with other specified complication: Secondary | ICD-10-CM | POA: Diagnosis not present

## 2021-05-14 DIAGNOSIS — Z9189 Other specified personal risk factors, not elsewhere classified: Secondary | ICD-10-CM

## 2021-05-14 DIAGNOSIS — E669 Obesity, unspecified: Secondary | ICD-10-CM

## 2021-05-14 DIAGNOSIS — Z7984 Long term (current) use of oral hypoglycemic drugs: Secondary | ICD-10-CM

## 2021-05-14 MED ORDER — VITAMIN D (ERGOCALCIFEROL) 1.25 MG (50000 UNIT) PO CAPS: 50000.0000 [IU] | ORAL_CAPSULE | ORAL | 0 refills | Status: DC

## 2021-05-14 MED ORDER — WEGOVY 2.4 MG/0.75ML ~~LOC~~ SOAJ: 2.4000 mg | SUBCUTANEOUS | 0 refills | Status: DC

## 2021-05-14 MED ORDER — BUPROPION HCL ER (SR) 200 MG PO TB12: 200.0000 mg | ORAL_TABLET | Freq: Two times a day (BID) | ORAL | 0 refills | Status: DC

## 2021-05-14 MED ORDER — AMLODIPINE BESYLATE 10 MG PO TABS: 10.0000 mg | ORAL_TABLET | Freq: Every day | ORAL | 0 refills | Status: DC

## 2021-05-14 NOTE — Progress Notes (Signed)
? ? ? ?Chief Complaint:  ? ?OBESITY ?Katrina Beard is here to discuss her progress with her obesity treatment plan along with follow-up of her obesity related diagnoses. Katrina Beard is on practicing portion control and making smarter food choices, such as increasing vegetables and decreasing simple carbohydrates and states she is following her eating plan approximately 60% of the time. Katrina Beard states she is doing 0 minutes 0 times per week. ? ?Today's visit was #: 36 ?Starting weight: 231 lbs ?Starting date: 04/03/2019 ?Today's weight: 178 lbs ?Today's date: 05/14/2021 ?Total lbs lost to date: 53 lbs ?Total lbs lost since last in-office visit: 3 lbs ? ?Interim History: Katrina Beard reports that the last few weeks has been "rough". She has been eating baked chicken, sometimes fried chicken, eggs, chicken with salad at lunch and drinking protein shakes for breakfast.  ? ?Subjective:  ? ?1. Essential hypertension ?Katrina Beard is currently on amlodipine, valsartan and Cozaar. Her blood pressure is slightly elevated today at 149/83. Her blood pressure at home ranges 140's/80's. She denies chest pain or headache.  ? ?2. Vitamin D deficiency ?Katrina Beard is taking Vitamin D weekly as prescribed.  ? ?3. Type 2 diabetes mellitus with other specified complication, without long-term current use of insulin (Katrina Beard) ?Katrina Beard's last A1C was 5.5. She is on Metformin and tolerating it well. After discussion she is okay with discontinuing Metformin  ? ?4. At risk for heart disease ?Katrina Beard is at higher than average risk for cardiovascular disease due to obesity.  ? ?5. Other depression, with emotional eating ?Katrina Beard states Wellbutrin helps with cravings.  ? ?Assessment/Plan:  ? ?1. Essential hypertension ?We will refill Norvasc 10 mg for 1 month with no refills. Katrina Beard is working on healthy weight loss and exercise to improve blood pressure control. We will watch for signs of hypotension as she continues her lifestyle modifications. ? ?- amLODipine (NORVASC) 10 MG tablet; Take  1 tablet (10 mg total) by mouth daily.  Dispense: 30 tablet; Refill: 0 ? ?2. Vitamin D deficiency ?Low Vitamin D level contributes to fatigue and are associated with obesity, breast, and colon cancer. We will refill prescription Vitamin D 50,000 IU every week for 1 month with no refills and Katrina Beard will follow-up for routine testing of Vitamin D, at least 2-3 times per year to avoid over-replacement. ? ?- Vitamin D, Ergocalciferol, (DRISDOL) 1.25 MG (50000 UNIT) CAPS capsule; Take 1 capsule (50,000 Units total) by mouth every 7 (seven) days.  Dispense: 4 capsule; Refill: 0 ? ?3. Type 2 diabetes mellitus with other specified complication, without long-term current use of insulin (Katrina Beard) ?Katrina Beard will discontinue Metformin. Katrina Beard agrees to start Wegovy 2.4 mg. We will refill Wegovy 2.4 mg for 1 month with no refills. Good blood sugar control is important to decrease the likelihood of diabetic complications such as nephropathy, neuropathy, limb loss, blindness, coronary artery disease, and death. Intensive lifestyle modification including diet, exercise and weight loss are the first line of treatment for diabetes.  ? ?- Semaglutide-Weight Management (WEGOVY) 2.4 MG/0.75ML SOAJ; Inject 2.4 mg into the skin once a week.  Dispense: 3 mL; Refill: 0 ? ?4. At risk for heart disease ?Katrina Beard was given approximately 15 minutes of coronary artery disease prevention counseling today. She is 61 y.o. female and has risk factors for heart disease including obesity. We discussed intensive lifestyle modifications today with an emphasis on specific weight loss instructions and strategies. ? ?Repetitive spaced learning was employed today to elicit superior memory formation and behavioral change.   ? ?5. Other depression,  with emotional eating ?We will refill Wellbutrin SR 200 mg twice daily for 1 month with no refills. Behavior modification techniques were discussed today to help Katrina Beard deal with her emotional/non-hunger eating behaviors.   Orders and follow up as documented in patient record.  ? ?- buPROPion (WELLBUTRIN SR) 200 MG 12 hr tablet; Take 1 tablet (200 mg total) by mouth 2 (two) times daily.  Dispense: 30 tablet; Refill: 0 ? ?6. Obesity with current BMI of 33.65 ?Katrina Beard is currently in the action stage of change. As such, her goal is to continue with weight loss efforts. She has agreed to practicing portion control and making smarter food choices, such as increasing vegetables and decreasing simple carbohydrates.  ? ?We will refill Wegovy 2.4 mg for 1 month with no refills.  ? ?Exercise goals: No exercise has been prescribed at this time. ? ?Behavioral modification strategies: decreasing eating out and meal planning and cooking strategies. ? ?Katrina Beard has agreed to follow-up with our clinic in 4 weeks. She was informed of the importance of frequent follow-up visits to maximize her success with intensive lifestyle modifications for her multiple health conditions.  ? ?Objective:  ? ?Blood pressure (!) 143/84, pulse 82, temperature 98.1 ?F (36.7 ?C), height 5\' 1"  (1.549 m), weight 178 lb (80.7 kg), SpO2 97 %. ?Body mass index is 33.63 kg/m?. ? ?General: Cooperative, alert, well developed, in no acute distress. ?HEENT: Conjunctivae and lids unremarkable. ?Cardiovascular: Regular rhythm.  ?Lungs: Normal work of breathing. ?Neurologic: No focal deficits.  ? ?Lab Results  ?Component Value Date  ? CREATININE 1.0 04/15/2021  ? BUN 16 04/15/2021  ? NA 140 04/15/2021  ? K 3.6 04/15/2021  ? CL 104 04/15/2021  ? CO2 30 (A) 04/15/2021  ? ?Lab Results  ?Component Value Date  ? ALT 19 11/13/2020  ? AST 48 (A) 04/15/2021  ? ALKPHOS 92 04/15/2021  ? BILITOT 0.5 11/13/2020  ? ?Lab Results  ?Component Value Date  ? HGBA1C 5.5 04/15/2021  ? HGBA1C 5.5 11/13/2020  ? HGBA1C 6.0 (H) 06/24/2020  ? HGBA1C 5.9 (H) 01/08/2020  ? HGBA1C 6.3 (H) 08/06/2019  ? ?Lab Results  ?Component Value Date  ? INSULIN 11.2 04/03/2019  ? ?Lab Results  ?Component Value Date  ? TSH 0.575  04/03/2019  ? ?Lab Results  ?Component Value Date  ? CHOL 161 04/15/2021  ? HDL 55 04/15/2021  ? [place] 91 04/15/2021  ? TRIG 80 04/15/2021  ? ?Lab Results  ?Component Value Date  ? VD25OH 48.4 11/13/2020  ? VD25OH 44.4 06/24/2020  ? VD25OH 55.2 01/08/2020  ? ?Lab Results  ?Component Value Date  ? WBC 6.5 04/15/2021  ? HGB 13.0 04/15/2021  ? HCT 39 04/15/2021  ? MCV 86 06/24/2020  ? PLT 279 04/15/2021  ? ?Lab Results  ?Component Value Date  ? IRON 162 04/15/2021  ? TIBC 252 06/24/2020  ? FERRITIN 64 06/24/2020  ? ?Attestation Statements:  ? ?Reviewed by clinician on day of visit: allergies, medications, problem list, medical history, surgical history, family history, social history, and previous encounter notes. ? ?I, Tonye Pearson, am acting as Location manager for [employer], PA-C. ? ?I have reviewed the above documentation for accuracy and completeness, and I agree with the above. Abby Potash, PA-C ? ?

## 2021-05-15 ENCOUNTER — Ambulatory Visit
Admission: RE | Admit: 2021-05-15 | Discharge: 2021-05-15 | Disposition: A | Discharge status: Home / Self Care | Payer: BC Managed Care – PPO | Source: Ambulatory Visit | Attending: Family Medicine | Admitting: Family Medicine

## 2021-05-15 DIAGNOSIS — R921 Mammographic calcification found on diagnostic imaging of breast: Secondary | ICD-10-CM

## 2021-05-15 HISTORY — PX: BREAST BIOPSY: SHX20

## 2021-05-25 ENCOUNTER — Ambulatory Visit: Payer: Self-pay | Admitting: Surgery

## 2021-05-25 DIAGNOSIS — D0512 Intraductal carcinoma in situ of left breast: Secondary | ICD-10-CM

## 2021-05-26 ENCOUNTER — Other Ambulatory Visit: Payer: Self-pay | Admitting: Surgery

## 2021-05-26 DIAGNOSIS — D0512 Intraductal carcinoma in situ of left breast: Secondary | ICD-10-CM

## 2021-05-28 ENCOUNTER — Encounter: Payer: Self-pay | Admitting: *Deleted

## 2021-06-01 ENCOUNTER — Encounter: Payer: Self-pay | Admitting: Licensed Clinical Social Worker

## 2021-06-01 ENCOUNTER — Inpatient Hospital Stay (HOSPITAL_BASED_OUTPATIENT_CLINIC_OR_DEPARTMENT_OTHER): Payer: BC Managed Care – PPO | Admitting: Licensed Clinical Social Worker

## 2021-06-01 ENCOUNTER — Inpatient Hospital Stay: Payer: BC Managed Care – PPO | Attending: Hematology and Oncology

## 2021-06-01 ENCOUNTER — Other Ambulatory Visit: Payer: Self-pay | Admitting: Licensed Clinical Social Worker

## 2021-06-01 ENCOUNTER — Other Ambulatory Visit: Payer: Self-pay

## 2021-06-01 DIAGNOSIS — D0512 Intraductal carcinoma in situ of left breast: Secondary | ICD-10-CM | POA: Diagnosis not present

## 2021-06-01 DIAGNOSIS — E119 Type 2 diabetes mellitus without complications: Secondary | ICD-10-CM | POA: Insufficient documentation

## 2021-06-01 DIAGNOSIS — Z79810 Long term (current) use of selective estrogen receptor modulators (SERMs): Secondary | ICD-10-CM | POA: Insufficient documentation

## 2021-06-01 DIAGNOSIS — Z803 Family history of malignant neoplasm of breast: Secondary | ICD-10-CM | POA: Diagnosis not present

## 2021-06-01 DIAGNOSIS — Z79899 Other long term (current) drug therapy: Secondary | ICD-10-CM | POA: Insufficient documentation

## 2021-06-01 DIAGNOSIS — Z9884 Bariatric surgery status: Secondary | ICD-10-CM | POA: Insufficient documentation

## 2021-06-01 LAB — GENETIC SCREENING ORDER

## 2021-06-01 NOTE — Progress Notes (Addendum)
New Breast Cancer Diagnosis: Left Breast UOQ ? ?Did patient present with symptoms (if so, please note symptoms) or screening mammography?:Screening Calcifications  noted in the left breast upper outer quadrant measuring over 12 cm.  ? ?Location and Extent of disease :left breast. Located in the upper outer quadrant, measured 0.2 cm in greatest dimension. Adenopathy no. ? 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 ?Receptor Status: ER(positive), PR (positive), Her2-neu (), Ki-(%) ? ? ?Surgeon and surgical plan, if any:  ?Dr. Brantley Stage ?-Left Breast Lumpectomy with radioactive seed localization 06/11/2021 ? ? ?Medical oncologist, treatment if any:   ?Dr. Chryl Heck 06/02/2021 ? ? ? ?Family History of Breast/Ovarian/Prostate Cancer: Mom had breast cancer in her 75's. ? ?Lymphedema issues, if any: No     ? ?Pain issues, if any: No    ? ?SAFETY ISSUES: ?Prior radiation? No ?Pacemaker/ICD? No ?Possible current pregnancy? Perimenopausal ?Is the patient on methotrexate? No ? ?Current Complaints / other details:   ?-She has had previous lumpectomy for benign disease in the left side. ? ?-Genetics 06/01/2021 ?

## 2021-06-01 NOTE — Progress Notes (Signed)
REFERRING PROVIDER: ?Erroll Luna, MD ?[address] ?[place],  [place] [address] ? ?PRIMARY PROVIDER:  ?Jonathon Jordan, MD ? ?PRIMARY REASON FOR VISIT:  ?1. Ductal carcinoma in situ (DCIS) of left breast   ?2. Family history of breast cancer   ? ? ? ?HISTORY OF PRESENT ILLNESS:   ?Katrina Beard, a 61 y.o. female, was seen for a [place] cancer genetics consultation at the request of Dr. Brantley Stage due to a personal and family history of cancer.  Katrina Beard presents to clinic today to discuss the possibility of a hereditary predisposition to cancer, genetic testing, and to further clarify her future cancer risks, as well as potential cancer risks for family members.  ? ?In 2023, at the age of 18, Katrina Beard was diagnosed with DCIS of the left breast. The treatment plan includes lumpectomy which is scheduled for 06/11/2021, patient meets with radiation oncology and medical oncology 06/02/2021. ? ?  ? ?CANCER HISTORY:  ?Oncology History  ? No history exists.  ? ? ? ?RISK FACTORS:  ?Menarche was at age 76.  ?First live birth at age 13.  ?OCP use for approximately 6 years.  ?Ovaries intact: yes.  ?Hysterectomy: no.  ?Menopausal status: pt unsure ?HRT use: 0 years. ?Colonoscopy: yes; normal. ?Mammogram within the last year: yes. ?Number of breast biopsies: 1; has had other benign breast surgeries ?Up to date with pelvic exams: yes. ? ?Past Medical History:  ?Diagnosis Date  ? Asthma   ? CHF (congestive heart failure) ([place])   ? Diabetes mellitus   ? Gallbladder problem   ? HLD (hyperlipidemia)   ? Hypertension   ? Joint pain   ? Knee pain   ? MI, old 2003  ? no stent placement  ? ? ?Past Surgical History:  ?Procedure Laterality Date  ? BREAST EXCISIONAL BIOPSY Bilateral   ? BREAST SURGERY    ? CHOLECYSTECTOMY    ? CYSTOSCOPY W/ URETERAL STENT PLACEMENT Right 07/25/2016  ? Procedure: CYSTOSCOPY WITH RETROGRADE PYELOGRAM/LEFT URETERAL STENT PLACEMENT;  Surgeon: Ardis Hughs, MD;  Location: [facility];  Service:  Urology;  Laterality: Right;  ? HAND SURGERY    ? ROUX-EN-Y GASTRIC BYPASS    ? 07/15/2014  ? ? ?Social History  ? ?Socioeconomic History  ? Marital status: Single  ?  Spouse name: Not on file  ? Number of children: Not on file  ? Years of education: Not on file  ? Highest education level: Not on file  ?Occupational History  ? Occupation: Teacher, early years/pre  ?Tobacco Use  ? Smoking status: Never  ? Smokeless tobacco: Never  ?Vaping Use  ? Vaping Use: Never used  ?Substance and Sexual Activity  ? Alcohol use: No  ? Drug use: No  ? Sexual activity: Not on file  ?Other Topics Concern  ? Not on file  ?Social History Narrative  ? Not on file  ? ?Social Determinants of Health  ? ?Financial Resource Strain: Not on file  ?Food Insecurity: Not on file  ?Transportation Needs: Not on file  ?Physical Activity: Not on file  ?Stress: Not on file  ?Social Connections: Not on file  ?  ? ?FAMILY HISTORY:  ?We obtained a detailed, 4-generation family history.  Significant diagnoses are listed below: ?Family History  ?Problem Relation Age of Onset  ? Breast cancer Mother 83  ? Hypertension Mother   ? Hyperlipidemia Mother   ? Obesity Mother   ? Hypertension Father   ? Heart disease Father   ? ?  Katrina Beard has 1 daughter, Katrina Beard, 36, no history of cancer. She has 3 maternal half brothers, no cancers.  ? ?Katrina Beard's mother had breast cancer at 63 and died at 53. Patient had 1 maternal uncle who died in his 51s. No known cancers in maternal cousins or grandparents. Grandparents passed around age 5. ? ?Katrina Beard's father died at 22. He was an only child. Patient has no information about paternal grandparents. ? ?Katrina Beard is unaware of previous family history of genetic testing for hereditary cancer risks. There is no reported Ashkenazi Jewish ancestry. There is no known consanguinity. ? ? ? ?GENETIC COUNSELING ASSESSMENT: Katrina Beard is a 61 y.o. female with a personal and family history of breast cancer which is somewhat suggestive of a  hereditary cancer syndrome and predisposition to cancer. We, therefore, discussed and recommended the following at today's visit.  ? ?DISCUSSION: We discussed that approximately 10% of breast cancer is hereditary. Most cases of hereditary breast cancer are associated with BRCA1/BRCA2 genes, although there are other genes associated with hereditary cancer as well. Cancers and risks are gene specific.  We discussed that testing is beneficial for several reasons including surgical decision-making for breast cancer, knowing about other cancer risks, identifying potential screening and risk-reduction options that may be appropriate, and to understand if other family members could be at risk for cancer and allow them to undergo genetic testing.  ? ?We reviewed the characteristics, features and inheritance patterns of hereditary cancer syndromes. We also discussed genetic testing, including the appropriate family members to test, the process of testing, insurance coverage and turn-around-time for results. We discussed the implications of a negative, positive and/or variant of uncertain significant result. In order to get genetic test results in a timely manner so that Katrina Beard can use these genetic test results for surgical decisions, we recommended Katrina Beard pursue genetic testing for the [facility] STAT panel. Once complete, we recommend Katrina Beard pursue reflex genetic testing to the [facility] CancerNext-Expanded+RNA gene panel.  ? ?Based on Katrina Beard's personal and family history of cancer, she meets medical criteria for genetic testing. Despite that she meets criteria, she may still have an out of pocket cost.  ? ?PLAN: After considering the risks, benefits, and limitations, Katrina Beard provided informed consent to pursue genetic testing and the blood sample was sent to [facility] for analysis of the CancerNext-Expanded+RNA panel. Initial results should be available within approximately 5-12 days' time, at which  point they will be disclosed by telephone to Katrina Beard, as will any additional recommendations warranted by these results. Katrina Beard will receive a summary of her genetic counseling visit and a copy of her results once available. This information will also be available in Epic.  ? ?Katrina Beard's questions were answered to her satisfaction today. Our contact information was provided should additional questions or concerns arise. Thank you for the referral and allowing [place] to share in the care of your patient.  ? ?Faith Rogue, MS, LCGC ?Genetic Counselor ?[email or web address] ?Phone: [phone] ? ?The patient was seen for a total of 25 minutes in face-to-face genetic counseling.  Patient was seen with her daughter. Dr. Grayland Ormond was available for discussion regarding this case.  ? ?_______________________________________________________________________ ?For Office Staff:  ?Number of people involved in session: 2 ?Was an Intern/ student involved with case: no ? ?

## 2021-06-01 NOTE — Progress Notes (Signed)
?Radiation Oncology         [phone] ?________________________________ ? ?Name: Katrina Beard        MRN: [id number]  ?Date of Service: 06/02/2021 DOB: [birth year] ? ?KC:[id number], Ivin Booty, MD  Erroll Luna, MD    ? ?REFERRING PHYSICIAN: Erroll Luna, MD ? ? ?DIAGNOSIS: There were no encounter diagnoses. ? ? ?HISTORY OF PRESENT ILLNESS: Katrina Beard is a 61 y.o. female seen at the request of Dr. Brantley Stage for a new diagnosis of Left breast cancer.  The patient was found on screening mammogram to have calcifications in the upper outer quadrant.  By diagnostic imaging, these were noted to span 12 cm.  She underwent biopsy on 05/15/2021 of the anterior and posterior aspects of the calcifications and this showed both intermediate grade DCIS with associated calcifications, her tumor was ER/PR positive.  She is planning to undergo left lumpectomy x2 and is seen to discuss adjuvant radiotherapy. ? ? ?PREVIOUS RADIATION THERAPY: No ? ? ?PAST MEDICAL HISTORY:  ?Past Medical History:  ?Diagnosis Date  ? Asthma   ? CHF (congestive heart failure) ([place])   ? Diabetes mellitus   ? Gallbladder problem   ? HLD (hyperlipidemia)   ? Hypertension   ? Joint pain   ? Knee pain   ? MI, old 2003  ? no stent placement  ?   ? ? ?PAST SURGICAL HISTORY: ?Past Surgical History:  ?Procedure Laterality Date  ? BREAST EXCISIONAL BIOPSY Bilateral   ? BREAST SURGERY    ? CHOLECYSTECTOMY    ? CYSTOSCOPY W/ URETERAL STENT PLACEMENT Right 07/25/2016  ? Procedure: CYSTOSCOPY WITH RETROGRADE PYELOGRAM/LEFT URETERAL STENT PLACEMENT;  Surgeon: Ardis Hughs, MD;  Location: [facility];  Service: Urology;  Laterality: Right;  ? HAND SURGERY    ? ROUX-EN-Y GASTRIC BYPASS    ? 07/15/2014  ? ? ? ?FAMILY HISTORY:  ?Family History  ?Problem Relation Age of Onset  ? Breast cancer Mother 4  ? Hypertension Mother   ? Hyperlipidemia Mother   ? Obesity Mother   ? Hypertension Father   ? Heart disease Father   ? ? ? ?SOCIAL HISTORY:  reports that she has never  smoked. She has never used smokeless tobacco. She reports that she does not drink alcohol and does not use drugs.  The patient is single and lives in [place].  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 She is an avid reader.  ? ? ?ALLERGIES: Ibuprofen and Lisinopril-hydrochlorothiazide ? ? ?MEDICATIONS:  ?Current Outpatient Medications  ?Medication Sig Dispense Refill  ? amLODipine (NORVASC) 10 MG tablet Take 1 tablet (10 mg total) by mouth daily. 30 tablet 0  ? aspirin EC 81 MG tablet Take 81 mg by mouth daily.    ? atorvastatin (LIPITOR) 20 MG tablet Take 20 mg by mouth daily.    ? blood glucose meter kit and supplies KIT Dispense based on patient and insurance preference. Use up to four times daily as directed. 1 each 0  ? buPROPion (WELLBUTRIN SR) 200 MG 12 hr tablet Take 1 tablet (200 mg total) by mouth 2 (two) times daily. 30 tablet 0  ? cetirizine (ZYRTEC ALLERGY) 10 MG tablet Take 1 tablet (10 mg total) by mouth daily. 30 tablet 2  ? ferrous sulfate (FERROUSUL) 325 (65 FE) MG tablet Take 1 tablet (325 mg total) by mouth daily with breakfast. 30 tablet 0  ? fluticasone (FLONASE) 50 MCG/ACT nasal spray Place 1 spray into both nostrils in the  morning and at bedtime. 16 g 2  ? losartan (COZAAR) 100 MG tablet Take 100 mg by mouth daily.    ? metroNIDAZOLE (FLAGYL) 500 MG tablet Take 1 tablet (500 mg total) by mouth 2 (two) times daily. 14 tablet 0  ? ondansetron (ZOFRAN ODT) 4 MG disintegrating tablet Take 1 tablet (4 mg total) by mouth every 8 (eight) hours as needed for nausea or vomiting. 20 tablet 0  ? predniSONE (DELTASONE) 20 MG tablet Take 2 tablets (40 mg total) by mouth daily with breakfast. 10 tablet 0  ? Semaglutide-Weight Management (WEGOVY) 2.4 MG/0.75ML SOAJ Inject 2.4 mg into the skin once a week. 3 mL 0  ? valsartan (DIOVAN) 320 MG tablet Take 320 mg by mouth daily.    ? Vitamin D, Ergocalciferol, (DRISDOL) 1.25 MG (50000 UNIT) CAPS capsule Take 1 capsule (50,000 Units total) by  mouth every 7 (seven) days. 4 capsule 0  ? ?No current facility-administered medications for this visit.  ? ? ? ?REVIEW OF SYSTEMS: On review of systems, the patient reports that she is doing well. No specific breast complaints are verbalized.  ? ?  ? ?PHYSICAL EXAM:  ?Wt Readings from Last 3 Encounters:  ?05/14/21 178 lb (80.7 kg)  ?04/14/21 181 lb (82.1 kg)  ?03/25/21 178 lb (80.7 kg)  ? ?Temp Readings from Last 3 Encounters:  ?05/14/21 98.1 ?F (36.7 ?C)  ?04/14/21 98.1 ?F (36.7 ?C)  ?03/25/21 97.9 ?F (36.6 ?C) (Oral)  ? ?BP Readings from Last 3 Encounters:  ?05/14/21 (!) 143/84  ?04/14/21 (!) 149/83  ?03/25/21 (!) 160/85  ? ?Pulse Readings from Last 3 Encounters:  ?05/14/21 82  ?04/14/21 76  ?03/25/21 74  ? ? ?In general this is a well appearing African-American female in no acute distress. She's alert and oriented x4 and appropriate throughout the examination. Cardiopulmonary assessment is negative for acute distress and she exhibits normal effort. Bilateral breast exam is deferred. ? ? ? ?ECOG = 0 ? ?0 - Asymptomatic (Fully active, able to carry on all predisease activities without restriction) ? ?1 - Symptomatic but completely ambulatory (Restricted in physically strenuous activity but ambulatory and able to carry out work of a light or sedentary nature. For example, light housework, office work) ? ?2 - Symptomatic, <50% in bed during the day (Ambulatory and capable of all self care but unable to carry out any work activities. Up and about more than 50% of waking hours) ? ?3 - Symptomatic, >50% in bed, but not bedbound (Capable of only limited self-care, confined to bed or chair 50% or more of waking hours) ? ?4 - Bedbound (Completely disabled. Cannot carry on any self-care. Totally confined to bed or chair) ? ?5 - Death ? ? Oken MM, Creech RH, Tormey DC, et al. [phone]). "Toxicity and response criteria of the [facility]". [place] Oncol. 5 (6): 649-55 ? ? ? ?LABORATORY DATA:  ?Lab  Results  ?Component Value Date  ? WBC 6.5 04/15/2021  ? HGB 13.0 04/15/2021  ? HCT 39 04/15/2021  ? MCV 86 06/24/2020  ? PLT 279 04/15/2021  ? ?Lab Results  ?Component Value Date  ? NA 140 04/15/2021  ? K 3.6 04/15/2021  ? CL 104 04/15/2021  ? CO2 30 (A) 04/15/2021  ? ?Lab Results  ?Component Value Date  ? ALT 19 11/13/2020  ? AST 48 (A) 04/15/2021  ? ALKPHOS 92 04/15/2021  ? BILITOT 0.5 11/13/2020  ? ?  ? ?RADIOGRAPHY: MM Digital Diagnostic Unilat L ? ?  Result Date: 05/06/2021 ?CLINICAL DATA:  Patient returns after screening study for evaluation of LEFT breast calcifications. EXAM: DIGITAL DIAGNOSTIC UNILATERAL LEFT MAMMOGRAM WITH CAD TECHNIQUE: Left digital diagnostic mammography was performed. Mammographic images were processed with CAD. COMPARISON:  Previous exam(s). ACR Breast Density Category b: There are scattered areas of fibroglandular density. FINDINGS: Magnified views are performed of calcifications in the UPPER-OUTER QUADRANT of the LEFT breast. These views demonstrate regional faint pleomorphic calcifications. The area of calcifications spans 12.0 x 6.4 x 6.3 centimeters. No associated mass or distortion. IMPRESSION: Indeterminate regional calcifications involving the UPPER OUTER QUADRANT of the LEFT breast, spanning at least 12 centimeters. Recommend 2 site stereotactic biopsy of calcifications to exclude malignancy and to determine extent if biopsy is positive. RECOMMENDATION: Two sites stereotactic biopsy of LEFT breast calcifications. I have discussed the findings and recommendations with the patient. If applicable, a reminder letter will be sent to the patient regarding the next appointment. BI-RADS CATEGORY  4: Suspicious. Electronically Signed   By: Nolon Nations M.D.   On: 05/06/2021 12:11 ? ?MM CLIP PLACEMENT LEFT ? ?Result Date: 05/15/2021 ?CLINICAL DATA:  Post procedure mammogram for clip placement. EXAM: 3D DIAGNOSTIC LEFT MAMMOGRAM POST STEREOTACTIC BIOPSY COMPARISON:  Previous exam(s).  FINDINGS: 3D Mammographic images were obtained following stereotactic guided biopsy of calcifications in the upper outer posterior left breast. The X biopsy marking clip is in expected position at the site of biop

## 2021-06-02 ENCOUNTER — Inpatient Hospital Stay (HOSPITAL_BASED_OUTPATIENT_CLINIC_OR_DEPARTMENT_OTHER): Payer: BC Managed Care – PPO | Admitting: Hematology and Oncology

## 2021-06-02 ENCOUNTER — Other Ambulatory Visit: Payer: Self-pay

## 2021-06-02 ENCOUNTER — Inpatient Hospital Stay: Payer: BC Managed Care – PPO

## 2021-06-02 ENCOUNTER — Ambulatory Visit
Admission: RE | Admit: 2021-06-02 | Discharge: 2021-06-02 | Disposition: A | Discharge status: Home / Self Care | Payer: BC Managed Care – PPO | Source: Ambulatory Visit | Attending: Radiation Oncology | Admitting: Radiation Oncology

## 2021-06-02 ENCOUNTER — Encounter: Payer: Self-pay | Admitting: Radiation Oncology

## 2021-06-02 ENCOUNTER — Encounter: Payer: Self-pay | Admitting: Emergency Medicine

## 2021-06-02 ENCOUNTER — Encounter: Payer: Self-pay | Admitting: Hematology and Oncology

## 2021-06-02 ENCOUNTER — Encounter (HOSPITAL_BASED_OUTPATIENT_CLINIC_OR_DEPARTMENT_OTHER): Payer: Self-pay | Admitting: Surgery

## 2021-06-02 VITALS — BP 133/72 | HR 86 | Temp 98.0°F | Resp 20 | Ht 62.0 in | Wt 179.6 lb

## 2021-06-02 DIAGNOSIS — C50412 Malignant neoplasm of upper-outer quadrant of left female breast: Secondary | ICD-10-CM

## 2021-06-02 DIAGNOSIS — I10 Essential (primary) hypertension: Secondary | ICD-10-CM | POA: Insufficient documentation

## 2021-06-02 DIAGNOSIS — J45909 Unspecified asthma, uncomplicated: Secondary | ICD-10-CM | POA: Diagnosis not present

## 2021-06-02 DIAGNOSIS — E785 Hyperlipidemia, unspecified: Secondary | ICD-10-CM | POA: Insufficient documentation

## 2021-06-02 DIAGNOSIS — Z9884 Bariatric surgery status: Secondary | ICD-10-CM | POA: Diagnosis not present

## 2021-06-02 DIAGNOSIS — Z7982 Long term (current) use of aspirin: Secondary | ICD-10-CM | POA: Insufficient documentation

## 2021-06-02 DIAGNOSIS — Z170 Estrogen receptor positive status [ER+]: Secondary | ICD-10-CM | POA: Insufficient documentation

## 2021-06-02 DIAGNOSIS — D0512 Intraductal carcinoma in situ of left breast: Secondary | ICD-10-CM

## 2021-06-02 DIAGNOSIS — Z79810 Long term (current) use of selective estrogen receptor modulators (SERMs): Secondary | ICD-10-CM | POA: Diagnosis not present

## 2021-06-02 DIAGNOSIS — Z803 Family history of malignant neoplasm of breast: Secondary | ICD-10-CM | POA: Insufficient documentation

## 2021-06-02 DIAGNOSIS — E119 Type 2 diabetes mellitus without complications: Secondary | ICD-10-CM | POA: Diagnosis not present

## 2021-06-02 DIAGNOSIS — I509 Heart failure, unspecified: Secondary | ICD-10-CM | POA: Insufficient documentation

## 2021-06-02 DIAGNOSIS — I110 Hypertensive heart disease with heart failure: Secondary | ICD-10-CM | POA: Diagnosis not present

## 2021-06-02 DIAGNOSIS — Z79899 Other long term (current) drug therapy: Secondary | ICD-10-CM | POA: Diagnosis not present

## 2021-06-02 LAB — RESEARCH LABS

## 2021-06-02 NOTE — Addendum Note (Signed)
Encounter addended by: Cori Razor, RN on: 06/02/2021 10:14 AM ? Actions taken: Flowsheet accepted

## 2021-06-02 NOTE — Progress Notes (Signed)
Katrina Beard ?CONSULT NOTE ? ?Patient Care Team: ?Jonathon Jordan, MD as PCP - General (Family Medicine) ?Mauro Kaufmann, RN as Oncology Nurse Navigator ?Rockwell Germany, RN as Oncology Nurse Navigator ? ?CHIEF COMPLAINTS/PURPOSE OF CONSULTATION:  ?DCIS left breast ? ?ASSESSMENT & PLAN:  ?Malignant neoplasm of upper-outer quadrant of left breast in female, estrogen receptor positive ([place]) ?This is a very pleasant 61 year old female patient with past medical history significant for coronary artery disease, diabetes, obesity referred to hematology and medical oncology for new diagnosis of DCIS left breast.  We have discussed the following findings today with the patient ? ? ?Pathology review: I discussed with the patient the difference between DCIS and invasive breast cancer. It is considered a precancerous lesion. DCIS is classified as a Stage 0 breast cancer. It is generally detected through mammograms as calcifications. We discussed the significance of grades and its impact on prognosis. We also discussed the importance of ER and PR receptors and their implications to adjuvant treatment options. Prognosis of DCIS dependence on grade and degree of comedo necrosis. It is anticipated that if not treated, 20-30% of DCIS can develop into invasive breast cancer. ? ?Recommendation: ?1. Breast conserving surgery ?2. Followed by adjuvant radiation therapy ?3. Followed by antiestrogen therapy with tamoxifen/aromatase inhibitors based on menopausal status ?5 years ? ?Tamoxifen counseling: We discussed the risks and benefits of tamoxifen. These include but not limited to insomnia, hot flashes, mood changes, vaginal dryness, and weight gain. Although rare, serious side effects including endometrial cancer, risk of blood clots were also discussed. We strongly believe that the benefits far outweigh the risks. Patient understands these risks and consented to starting treatment. Planned treatment duration is 5  years. ? ?Aromatase inhibitors counseling: We have discussed the mechanism of action of aromatase inhibitors today.  We have discussed adverse effects including but not limited to menopausal symptoms, increased risk of osteoporosis and fractures, cardiovascular events, arthralgias and myalgias.  We do believe that the benefits far outweigh the risks.  Plan treatment duration of 5 years. ? ?Since she is premenopausal, last cycle September 2022 we will proceed with tamoxifen after completion of radiation.  She denies any unusual menstrual cycles, she has never attained menopause prior to this. ?She will RTC in 6 weeks. ? ?No orders of the defined types were placed in this encounter. ? ?HISTORY OF PRESENTING ILLNESS:  ? ?Katrina Beard 62 y.o. female is here because of DCIS left breast. ? ?Jan 2023 showed calcifications in the left breast needing further evaluation ?She had diagnostic mammogram of left breast which showed indeterminate regional calcifications involving the upper outer quadrant of the left breast spanning at least 12 cm, recommend to site stereotactic biopsy of calcifications to exclude malignancy. ?She had left breast biopsy which showed DCIS with calcifications in the upper outer anterior calcifications.  Upper outer posterior calcifications showed complex sclerosing lesion with atypia and calcifications.   ?Prognostics from DCIS showed 100%, positive, strong staining intensity, progesterone receptor, 70%, positive, strong staining intensity. ? ?She is here by herself. 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 ?She is currently on wegovy, has been losing weight with this as well. ?She denies any unusual biopsies or mammograms prior to this.  No family history of breast cancer.  She denies any use of hormone replacement therapy. ? ?REVIEW OF SYSTEMS:   ?Constitutional: Denies fevers, chills or abnormal night sweats ?Eyes: Denies blurriness of vision,  double vision  or watery eyes ?Ears, nose, mouth, throat, and face: Denies mucositis or sore throat ?Respiratory: Denies cough, dyspnea or wheezes ?Cardiovascular: Denies palpitation, chest discomfort or lower extremity swelling ?Gastrointestinal:  Denies nausea, heartburn or change in bowel habits ?Skin: Denies abnormal skin rashes ?Lymphatics: Denies new lymphadenopathy or easy bruising ?Neurological:Denies numbness, tingling or new weaknesses ?Behavioral/Psych: Mood is stable, no new changes  ?All other systems were reviewed with the patient and are negative. ? ?MEDICAL HISTORY:  ?Past Medical History:  ?Diagnosis Date  ? Asthma   ? CHF (congestive heart failure) ([place])   ? Diabetes mellitus   ? Gallbladder problem   ? HLD (hyperlipidemia)   ? Hypertension   ? Joint pain   ? Knee pain   ? MI, old 2003  ? no stent placement  ? ? ?SURGICAL HISTORY: ?Past Surgical History:  ?Procedure Laterality Date  ? BREAST EXCISIONAL BIOPSY Bilateral   ? BREAST SURGERY    ? CHOLECYSTECTOMY    ? CYSTOSCOPY W/ URETERAL STENT PLACEMENT Right 07/25/2016  ? Procedure: CYSTOSCOPY WITH RETROGRADE PYELOGRAM/LEFT URETERAL STENT PLACEMENT;  Surgeon: Ardis Hughs, MD;  Location: [facility];  Service: Urology;  Laterality: Right;  ? HAND SURGERY    ? ROUX-EN-Y GASTRIC BYPASS    ? 07/15/2014  ? ? ?SOCIAL HISTORY: ?Social History  ? ?Socioeconomic History  ? Marital status: Single  ?  Spouse name: Not on file  ? Number of children: Not on file  ? Years of education: Not on file  ? Highest education level: Not on file  ?Occupational History  ? Occupation: Teacher, early years/pre  ?Tobacco Use  ? Smoking status: Never  ? Smokeless tobacco: Never  ?Vaping Use  ? Vaping Use: Never used  ?Substance and Sexual Activity  ? Alcohol use: No  ? Drug use: No  ? Sexual activity: Not on file  ?Other Topics Concern  ? Not on file  ?Social History Narrative  ? Not on file  ? ?Social Determinants of Health  ? ?Financial Resource Strain: Not on file  ?Food Insecurity: Not on file   ?Transportation Needs: Not on file  ?Physical Activity: Not on file  ?Stress: Not on file  ?Social Connections: Not on file  ?Intimate Partner Violence: Not on file  ? ? ?FAMILY HISTORY: ?Family History  ?Problem Relation Age of Onset  ? Breast cancer Mother 75  ? Hypertension Mother   ? Hyperlipidemia Mother   ? Obesity Mother   ? Hypertension Father   ? Heart disease Father   ? ? ?ALLERGIES:  is allergic to ibuprofen and lisinopril-hydrochlorothiazide. ? ?MEDICATIONS:  ?Current Outpatient Medications  ?Medication Sig Dispense Refill  ? amLODipine (NORVASC) 10 MG tablet Take 1 tablet (10 mg total) by mouth daily. 30 tablet 0  ? aspirin EC 81 MG tablet Take 81 mg by mouth daily.    ? atorvastatin (LIPITOR) 20 MG tablet Take 20 mg by mouth daily.    ? blood glucose meter kit and supplies KIT Dispense based on patient and insurance preference. Use up to four times daily as directed. 1 each 0  ? buPROPion (WELLBUTRIN SR) 200 MG 12 hr tablet Take 1 tablet (200 mg total) by mouth 2 (two) times daily. 30 tablet 0  ? cetirizine (ZYRTEC ALLERGY) 10 MG tablet Take 1 tablet (10 mg total) by mouth daily. 30 tablet 2  ? ferrous sulfate (FERROUSUL) 325 (65 FE) MG tablet Take 1 tablet (325 mg total) by mouth daily with breakfast.  30 tablet 0  ? fluticasone (FLONASE) 50 MCG/ACT nasal spray Place 1 spray into both nostrils in the morning and at bedtime. 16 g 2  ? ibuprofen (ADVIL) 800 MG tablet Take 800 mg by mouth every 8 (eight) hours as needed.    ? losartan (COZAAR) 100 MG tablet Take 100 mg by mouth daily.    ? ondansetron (ZOFRAN ODT) 4 MG disintegrating tablet Take 1 tablet (4 mg total) by mouth every 8 (eight) hours as needed for nausea or vomiting. 20 tablet 0  ? Semaglutide-Weight Management (WEGOVY) 2.4 MG/0.75ML SOAJ Inject 2.4 mg into the skin once a week. 3 mL 0  ? valsartan (DIOVAN) 320 MG tablet Take 320 mg by mouth daily.    ? Vitamin D, Ergocalciferol, (DRISDOL) 1.25 MG (50000 UNIT) CAPS capsule Take 1 capsule  (50,000 Units total) by mouth every 7 (seven) days. 4 capsule 0  ? ?No current facility-administered medications for this visit.  ? ? ? ?PHYSICAL EXAMINATION: ?ECOG PERFORMANCE STATUS: 0 - Asymptomatic ? ?Vi

## 2021-06-02 NOTE — Research (Signed)
MTG-015 - Tissue and Bodily Fluids: Translational Medicine: Discovery and Evaluation of Biomarkers/Pharmacogenomics for the Diagnosis and Personalized Management of Patients  ? ?06/02/21 ? ?Informed Consent:  ?Patient Katrina Beard was identified by Dr. Chryl Heck as a potential candidate for the above listed study.  This Clinical Research Coordinator met with Katrina Beard, [id number], on 06/02/21 in a manner and location that ensures patient privacy to discuss participation in the above listed research study.  Patient is Unaccompanied.  A copy of the informed consent document and separate HIPAA Authorization was provided to the patient.  Patient reads, speaks, and understands [place].   ? ?Patient was provided with the business card of this Coordinator and encouraged to contact the research team with any questions.  Patient was provided the option of taking informed consent documents home to review and was encouraged to review at their convenience with their support network, including other care providers. Patient is comfortable with making a decision regarding study participation today. ? ?As outlined in the informed consent form, this Coordinator and Katrina Beard discussed the purpose of the research study, the investigational nature of the study, study procedures and requirements for study participation, potential risks and benefits of study participation, as well as alternatives to participation. This study is not blinded. The patient understands participation is voluntary and they may withdraw from study participation at any time.  This study does not involve randomization.  This study does not involve an investigational drug or device. This study does not involve a placebo. Patient understands enrollment is pending full eligibility review.  ? ?Confidentiality and how the patient's information will be used as part of study participation were discussed.  Patient was informed there is reimbursement provided for  their time and effort spent on trial participation.  The patient is encouraged to discuss research study participation with their insurance provider to determine what costs they may incur as part of study participation, including research related injury.   ? ?All questions were answered to patient's satisfaction.  The informed consent and separate HIPAA Authorization was reviewed page by page.  The patient's mental and emotional status is appropriate to provide informed consent, and the patient verbalizes an understanding of study participation.  Patient has agreed to participate in the above listed research study and has voluntarily signed the informed consent version IRB approved  11/21/2022and separate HIPAA Authorization, version 5 revised 02/28/2019   on 06/02/21 at 11:10 AM.  The patient was provided with a copy of the signed informed consent form and separate HIPAA Authorization for their reference.  No study specific procedures were obtained prior to the signing of the informed consent document.  Approximately 15 minutes were spent with the patient reviewing the informed consent documents.  Patient was not requested to complete a Release of Information form. ? ?Eligibility:  ?This Coordinator has reviewed this patient's inclusion and exclusion criteria and confirmed Katrina Beard is eligible for study participation.  Patient will continue with enrollment. ? ?Specimen Collection:  ?Blood specimen was collected at 11:44am. ? ?Gift Card:  ?The patient was given a $50 visa gift card by this coordinator for participation in this study. ? ? ?Clabe Seal ?Clinical Research Coordinator I  ?06/02/21 11:58 AM ?

## 2021-06-02 NOTE — Progress Notes (Signed)
MTG-015 - Tissue and Bodily Fluids: Translational Medicine: Discovery and Evaluation of Biomarkers/Pharmacogenomics for the Diagnosis and Personalized Management of Patients   ? ?This Nurse has reviewed this patient's inclusion and exclusion criteria as a second review and confirms Katrina Beard is eligible for study participation.  Patient may continue with enrollment.  ? ?Marjie Skiff. Shironda Kain, RN, BSN, CHPN ?She  [facility]  [facility] ?Clinical Research Nurse ?[facility] ?Direct Dial [phone]  Pager [phone] ?06/02/2021 11:17 AM ?

## 2021-06-02 NOTE — Assessment & Plan Note (Signed)
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  We have discussed the following findings today with the patient ? ?Pathology review: I discussed with the patient the difference between DCIS and invasive breast cancer. It is considered a precancerous lesion. DCIS is classified as a Stage 0 breast cancer. It is generally detected through mammograms as calcifications. We discussed the significance of grades and its impact on prognosis. We also discussed the importance of ER and PR receptors and their implications to adjuvant treatment options. Prognosis of DCIS dependence on grade and degree of comedo necrosis. It is anticipated that if not treated, 20-30% of DCIS can develop into invasive breast cancer. ? ?Recommendation: ?1. Breast conserving surgery ?2. Followed by adjuvant radiation therapy ?3. Followed by antiestrogen therapy with tamoxifen/aromatase inhibitors based on menopausal status ?5 years ? ?Tamoxifen counseling: We discussed the risks and benefits of tamoxifen. These include but not limited to insomnia, hot flashes, mood changes, vaginal dryness, and weight gain. Although rare, serious side effects including endometrial cancer, risk of blood clots were also discussed. We strongly believe that the benefits far outweigh the risks. Patient understands these risks and consented to starting treatment. Planned treatment duration is 5 years. ? ?Aromatase inhibitors counseling: We have discussed the mechanism of action of aromatase inhibitors today.  We have discussed adverse effects including but not limited to menopausal symptoms, increased risk of osteoporosis and fractures, cardiovascular events, arthralgias and myalgias.  We do believe that the benefits far outweigh the risks.  Plan treatment duration of 5 years. ? ?Since she is  premenopausal, last cycle September 2022 we will proceed with tamoxifen after completion of radiation.  She denies any unusual menstrual cycles, she has never attained menopause prior to this. ?She will RTC in 6 weeks. ?

## 2021-06-02 NOTE — Progress Notes (Signed)
Spoke to [place] at [facility] regarding patients aspirin. She will contact PCP and patient as to how long to hold aspirin for upcoming procedure. ?

## 2021-06-03 ENCOUNTER — Encounter: Payer: Self-pay | Admitting: *Deleted

## 2021-06-03 DIAGNOSIS — D0512 Intraductal carcinoma in situ of left breast: Secondary | ICD-10-CM

## 2021-06-05 ENCOUNTER — Encounter (HOSPITAL_BASED_OUTPATIENT_CLINIC_OR_DEPARTMENT_OTHER)
Admission: RE | Admit: 2021-06-05 | Discharge: 2021-06-05 | Disposition: A | Discharge status: Home / Self Care | Payer: BC Managed Care – PPO | Source: Ambulatory Visit | Attending: Surgery | Admitting: Surgery

## 2021-06-05 ENCOUNTER — Other Ambulatory Visit: Payer: Self-pay

## 2021-06-05 DIAGNOSIS — Z9884 Bariatric surgery status: Secondary | ICD-10-CM | POA: Diagnosis not present

## 2021-06-05 DIAGNOSIS — Z79899 Other long term (current) drug therapy: Secondary | ICD-10-CM | POA: Diagnosis not present

## 2021-06-05 DIAGNOSIS — Z01812 Encounter for preprocedural laboratory examination: Secondary | ICD-10-CM | POA: Diagnosis present

## 2021-06-05 DIAGNOSIS — E119 Type 2 diabetes mellitus without complications: Secondary | ICD-10-CM | POA: Insufficient documentation

## 2021-06-05 DIAGNOSIS — D0512 Intraductal carcinoma in situ of left breast: Secondary | ICD-10-CM | POA: Diagnosis not present

## 2021-06-05 LAB — BASIC METABOLIC PANEL
Anion gap: 7 (ref 5–15)
BUN: 14 mg/dL (ref 6–20)
CO2: 28 mmol/L (ref 22–32)
Calcium: 8.9 mg/dL (ref 8.9–10.3)
Chloride: 104 mmol/L (ref 98–111)
Creatinine, Ser: 1.26 mg/dL — ABNORMAL HIGH (ref 0.44–1.00)
GFR, Estimated: 49 mL/min — ABNORMAL LOW (ref >60)
Glucose, Bld: 102 mg/dL — ABNORMAL HIGH (ref 70–99)
Potassium: 3.5 mmol/L (ref 3.5–5.1)
Sodium: 139 mmol/L (ref 135–145)

## 2021-06-05 NOTE — Progress Notes (Signed)
G2 given to pt with instruction to drink by 0800 DOS ?CHG given. Instruction taped to both bottles. Pt verbalized understanding ? ? ? ? ?Enhanced Recovery after Surgery for Orthopedics ?Enhanced Recovery after Surgery is a protocol used to improve the stress on your body and your recovery after surgery. ? ?Patient Instructions ? ?The night before surgery:  ?No food after midnight. ONLY clear liquids after midnight ? ?The day of surgery (if you do NOT have diabetes):  ?Drink ONE (1) Pre-Surgery Clear Ensure as directed.   ?This drink was given to you during your hospital  ?pre-op appointment visit. ?The pre-op nurse will instruct you on the time to drink the  ?Pre-Surgery Ensure depending on your surgery time. ?Finish the drink at the designated time by the pre-op nurse.  ?Nothing else to drink after completing the  ?Pre-Surgery Clear Ensure. ? ?The day of surgery (if you have diabetes): ?Drink ONE (1) Gatorade 2 (G2) as directed. ?This drink was given to you during your hospital  ?pre-op appointment visit.  ?The pre-op nurse will instruct you on the time to drink the  ? Gatorade 2 (G2) depending on your surgery time. ?Color of the Gatorade may vary. Red is not allowed. ?Nothing else to drink after completing the  ?Gatorade 2 (G2). ? ?       If you have questions, please contact your surgeon?s office. ?

## 2021-06-08 ENCOUNTER — Ambulatory Visit: Payer: BC Managed Care – PPO

## 2021-06-08 ENCOUNTER — Ambulatory Visit: Payer: BC Managed Care – PPO | Admitting: Radiation Oncology

## 2021-06-08 ENCOUNTER — Telehealth: Payer: Self-pay | Admitting: Licensed Clinical Social Worker

## 2021-06-09 NOTE — Telephone Encounter (Signed)
Disclosed negative BRCAPlus genetic testing. Will call Ms. Vater again when the remainder of testing is back.  ? ? ?

## 2021-06-10 ENCOUNTER — Ambulatory Visit
Admission: RE | Admit: 2021-06-10 | Discharge: 2021-06-10 | Disposition: A | Discharge status: Home / Self Care | Payer: BC Managed Care – PPO | Source: Ambulatory Visit | Attending: Surgery | Admitting: Surgery

## 2021-06-10 DIAGNOSIS — D0512 Intraductal carcinoma in situ of left breast: Secondary | ICD-10-CM

## 2021-06-11 ENCOUNTER — Ambulatory Visit
Admission: RE | Admit: 2021-06-11 | Discharge: 2021-06-11 | Disposition: A | Discharge status: Home / Self Care | Payer: BC Managed Care – PPO | Source: Ambulatory Visit | Attending: Surgery | Admitting: Surgery

## 2021-06-11 ENCOUNTER — Other Ambulatory Visit: Payer: Self-pay

## 2021-06-11 ENCOUNTER — Encounter (HOSPITAL_BASED_OUTPATIENT_CLINIC_OR_DEPARTMENT_OTHER)
Admission: RE | Disposition: A | Discharge status: Home / Self Care | Payer: Self-pay | Source: Home / Self Care | Attending: Surgery

## 2021-06-11 ENCOUNTER — Encounter (HOSPITAL_BASED_OUTPATIENT_CLINIC_OR_DEPARTMENT_OTHER): Payer: Self-pay | Admitting: Surgery

## 2021-06-11 ENCOUNTER — Ambulatory Visit (HOSPITAL_BASED_OUTPATIENT_CLINIC_OR_DEPARTMENT_OTHER): Payer: BC Managed Care – PPO | Admitting: Anesthesiology

## 2021-06-11 ENCOUNTER — Ambulatory Visit (HOSPITAL_BASED_OUTPATIENT_CLINIC_OR_DEPARTMENT_OTHER)
Admission: RE | Admit: 2021-06-11 | Discharge: 2021-06-11 | Disposition: A | Discharge status: Home / Self Care | Payer: BC Managed Care – PPO | Attending: Surgery | Admitting: Surgery

## 2021-06-11 ENCOUNTER — Ambulatory Visit (INDEPENDENT_AMBULATORY_CARE_PROVIDER_SITE_OTHER): Payer: BC Managed Care – PPO | Admitting: Bariatrics

## 2021-06-11 DIAGNOSIS — D0512 Intraductal carcinoma in situ of left breast: Secondary | ICD-10-CM

## 2021-06-11 DIAGNOSIS — E119 Type 2 diabetes mellitus without complications: Secondary | ICD-10-CM | POA: Insufficient documentation

## 2021-06-11 DIAGNOSIS — I10 Essential (primary) hypertension: Secondary | ICD-10-CM | POA: Diagnosis not present

## 2021-06-11 DIAGNOSIS — N6489 Other specified disorders of breast: Secondary | ICD-10-CM | POA: Insufficient documentation

## 2021-06-11 DIAGNOSIS — K219 Gastro-esophageal reflux disease without esophagitis: Secondary | ICD-10-CM | POA: Diagnosis not present

## 2021-06-11 DIAGNOSIS — I2510 Atherosclerotic heart disease of native coronary artery without angina pectoris: Secondary | ICD-10-CM | POA: Diagnosis not present

## 2021-06-11 DIAGNOSIS — Z803 Family history of malignant neoplasm of breast: Secondary | ICD-10-CM | POA: Insufficient documentation

## 2021-06-11 DIAGNOSIS — I252 Old myocardial infarction: Secondary | ICD-10-CM | POA: Diagnosis not present

## 2021-06-11 DIAGNOSIS — C50912 Malignant neoplasm of unspecified site of left female breast: Secondary | ICD-10-CM | POA: Diagnosis present

## 2021-06-11 HISTORY — DX: Gastro-esophageal reflux disease without esophagitis: K21.9

## 2021-06-11 HISTORY — DX: Malignant neoplasm of unspecified site of unspecified female breast: C50.919

## 2021-06-11 HISTORY — DX: Vitamin D deficiency, unspecified: E55.9

## 2021-06-11 HISTORY — DX: Calculus of kidney: N20.0

## 2021-06-11 HISTORY — PX: BREAST EXCISIONAL BIOPSY: SUR124

## 2021-06-11 HISTORY — PX: BREAST LUMPECTOMY WITH RADIOACTIVE SEED LOCALIZATION: SHX6424

## 2021-06-11 HISTORY — PX: BREAST LUMPECTOMY: SHX2

## 2021-06-11 HISTORY — DX: Atherosclerotic heart disease of native coronary artery without angina pectoris: I25.10

## 2021-06-11 LAB — GLUCOSE, CAPILLARY
Glucose-Capillary: 75 mg/dL (ref 70–99)
Glucose-Capillary: 79 mg/dL (ref 70–99)

## 2021-06-11 LAB — POCT PREGNANCY, URINE: Preg Test, Ur: NEGATIVE

## 2021-06-11 SURGERY — BREAST LUMPECTOMY WITH RADIOACTIVE SEED LOCALIZATION
Anesthesia: General | Site: Breast | Laterality: Left

## 2021-06-11 MED ORDER — GABAPENTIN 300 MG PO CAPS
300.0000 mg | ORAL_CAPSULE | ORAL | Status: AC
  Administered 2021-06-11: 300 mg via ORAL

## 2021-06-11 MED ORDER — CHLORHEXIDINE GLUCONATE CLOTH 2 % EX PADS: 6.0000 | MEDICATED_PAD | Freq: Once | CUTANEOUS | Status: DC

## 2021-06-11 MED ORDER — PROPOFOL 10 MG/ML IV BOLUS
INTRAVENOUS | Status: DC | PRN
  Administered 2021-06-11: 180 mg via INTRAVENOUS

## 2021-06-11 MED ORDER — PHENYLEPHRINE HCL (PRESSORS) 10 MG/ML IV SOLN
INTRAVENOUS | Status: DC | PRN
  Administered 2021-06-11 (×3): 80 ug via INTRAVENOUS

## 2021-06-11 MED ORDER — DEXAMETHASONE SODIUM PHOSPHATE 10 MG/ML IJ SOLN
INTRAMUSCULAR | Status: DC | PRN
  Administered 2021-06-11: 4 mg via INTRAVENOUS

## 2021-06-11 MED ORDER — ONDANSETRON HCL 4 MG/2ML IJ SOLN
INTRAMUSCULAR | Status: AC
  Filled 2021-06-11: qty 2

## 2021-06-11 MED ORDER — OXYCODONE HCL 5 MG PO TABS: 5.0000 mg | ORAL_TABLET | Freq: Once | ORAL | Status: DC | PRN

## 2021-06-11 MED ORDER — OXYCODONE HCL 5 MG PO TABS: 5.0000 mg | ORAL_TABLET | Freq: Four times a day (QID) | ORAL | 0 refills | Status: DC | PRN

## 2021-06-11 MED ORDER — GABAPENTIN 300 MG PO CAPS
ORAL_CAPSULE | ORAL | Status: AC
  Filled 2021-06-11: qty 1

## 2021-06-11 MED ORDER — FENTANYL CITRATE (PF) 100 MCG/2ML IJ SOLN
INTRAMUSCULAR | Status: AC
  Filled 2021-06-11: qty 2

## 2021-06-11 MED ORDER — ACETAMINOPHEN 500 MG PO TABS
1000.0000 mg | ORAL_TABLET | ORAL | Status: AC
  Administered 2021-06-11: 1000 mg via ORAL

## 2021-06-11 MED ORDER — MIDAZOLAM HCL 5 MG/5ML IJ SOLN
INTRAMUSCULAR | Status: DC | PRN
Start: 2021-06-11 — End: 2021-06-11
  Administered 2021-06-11: 2 mg via INTRAVENOUS

## 2021-06-11 MED ORDER — OXYCODONE HCL 5 MG/5ML PO SOLN: 5.0000 mg | Freq: Once | ORAL | Status: DC | PRN

## 2021-06-11 MED ORDER — CEFAZOLIN SODIUM-DEXTROSE 2-4 GM/100ML-% IV SOLN
2.0000 g | INTRAVENOUS | Status: AC
  Administered 2021-06-11: 2 g via INTRAVENOUS

## 2021-06-11 MED ORDER — FENTANYL CITRATE (PF) 100 MCG/2ML IJ SOLN
INTRAMUSCULAR | Status: DC | PRN
  Administered 2021-06-11: 50 ug via INTRAVENOUS

## 2021-06-11 MED ORDER — SODIUM CHLORIDE 0.9 % IV SOLN
INTRAVENOUS | Status: AC
  Filled 2021-06-11: qty 10

## 2021-06-11 MED ORDER — ONDANSETRON HCL 4 MG/2ML IJ SOLN
INTRAMUSCULAR | Status: DC | PRN
  Administered 2021-06-11: 4 mg via INTRAVENOUS

## 2021-06-11 MED ORDER — LACTATED RINGERS IV SOLN: INTRAVENOUS | Status: DC

## 2021-06-11 MED ORDER — MIDAZOLAM HCL 2 MG/2ML IJ SOLN
INTRAMUSCULAR | Status: AC
  Filled 2021-06-11: qty 2

## 2021-06-11 MED ORDER — BUPIVACAINE-EPINEPHRINE (PF) 0.25% -1:200000 IJ SOLN
INTRAMUSCULAR | Status: DC | PRN
  Administered 2021-06-11: 20 mL

## 2021-06-11 MED ORDER — CEFAZOLIN SODIUM-DEXTROSE 2-4 GM/100ML-% IV SOLN
INTRAVENOUS | Status: AC
  Filled 2021-06-11: qty 100

## 2021-06-11 MED ORDER — LIDOCAINE HCL (CARDIAC) PF 100 MG/5ML IV SOSY
PREFILLED_SYRINGE | INTRAVENOUS | Status: DC | PRN
  Administered 2021-06-11: 40 mg via INTRATRACHEAL

## 2021-06-11 MED ORDER — EPHEDRINE SULFATE (PRESSORS) 50 MG/ML IJ SOLN
INTRAMUSCULAR | Status: DC | PRN
  Administered 2021-06-11: 10 mg via INTRAVENOUS
  Administered 2021-06-11: 15 mg via INTRAVENOUS

## 2021-06-11 MED ORDER — ACETAMINOPHEN 500 MG PO TABS
ORAL_TABLET | ORAL | Status: AC
  Filled 2021-06-11: qty 2

## 2021-06-11 MED ORDER — SODIUM CHLORIDE 0.9 % IV SOLN
INTRAVENOUS | Status: DC | PRN
  Administered 2021-06-11: 500 mL

## 2021-06-11 MED ORDER — ONDANSETRON HCL 4 MG/2ML IJ SOLN: 4.0000 mg | Freq: Once | INTRAMUSCULAR | Status: DC | PRN

## 2021-06-11 MED ORDER — PROPOFOL 10 MG/ML IV BOLUS
INTRAVENOUS | Status: AC
  Filled 2021-06-11: qty 20

## 2021-06-11 MED ORDER — FENTANYL CITRATE (PF) 100 MCG/2ML IJ SOLN: 25.0000 ug | INTRAMUSCULAR | Status: DC | PRN

## 2021-06-11 SURGICAL SUPPLY — 42 items
ADH SKN CLS APL DERMABOND .7 (GAUZE/BANDAGES/DRESSINGS) ×1
APL PRP STRL LF DISP 70% ISPRP (MISCELLANEOUS) ×1
APPLIER CLIP 9.375 MED OPEN (MISCELLANEOUS) ×2
APR CLP MED 9.3 20 MLT OPN (MISCELLANEOUS) ×1
BINDER BREAST XXLRG (GAUZE/BANDAGES/DRESSINGS) ×1 IMPLANT
BLADE SURG 15 STRL LF DISP TIS (BLADE) ×1 IMPLANT
BLADE SURG 15 STRL SS (BLADE) ×2
CANISTER SUCT 1200ML W/VALVE (MISCELLANEOUS) ×1 IMPLANT
CHLORAPREP W/TINT 26 (MISCELLANEOUS) ×2 IMPLANT
CLIP APPLIE 9.375 MED OPEN (MISCELLANEOUS) IMPLANT
COVER BACK TABLE 60X90IN (DRAPES) ×2 IMPLANT
COVER MAYO STAND STRL (DRAPES) ×2 IMPLANT
COVER PROBE W GEL 5X96 (DRAPES) ×2 IMPLANT
DERMABOND ADVANCED (GAUZE/BANDAGES/DRESSINGS) ×1
DERMABOND ADVANCED .7 DNX12 (GAUZE/BANDAGES/DRESSINGS) ×1 IMPLANT
DRAPE LAPAROTOMY 100X72 PEDS (DRAPES) ×2 IMPLANT
DRAPE UTILITY XL STRL (DRAPES) ×2 IMPLANT
ELECT COATED BLADE 2.86 ST (ELECTRODE) ×2 IMPLANT
ELECT REM PT RETURN 9FT ADLT (ELECTROSURGICAL) ×2
ELECTRODE REM PT RTRN 9FT ADLT (ELECTROSURGICAL) ×1 IMPLANT
GLOVE SRG 8 PF TXTR STRL LF DI (GLOVE) ×1 IMPLANT
GLOVE SURG LTX SZ8 (GLOVE) ×2 IMPLANT
GLOVE SURG UNDER POLY LF SZ8 (GLOVE) ×2
GOWN STRL REUS W/ TWL LRG LVL3 (GOWN DISPOSABLE) ×2 IMPLANT
GOWN STRL REUS W/ TWL XL LVL3 (GOWN DISPOSABLE) ×1 IMPLANT
GOWN STRL REUS W/TWL LRG LVL3 (GOWN DISPOSABLE) ×4
GOWN STRL REUS W/TWL XL LVL3 (GOWN DISPOSABLE) ×2
KIT MARKER MARGIN INK (KITS) ×2 IMPLANT
NDL HYPO 25X1 1.5 SAFETY (NEEDLE) ×1 IMPLANT
NEEDLE HYPO 25X1 1.5 SAFETY (NEEDLE) ×2 IMPLANT
NS IRRIG 1000ML POUR BTL (IV SOLUTION) ×2 IMPLANT
PACK BASIN DAY SURGERY FS (CUSTOM PROCEDURE TRAY) ×2 IMPLANT
PENCIL SMOKE EVACUATOR (MISCELLANEOUS) ×2 IMPLANT
SLEEVE SCD COMPRESS KNEE MED (STOCKING) ×2 IMPLANT
SPONGE T-LAP 4X18 ~~LOC~~+RFID (SPONGE) ×2 IMPLANT
SUT MNCRL AB 4-0 PS2 18 (SUTURE) ×2 IMPLANT
SUT VICRYL 3-0 CR8 SH (SUTURE) ×2 IMPLANT
SYR CONTROL 10ML LL (SYRINGE) ×2 IMPLANT
TOWEL GREEN STERILE FF (TOWEL DISPOSABLE) ×2 IMPLANT
TRAY FAXITRON CT DISP (TRAY / TRAY PROCEDURE) ×2 IMPLANT
TUBE CONNECTING 20X1/4 (TUBING) ×1 IMPLANT
YANKAUER SUCT BULB TIP NO VENT (SUCTIONS) ×1 IMPLANT

## 2021-06-11 NOTE — Interval H&P Note (Signed)
History and Physical Interval Note: ? ?06/11/2021 ?11:45 AM ? ?BRIDGETT HATTABAUGH  has presented today for surgery, with the diagnosis of LEFT BREAST DCIS.  The various methods of treatment have been discussed with the patient and family. After consideration of risks, benefits and other options for treatment, the patient has consented to  Procedure(s): ?LEFT BREAST LUMPECTOMY WITH RADIOACTIVE SEED LOCALIZATION X2 (Left) as a surgical intervention.  The patient's history has been reviewed, patient examined, no change in status, stable for surgery.  I have reviewed the patient's chart and labs.  Questions were answered to the patient's satisfaction.   ? ? ?Tatiyana Foucher A Shemiah Rosch ? ? ?

## 2021-06-11 NOTE — H&P (Signed)
History of Present Illness: ?Katrina Beard is a 61 y.o. female who is seen today as an office consultation for evaluation of Breast Cancer ?.  ? ?Have an area of calcifications left breast upper outer quadrant measuring over 12 cm. 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 The other areas showed a CSL with some atypia. Mother had breast cancer in her 64s she states. No other families were noted to have breast cancer. This is her first breast biopsy. Patient denies any history of breast pain, nipple discharge or change to either breast. She had a previous lumpectomy for benign disease in the left side in the past. ? ?Review of Systems: ?A complete review of systems was obtained from the patient. I have reviewed this information and discussed as appropriate with the patient. See HPI as well for other ROS. ? ? ? ?Medical History: ?No past medical history on file. ? ?There is no problem list on file for this patient. ? ?No past surgical history on file.  ? ?Allergies  ?Allergen Reactions  ? Lisinopril Anaphylaxis and Angioedema  ? Bisoprolol Fumarate Other (See Comments)  ? Hctz/Reserpine/Hydralazine [Hydralazine-Reserpin-Hcthiazid] Other (See Comments)  ? Ibuprofen Other (See Comments)  ?Gastric bypass surgery-can't take anymore ?Gastric bypass surgery-can't take anymore ? ? Liraglutide Other (See Comments)  ? Lisinopril-Hydrochlorothiazide Other (See Comments)  ?Arm numbness and tongue blisters ?Arm numbness and tongue blisters ? ? Nsaids (Non-Steroidal Anti-Inflammatory Drug) Other (See Comments)  ? ?Current Outpatient Medications on File Prior to Visit  ?Medication Sig Dispense Refill  ? amLODIPine (NORVASC) 5 MG tablet Take 5 mg by mouth every morning  ? aspirin 81 MG EC tablet Take 81 mg by mouth once daily  ? atorvastatin (LIPITOR) 20 MG tablet Take 1 tablet by mouth once daily  ? iron bisgly,ps-FA-B-C#12-succ 65 mg-65 mg -1,000 mcg (24) Tab Take 65 mg by mouth  ? naproxen  sodium (ALEVE) 220 MG tablet Take 220 mg by mouth 2 (two) times daily with meals  ? valsartan (DIOVAN) 320 MG tablet 1 tablet  ? ?No current facility-administered medications on file prior to visit.  ? ?No family history on file.  ? ?Social History  ? ?Tobacco Use  ?Smoking Status Never  ?Smokeless Tobacco Never  ? ? ?Social History  ? ?Socioeconomic History  ? Marital status: Single  ?Tobacco Use  ? Smoking status: Never  ? Smokeless tobacco: Never  ?Substance and Sexual Activity  ? Alcohol use: Never  ? Drug use: Never  ? ?Objective:  ? ?Vitals:  ?05/25/21 0957  ?Pulse: 108  ?Temp: 36.7 ?C (98.1 ?F)  ?SpO2: 96%  ?Weight: 82 kg (180 lb 12.8 oz)  ?Height: 157.5 cm ('5\' 2"'$ )  ? ?Body mass index is 33.07 kg/m?. ? ?Physical Exam ?HENT:  ?Head: Normocephalic.  ?Eyes:  ?General: No scleral icterus. ?Pupils: Pupils are equal, round, and reactive to light.  ?Cardiovascular:  ?Rate and Rhythm: Normal rate.  ?Pulmonary:  ?Effort: Pulmonary effort is normal.  ?Breath sounds: No stridor.  ?Chest:  ?Breasts: ?Right: Normal. No bleeding or mass.  ?Left: Swelling present. No bleeding or mass.  ? ?Comments: Bruising noted left breast upper outer quadrant. Scar noted left breast upper outer quadrant. ?Musculoskeletal:  ?Cervical back: Normal range of motion.  ?Lymphadenopathy:  ?Upper Body:  ?Right upper body: No supraclavicular or axillary adenopathy.  ?Left upper body: No supraclavicular or axillary adenopathy.  ?Skin: ?General: Skin is warm.  ?Neurological:  ?General: No focal deficit present.  ?Mental Status:  She is alert.  ?Psychiatric:  ?Mood and Affect: Mood normal.  ?Behavior: Behavior normal.  ? ? ? ?Labs, Imaging and Diagnostic Testing: ? ?CLINICAL DATA: Patient returns after screening study for evaluation ?of LEFT breast calcifications. ?  ?EXAM: ?DIGITAL DIAGNOSTIC UNILATERAL LEFT MAMMOGRAM WITH CAD ?  ?TECHNIQUE: ?Left digital diagnostic mammography was performed. Mammographic ?images were processed with CAD. ?   ?COMPARISON: Previous exam(s). ?  ?ACR Breast Density Category b: There are scattered areas of ?fibroglandular density. ?  ?FINDINGS: ?Magnified views are performed of calcifications in the UPPER-OUTER ?QUADRANT of the LEFT breast. These views demonstrate regional faint ?pleomorphic calcifications. The area of calcifications spans 12.0 x ?6.4 x 6.3 centimeters. No associated mass or distortion. ?  ?IMPRESSION: ?Indeterminate regional calcifications involving the UPPER OUTER ?QUADRANT of the LEFT breast, spanning at least 12 centimeters. ?Recommend 2 site stereotactic biopsy of calcifications to exclude ?malignancy and to determine extent if biopsy is positive. ?  ?RECOMMENDATION: ?Two sites stereotactic biopsy of LEFT breast calcifications. ?  ?I have discussed the findings and recommendations with the patient. ?If applicable, a reminder letter will be sent to the patient ?regarding the next appointment. ?  ?BI-RADS CATEGORY 4: Suspicious. ?  ?  ?Electronically Signed ?By: Nolon Nations M.D. ?On: 05/06/2021 12:11 ? ?ADDITIONAL INFORMATION: ?2. PROGNOSTIC INDICATORS ?Results: ?IMMUNOHISTOCHEMICAL AND MORPHOMETRIC ANALYSIS PERFORMED MANUALLY ?Estrogen Receptor: 100%, POSITIVE, STRONG STAINING INTENSITY ?Progesterone Receptor: 70%, POSITIVE, STRONG STAINING INTENSITY ?REFERENCE RANGE ESTROGEN RECEPTOR ?NEGATIVE 0% ?POSITIVE =>1% ?REFERENCE RANGE PROGESTERONE RECEPTOR ?NEGATIVE 0% ?POSITIVE =>1% ?All controls stained appropriately ?Thressa Sheller MD ?Pathologist, Electronic Signature ?( Signed 05/19/2021) ?FINAL DIAGNOSIS ?Diagnosis ?1. Breast, left, needle core biopsy, Upper outer posterior calcifications, x clip ?- COMPLEX SCLEROSING LESION WITH ATYPIA AND CALCIFICATIONS ?- SEE COMMENT ?2. Breast, left, needle core biopsy, Upper outer anterior calcifications, coil clip ?- DUCTAL CARCINOMA IN SITU WITH CALCIFICATIONS ?- SEE COMMENT ?1 of 2 ?FINAL for RAUL, WINTERHALTER ([id number]) ?Microscopic Comment ?1. Dr. Martinique  reviewed the case and agrees with the above diagnosis. These results were called to [facility] on May 18, 2021. ?2. Based on the biopsy, the ductal carcinoma in situ has a cribriform pattern, intermediate nuclear grade and measures ?0.2 cm in greatest linear extent. Prognostic markers (ER/PR) are pending and will be reported in an addendum. Dr. ?Martinique reviewed the case and agrees with the above diagnosis. ?Thressa Sheller MD ?Pathologist, Electronic Signature ?(Case signed 05/18/2021) ? ?Assessment and Plan:  ? ?Diagnoses and all orders for this visit: ? ?Ductal carcinoma in situ (DCIS) of left breast ? ? ? ?Discussed the pathophysiology of DCIS. Discussed surgical options of treatment and medical options of treatment as well as potential need for radiation therapy. Discussed breast conserving surgery versus mastectomy. The pros and cons of each discussed, long-term survival, long-term recurrence rates and potential complications of each. She is opted for left breast seed localized lumpectomy since she wishes to conserve her breast. She will require 2 seeds. This area can be bracketed. Since there is some confusion as to how much of the disease process exist, I feel a generous lumpectomy is a good first step. I do not feel MRI would add much with with DCIS that we are not aware of this point.The procedure has been discussed with the patient. Alternatives to surgery have been discussed with the patient. Risks of surgery include bleeding, cosmesis, Infection, Seroma formation, death, and the need for further surgery. The patient understands and wishes to proceed. ? ?No follow-ups on  file. ? ?Kennieth Francois, MD   ?

## 2021-06-11 NOTE — Discharge Instructions (Addendum)
?Post Anesthesia Home Care Instructions ? ?Activity: ?Get plenty of rest for the remainder of the day. A responsible individual must stay with you for 24 hours following the procedure.  ?For the next 24 hours, DO NOT: ?-Drive a car ?-Paediatric nurse ?-Drink alcoholic beverages ?-Take any medication unless instructed by your physician ?-Make any legal decisions or sign important papers. ? ?Meals: ?Start with liquid foods such as gelatin or soup. Progress to regular foods as tolerated. Avoid greasy, spicy, heavy foods. If nausea and/or vomiting occur, drink only clear liquids until the nausea and/or vomiting subsides. Call your physician if vomiting continues. ? ?Special Instructions/Symptoms: ?Your throat may feel dry or sore from the anesthesia or the breathing tube placed in your throat during surgery. If this causes discomfort, gargle with warm salt water. The discomfort should disappear within 24 hours. ? ?If you had a scopolamine patch placed behind your ear for the management of post- operative nausea and/or vomiting: ? ?1. The medication in the patch is effective for 72 hours, after which it should be removed.  Wrap patch in a tissue and discard in the trash. Wash hands thoroughly with soap and water. ?2. You may remove the patch earlier than 72 hours if you experience unpleasant side effects which may include dry mouth, dizziness or visual disturbances. ?3. Avoid touching the patch. Wash your hands with soap and water after contact with the patch. ?   ?[facility] ?Office Phone Number [phone] ? ?BREAST BIOPSY/ PARTIAL MASTECTOMY: POST OP INSTRUCTIONS ? ?Always review your discharge instruction sheet given to you by the facility where your surgery was performed. ? ?IF YOU HAVE DISABILITY OR FAMILY LEAVE FORMS, YOU MUST BRING THEM TO THE OFFICE FOR PROCESSING.  DO NOT GIVE THEM TO YOUR DOCTOR. ? ?A prescription for pain medication may be given to you upon discharge.  Take your pain  medication as prescribed, if needed.  If narcotic pain medicine is not needed, then you may take acetaminophen (Tylenol) or ibuprofen (Advil) as needed. ?Take your usually prescribed medications unless otherwise directed ?If you need a refill on your pain medication, please contact your pharmacy.  They will contact our office to request authorization.  Prescriptions will not be filled after 5pm or on week-ends. ?You should eat very light the first 24 hours after surgery, such as soup, crackers, pudding, etc.  Resume your normal diet the day after surgery. ?Most patients will experience some swelling and bruising in the breast.  Ice packs and a good support bra will help.  Swelling and bruising can take several days to resolve.  ?It is common to experience some constipation if taking pain medication after surgery.  Increasing fluid intake and taking a stool softener will usually help or prevent this problem from occurring.  A mild laxative (Milk of Magnesia or Miralax) should be taken according to package directions if there are no bowel movements after 48 hours. ?Unless discharge instructions indicate otherwise, you may remove your bandages 24-48 hours after surgery, and you may shower at that time.  You may have steri-strips (small skin tapes) in place directly over the incision.  These strips should be left on the skin for 7-10 days.  If your surgeon used skin glue on the incision, you may shower in 24 hours.  The glue will flake off over the next 2-3 weeks.  Any sutures or staples will be removed at the office during your follow-up visit. ?ACTIVITIES:  You may resume regular daily activities (gradually increasing)  beginning the next day.  Wearing a good support bra or sports bra minimizes pain and swelling.  You may have sexual intercourse when it is comfortable. ?You may drive when you no longer are taking prescription pain medication, you can comfortably wear a seatbelt, and you can safely maneuver your car and  apply brakes. ?RETURN TO WORK:  ______________________________________________________________________________________ ?You should see your doctor in the office for a follow-up appointment approximately two weeks after your surgery.  Your doctor?s nurse will typically make your follow-up appointment when she calls you with your pathology report.  Expect your pathology report 2-3 business days after your surgery.  You may call to check if you do not hear from [place] after three days. ?OTHER INSTRUCTIONS: _______________________________________________________________________________________________ _____________________________________________________________________________________________________________________________________ ?_____________________________________________________________________________________________________________________________________ ?_____________________________________________________________________________________________________________________________________ ? ?WHEN TO CALL YOUR DOCTOR: ?Fever over 101.0 ?Nausea and/or vomiting. ?Extreme swelling or bruising. ?Continued bleeding from incision. ?Increased pain, redness, or drainage from the incision. ? ?The clinic staff is available to answer your questions during regular business hours.  Please don?t hesitate to call and ask to speak to one of the nurses for clinical concerns.  If you have a medical emergency, go to the nearest emergency room or call 911.  A surgeon from [facility] is always on call at the hospital. ? ?For further questions, please visit [email or web address]   ?

## 2021-06-11 NOTE — Anesthesia Procedure Notes (Signed)
Procedure Name: LMA Insertion ?Date/Time: 06/11/2021 12:24 PM ?Performed by: Glory Buff, CRNA ?Pre-anesthesia Checklist: Patient identified, Emergency Drugs available, Suction available and Patient being monitored ?Patient Re-evaluated:Patient Re-evaluated prior to induction ?Oxygen Delivery Method: Circle system utilized ?Preoxygenation: Pre-oxygenation with 100% oxygen ?Induction Type: IV induction ?LMA: LMA inserted ?LMA Size: 4.0 ?Number of attempts: 1 ?Placement Confirmation: positive ETCO2 ?Tube secured with: Tape ?Dental Injury: Teeth and Oropharynx as per pre-operative assessment  ? ? ? ? ?

## 2021-06-11 NOTE — Op Note (Signed)
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 ?Postop diagnosis: Same ? ?Procedure: Left breast seed localized lumpectomy utilizing a bracketed approach ? ?Surgeon: Erroll Luna, MD ? ?Anesthesia: LMA with 0.25% Marcaine plain ? ?EBL: Minimal ? ?Specimen: Left breast tissue with seed and clip verified by Faxitron.  There were 2 seeds and 2 clips of note. ? ?Drains: None ? ?Indications for procedure: The patient is a pleasant 61 year old female with left breast DCIS.  There is a large area we talked about mastectomy versus breast conserving surgery.  She desired to keep her breast therefore an attempted breast conservation was offered.  She understood the potential need for mastectomy if this fails.  Risks and benefits of surgery were reviewed and she agreed to proceed.The procedure has been discussed with the patient. Alternatives to surgery have been discussed with the patient.  Risks of surgery include bleeding,  Infection,  Seroma formation, death,  and the need for further surgery.   The patient understands and wishes to proceed.  ? ? ?Description of procedure: The patient was met in the holding area and questions answered.  Left breast was marked as correct site and seed films were available for review.  She was taken back to the operative room.  She is placed supine upon the OR table.  After induction of general esthesia, left breast was prepped and draped in a sterile fashion timeout performed.  Proper patient, site and procedure verified.  The seeds were marked and films were available for review.  A curvilinear incision was made in the left breast upper outer quadrant.  All tissue around both seeds and clips were excised.  Margins appear grossly negative.  The Faxitron image revealed 2 seeds and 2 clips which bracketed the area of disease.  Irrigation was used.  Hemostasis achieved with cautery.  Local anesthetic infiltrated throughout the cavity then closed the cavity closed with 3-0  Vicryl for Monocryl.  Dermabond applied.  All counts found to be correct.  Breast binder placed.  The patient was awoke extubated taken to recovery in satisfactory condition. ?

## 2021-06-11 NOTE — Anesthesia Preprocedure Evaluation (Addendum)
Anesthesia Evaluation  ?Patient identified by MRN, date of birth, ID band ?Patient awake ? ? ? ?Reviewed: ?Allergy & Precautions, NPO status , Patient's Chart, lab work & pertinent test results ? ?History of Anesthesia Complications ?Negative for: history of anesthetic complications ? ?Airway ?Mallampati: I ? ?TM Distance: >3 FB ?Neck ROM: Full ? ? ? Dental ? ?(+) Dental Advisory Given, Teeth Intact ?  ?Pulmonary ?asthma ,  ?  ?Pulmonary exam normal ? ? ? ? ? ? ? Cardiovascular ?hypertension, Pt. on medications ?+ CAD and + Past MI  ?Normal cardiovascular exam ? ? ?  ?Neuro/Psych ?PSYCHIATRIC DISORDERS Depression negative neurological ROS ?   ? GI/Hepatic ?Neg liver ROS, GERD  Controlled, ?S/p gastric bypass ? ?  ?Endo/Other  ?diabetes, Type 2 ?Obesity ? ? Renal/GU ?Renal InsufficiencyRenal disease  ? ?  ?Musculoskeletal ?negative musculoskeletal ROS ?(+)  ? Abdominal ?  ?Peds ? Hematology ?negative hematology ROS ?(+)   ?Anesthesia Other Findings ? ? Reproductive/Obstetrics ? ?Breast cancer ? ? ?  ? ? ? ? ? ? ? ? ? ? ? ? ? ?  ?  ? ? ? ? ? ? ? ?Anesthesia Physical ?Anesthesia Plan ? ?ASA: 2 ? ?Anesthesia Plan: General  ? ?Post-op Pain Management: Tylenol PO (pre-op)*  ? ?Induction: Intravenous ? ?PONV Risk Score and Plan: 3 and Treatment may vary due to age or medical condition, Ondansetron, Dexamethasone, Midazolam and Scopolamine patch - Pre-op ? ?Airway Management Planned: LMA ? ?Additional Equipment: None ? ?Intra-op Plan:  ? ?Post-operative Plan: Extubation in OR ? ?Informed Consent: I have reviewed the patients History and Physical, chart, labs and discussed the procedure including the risks, benefits and alternatives for the proposed anesthesia with the patient or authorized representative who has indicated his/her understanding and acceptance.  ? ? ? ?Dental advisory given ? ?Plan Discussed with: CRNA and Anesthesiologist ? ?Anesthesia Plan Comments:   ? ? ? ? ? ?Anesthesia  Quick Evaluation ? ?

## 2021-06-11 NOTE — Transfer of Care (Signed)
Immediate Anesthesia Transfer of Care Note ? ?Patient: Katrina Beard ? ?Procedure(s) Performed: LEFT BREAST LUMPECTOMY WITH RADIOACTIVE SEED LOCALIZATION X2 (Left: Breast) ? ?Patient Location: PACU ? ?Anesthesia Type:General ? ?Level of Consciousness: drowsy and patient cooperative ? ?Airway & Oxygen Therapy: Patient Spontanous Breathing and Patient connected to face mask oxygen ? ?Post-op Assessment: Report given to RN and Post -op Vital signs reviewed and stable ? ?Post vital signs: Reviewed and stable ? ?Last Vitals:  ?Vitals Value Taken Time  ?BP    ?Temp    ?Pulse 85 06/11/21 1334  ?Resp    ?SpO2 100 % 06/11/21 1334  ?Vitals shown include unvalidated device data. ? ?Last Pain:  ?Vitals:  ? 06/11/21 1034  ?TempSrc: Oral  ?PainSc: 0-No pain  ?   ? ?Patients Stated Pain Goal: 8 (06/11/21 1034) ? ?Complications: No notable events documented. ?

## 2021-06-12 ENCOUNTER — Encounter (HOSPITAL_BASED_OUTPATIENT_CLINIC_OR_DEPARTMENT_OTHER): Payer: Self-pay | Admitting: Surgery

## 2021-06-12 NOTE — Anesthesia Postprocedure Evaluation (Signed)
Anesthesia Post Note ? ?Patient: Katrina Beard ? ?Procedure(s) Performed: LEFT BREAST LUMPECTOMY WITH RADIOACTIVE SEED LOCALIZATION X2 (Left: Breast) ? ?  ? ?Patient location during evaluation: PACU ?Anesthesia Type: General ?Level of consciousness: awake and alert ?Pain management: pain level controlled ?Vital Signs Assessment: post-procedure vital signs reviewed and stable ?Respiratory status: spontaneous breathing, nonlabored ventilation and respiratory function stable ?Cardiovascular status: stable and blood pressure returned to baseline ?Anesthetic complications: no ? ? ?No notable events documented. ? ?Last Vitals:  ?Vitals:  ? 06/11/21 1345 06/11/21 1413  ?BP: (!) 155/85 (!) 161/77  ?Pulse: 75 73  ?Resp: 16 18  ?Temp:  36.9 ?C  ?SpO2: 100% 98%  ?  ?Last Pain:  ?Vitals:  ? 06/12/21 1006  ?TempSrc:   ?PainSc: 3   ? ?Pain Goal: Patients Stated Pain Goal: 8 (06/11/21 1034) ? ?  ?  ?  ?  ?  ?  ?  ? ?Audry Pili ? ? ? ? ?

## 2021-06-16 ENCOUNTER — Encounter: Payer: Self-pay | Admitting: Surgery

## 2021-06-16 ENCOUNTER — Telehealth: Payer: Self-pay | Admitting: Licensed Clinical Social Worker

## 2021-06-16 LAB — SURGICAL PATHOLOGY

## 2021-06-17 ENCOUNTER — Encounter (HOSPITAL_COMMUNITY): Payer: Self-pay

## 2021-06-18 ENCOUNTER — Encounter: Payer: Self-pay | Admitting: *Deleted

## 2021-06-23 ENCOUNTER — Ambulatory Visit (INDEPENDENT_AMBULATORY_CARE_PROVIDER_SITE_OTHER): Payer: BC Managed Care – PPO | Admitting: Bariatrics

## 2021-06-23 ENCOUNTER — Encounter (INDEPENDENT_AMBULATORY_CARE_PROVIDER_SITE_OTHER): Payer: Self-pay | Admitting: Bariatrics

## 2021-06-23 VITALS — BP 138/82 | HR 76 | Temp 98.3°F | Ht 62.0 in | Wt 179.0 lb

## 2021-06-23 DIAGNOSIS — E1169 Type 2 diabetes mellitus with other specified complication: Secondary | ICD-10-CM

## 2021-06-23 DIAGNOSIS — F3289 Other specified depressive episodes: Secondary | ICD-10-CM

## 2021-06-23 DIAGNOSIS — E66813 Obesity, class 3: Secondary | ICD-10-CM

## 2021-06-23 DIAGNOSIS — D509 Iron deficiency anemia, unspecified: Secondary | ICD-10-CM | POA: Diagnosis not present

## 2021-06-23 DIAGNOSIS — E559 Vitamin D deficiency, unspecified: Secondary | ICD-10-CM | POA: Diagnosis not present

## 2021-06-23 DIAGNOSIS — Z6832 Body mass index (BMI) 32.0-32.9, adult: Secondary | ICD-10-CM

## 2021-06-23 DIAGNOSIS — I10 Essential (primary) hypertension: Secondary | ICD-10-CM | POA: Diagnosis not present

## 2021-06-23 DIAGNOSIS — E669 Obesity, unspecified: Secondary | ICD-10-CM

## 2021-06-23 DIAGNOSIS — Z7985 Long-term (current) use of injectable non-insulin antidiabetic drugs: Secondary | ICD-10-CM

## 2021-06-23 MED ORDER — AMLODIPINE BESYLATE 10 MG PO TABS: 10.0000 mg | ORAL_TABLET | Freq: Every day | ORAL | 0 refills | Status: DC

## 2021-06-23 MED ORDER — FERROUS SULFATE 325 (65 FE) MG PO TABS: 325.0000 mg | ORAL_TABLET | Freq: Every day | ORAL | 0 refills | Status: DC

## 2021-06-23 MED ORDER — VITAMIN D (ERGOCALCIFEROL) 1.25 MG (50000 UNIT) PO CAPS: 50000.0000 [IU] | ORAL_CAPSULE | ORAL | 0 refills | Status: DC

## 2021-06-23 MED ORDER — BUPROPION HCL ER (SR) 200 MG PO TB12: 200.0000 mg | ORAL_TABLET | Freq: Every day | ORAL | 0 refills | Status: DC

## 2021-06-23 MED ORDER — WEGOVY 2.4 MG/0.75ML ~~LOC~~ SOAJ: 2.4000 mg | SUBCUTANEOUS | 0 refills | Status: DC

## 2021-06-25 NOTE — Progress Notes (Signed)
? ? ? ?Chief Complaint:  ? ?OBESITY ?Katrina Beard is here to discuss her progress with her obesity treatment plan along with follow-up of her obesity related diagnoses. Katrina Beard is on practicing portion control and making smarter food choices, such as increasing vegetables and decreasing simple carbohydrates and states she is following her eating plan approximately 60% of the time. Katrina Beard states she is doing 0 minutes 0 times per week. ? ?Today's visit was #: 40 ?Starting weight: 231 lbs ?Starting date: 04/03/2019 ?Today's weight: 179 lbs ?Today's date: 06/25/2021 ?Total lbs lost to date: 52 lbs ?Total lbs lost since last in-office visit: 0 ? ?Interim History: Katrina Beard is up 1 lb since her last visit. She was seen by myself on 03/17/2021. She thinks that her appetite is controlled.  ? ?Subjective:  ? ?1. Essential hypertension ?Katrina Beard's blood pressure is essentially controlled. Her last blood pressure was 143/84. ? ?2. Iron deficiency anemia, unspecified iron deficiency anemia type ?We discussed iron deficiency today.  ? ?3. Type 2 diabetes mellitus with other specified complication, without long-term current use of insulin (Katrina Beard) ?Trenda is currently taking Wegovy 2.4 mg.  ? ?4. Vitamin D deficiency ?Katrina Beard is taking Vitamin D currently.  ? ?5. Other depression, with emotional eating ?Katrina Beard notes emotional eating.  ? ?Assessment/Plan:  ? ?1. Essential hypertension ?We will refill Norvasc 10 mg for 1 month with no refills. Harshita is working on healthy weight loss and exercise to improve blood pressure control. We will watch for signs of hypotension as she continues her lifestyle modifications. ? ?- amLODipine (NORVASC) 10 MG tablet; Take 1 tablet (10 mg total) by mouth daily.  Dispense: 30 tablet; Refill: 0 ? ?2. Iron deficiency anemia, unspecified iron deficiency anemia type ?Orders and follow up as documented in patient record. We will refill ferrous sulfate 325 mg for 1 month with no refills.  ? ?Counseling ?Iron is essential for our  bodies to make red blood cells.  Reasons that someone may be deficient include: an iron-deficient diet (more likely in those following vegan or vegetarian diets), women with heavy menses, patients with GI disorders or poor absorption, patients that have had bariatric surgery, frequent blood donors, patients with cancer, and patients with heart disease.   ?An iron supplement has been recommended. This is found over-the-counter.  ?Iron-rich foods include dark leafy greens, red and white meats, eggs, seafood, and beans.   ?Certain foods and drinks prevent your body from absorbing iron properly. Avoid eating these foods in the same meal as iron-rich foods or with iron supplements. These foods include: coffee, black tea, and red wine; milk, dairy products, and foods that are high in calcium; beans and soybeans; whole grains.  ?Constipation can be a side effect of iron supplementation. Increased water and fiber intake are helpful. Water goal: > 2 liters/day. Fiber goal: > 25 grams/day.  ? ?- ferrous sulfate (FERROUSUL) 325 (65 FE) MG tablet; Take 1 tablet (325 mg total) by mouth daily with breakfast.  Dispense: 30 tablet; Refill: 0 ? ?3. Type 2 diabetes mellitus with other specified complication, without long-term current use of insulin ([place]) ?Katrina Beard will continue Wegovy 2.4 mg. Good blood sugar control is important to decrease the likelihood of diabetic complications such as nephropathy, neuropathy, limb loss, blindness, coronary artery disease, and death. Intensive lifestyle modification including diet, exercise and weight loss are the first line of treatment for diabetes.  ? ?- Semaglutide-Weight Management (WEGOVY) 2.4 MG/0.75ML SOAJ; Inject 2.4 mg into the skin once a week.  Dispense: 3  mL; Refill: 0 ? ?4. Vitamin D deficiency ?Low Vitamin D level contributes to fatigue and are associated with obesity, breast, and colon cancer. We will refill prescription Vitamin D 50,000 IU every week for 1 month with no refills and  Leyla will follow-up for routine testing of Vitamin D, at least 2-3 times per year to avoid over-replacement. ? ?- Vitamin D, Ergocalciferol, (DRISDOL) 1.25 MG (50000 UNIT) CAPS capsule; Take 1 capsule (50,000 Units total) by mouth every 7 (seven) days.  Dispense: 4 capsule; Refill: 0 ? ?5. Other depression, with emotional eating ?We will refill Wellbutrin SR 200 mg for 1 month with no refills. Behavior modification techniques were discussed today to help Katrina Beard deal with her emotional/non-hunger eating behaviors.  Orders and follow up as documented in patient record.  ? ?- buPROPion (WELLBUTRIN SR) 200 MG 12 hr tablet; Take 1 tablet (200 mg total) by mouth daily.  Dispense: 30 tablet; Refill: 0 ? ?6. Obesity with current BMI of 32.8 ?Katrina Beard is currently in the action stage of change. As such, her goal is to continue with weight loss efforts. She has agreed to keeping a food journal and adhering to recommended goals of 1400 calories and 80-90 grams of protein and practicing portion control and making smarter food choices, such as increasing vegetables and decreasing simple carbohydrates.  ? ?Katrina Beard will adhere to her number of calories and protein. We will refill Wegovy 2.4 mg for 1 month with no refills.  ? ?- Semaglutide-Weight Management (WEGOVY) 2.4 MG/0.75ML SOAJ; Inject 2.4 mg into the skin once a week.  Dispense: 3 mL; Refill: 0 ? ?Exercise goals: No exercise has been prescribed at this time. ? ?Behavioral modification strategies: increasing lean protein intake, decreasing simple carbohydrates, increasing vegetables, increasing water intake, decreasing eating out, no skipping meals, meal planning and cooking strategies, keeping healthy foods in the home, and planning for success. ? ?Katrina Beard has agreed to follow-up with our clinic in 4 weeks with Abby Potash PA-C or Dr. Leafy Ro or Jake Bathe, [place]. She was informed of the importance of frequent follow-up visits to maximize her success with intensive lifestyle  modifications for her multiple health conditions.  ? ?Objective:  ? ?Blood pressure 138/82, pulse 76, temperature 98.3 ?F (36.8 ?C), height '5\' 2"'$  (1.575 m), weight 179 lb (81.2 kg), SpO2 100 %. ?Body mass index is 32.74 kg/m?. ? ?General: Cooperative, alert, well developed, in no acute distress. ?HEENT: Conjunctivae and lids unremarkable. ?Cardiovascular: Regular rhythm.  ?Lungs: Normal work of breathing. ?Neurologic: No focal deficits.  ? ?Lab Results  ?Component Value Date  ? CREATININE 1.26 (H) 06/05/2021  ? BUN 14 06/05/2021  ? NA 139 06/05/2021  ? K 3.5 06/05/2021  ? CL 104 06/05/2021  ? CO2 28 06/05/2021  ? ?Lab Results  ?Component Value Date  ? ALT 19 11/13/2020  ? AST 48 (A) 04/15/2021  ? ALKPHOS 92 04/15/2021  ? BILITOT 0.5 11/13/2020  ? ?Lab Results  ?Component Value Date  ? HGBA1C 5.5 04/15/2021  ? HGBA1C 5.5 11/13/2020  ? HGBA1C 6.0 (H) 06/24/2020  ? HGBA1C 5.9 (H) 01/08/2020  ? HGBA1C 6.3 (H) 08/06/2019  ? ?Lab Results  ?Component Value Date  ? INSULIN 11.2 04/03/2019  ? ?Lab Results  ?Component Value Date  ? TSH 0.575 04/03/2019  ? ?Lab Results  ?Component Value Date  ? CHOL 161 04/15/2021  ? HDL 55 04/15/2021  ? [place] 91 04/15/2021  ? TRIG 80 04/15/2021  ? ?Lab Results  ?Component Value Date  ?  VD25OH 48.4 11/13/2020  ? VD25OH 44.4 06/24/2020  ? VD25OH 55.2 01/08/2020  ? ?Lab Results  ?Component Value Date  ? WBC 6.5 04/15/2021  ? HGB 13.0 04/15/2021  ? HCT 39 04/15/2021  ? MCV 86 06/24/2020  ? PLT 279 04/15/2021  ? ?Lab Results  ?Component Value Date  ? IRON 162 04/15/2021  ? TIBC 252 06/24/2020  ? FERRITIN 64 06/24/2020  ? ?Attestation Statements:  ? ?Reviewed by clinician on day of visit: allergies, medications, problem list, medical history, surgical history, family history, social history, and previous encounter notes. ? ?I, Lizbeth Bark, RMA, am acting as transcriptionist for [employer], DO. ? ?I have reviewed the above documentation for accuracy and completeness, and I agree with the above. Jearld Lesch, DO ? ?

## 2021-06-30 ENCOUNTER — Encounter (INDEPENDENT_AMBULATORY_CARE_PROVIDER_SITE_OTHER): Payer: Self-pay | Admitting: Family Medicine

## 2021-06-30 NOTE — Telephone Encounter (Signed)
FYI

## 2021-07-02 ENCOUNTER — Encounter (INDEPENDENT_AMBULATORY_CARE_PROVIDER_SITE_OTHER): Payer: Self-pay | Admitting: Bariatrics

## 2021-07-06 NOTE — Progress Notes (Signed)
?Radiation Oncology         [phone] ?________________________________ ? ?Name: Katrina Beard        MRN: [id number]  ?Date of Service: 07/09/2021 DOB: 07/20/1960 ? ?[id number], Ivin Booty, MD  Benay Pike, MD    ? ?REFERRING PHYSICIAN: Benay Pike, MD ? ? ?DIAGNOSIS: The encounter diagnosis was Malignant neoplasm of upper-outer quadrant of left breast in female, estrogen receptor positive ([place]). ? ? ?HISTORY OF PRESENT ILLNESS: Katrina Beard is a 61 y.o. female with a recent  diagnosis of Left breast cancer.  The patient was found on screening mammogram to have calcifications in the upper outer quadrant.  By diagnostic imaging, these were noted to span 12 cm.  She underwent biopsy on 05/15/2021 of the anterior and posterior aspects of the calcifications and this showed both intermediate grade DCIS with associated calcifications, her tumor was ER/PR positive.   ? ?Since her last visit she underwent lumpectomy of the left breast on 06/11/21 that showed a 1.5 cm intermediate grade DCIS with complex sclerosing lesion with intraductal papilloma and usual ductal hyperplasia. Margins were negative, and she's seen to discuss adjuvant radiotherapy.  ? ?PREVIOUS RADIATION THERAPY: No ? ? ?PAST MEDICAL HISTORY:  ?Past Medical History:  ?Diagnosis Date  ? Asthma   ? Breast cancer ([place])   ? CAD (coronary artery disease)   ? CHF (congestive heart failure) ([place])   ? Diabetes mellitus   ? Gallbladder problem   ? GERD (gastroesophageal reflux disease)   ? HLD (hyperlipidemia)   ? Hypertension   ? Joint pain   ? Kidney stone   ? Knee pain   ? MI, old 2003  ? no stent placement  ? Vitamin D deficiency   ?   ? ? ?PAST SURGICAL HISTORY: ?Past Surgical History:  ?Procedure Laterality Date  ? BREAST EXCISIONAL BIOPSY Bilateral   ? BREAST LUMPECTOMY WITH RADIOACTIVE SEED LOCALIZATION Left 06/11/2021  ? Procedure: LEFT BREAST LUMPECTOMY WITH RADIOACTIVE SEED LOCALIZATION X2;  Surgeon: Erroll Luna, MD;  Location: [facility];  Service: General;  Laterality: Left;  ? BREAST SURGERY    ? CHOLECYSTECTOMY    ? CYSTOSCOPY W/ URETERAL STENT PLACEMENT Right 07/25/2016  ? Procedure: CYSTOSCOPY WITH RETROGRADE PYELOGRAM/LEFT URETERAL STENT PLACEMENT;  Surgeon: Ardis Hughs, MD;  Location: [facility];  Service: Urology;  Laterality: Right;  ? HAND SURGERY    ? ROUX-EN-Y GASTRIC BYPASS    ? 07/15/2014  ? ? ? ?FAMILY HISTORY:  ?Family History  ?Problem Relation Age of Onset  ? Breast cancer Mother 2  ? Hypertension Mother   ? Hyperlipidemia Mother   ? Obesity Mother   ? Hypertension Father   ? Heart disease Father   ? ? ? ?SOCIAL HISTORY:  reports that she has never smoked. She has never used smokeless tobacco. She reports that she does not drink alcohol and does not use drugs.  The patient is single and lives in [place].  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 She is an avid reader.  ? ? ?ALLERGIES: Ibuprofen and Lisinopril-hydrochlorothiazide ? ? ?MEDICATIONS:  ?Current Outpatient Medications  ?Medication Sig Dispense Refill  ? amLODipine (NORVASC) 10 MG tablet Take 1 tablet (10 mg total) by mouth daily. 30 tablet 0  ? aspirin EC 81 MG tablet Take 81 mg by mouth daily.    ? atorvastatin (LIPITOR) 20 MG tablet Take 20 mg by mouth daily.    ? blood glucose meter kit and  supplies KIT Dispense based on patient and insurance preference. Use up to four times daily as directed. 1 each 0  ? buPROPion (WELLBUTRIN SR) 200 MG 12 hr tablet Take 1 tablet (200 mg total) by mouth daily. 30 tablet 0  ? ferrous sulfate (FERROUSUL) 325 (65 FE) MG tablet Take 1 tablet (325 mg total) by mouth daily with breakfast. 30 tablet 0  ? fluticasone (FLONASE) 50 MCG/ACT nasal spray Place 1 spray into both nostrils in the morning and at bedtime. 16 g 2  ? Multiple Vitamins-Iron (MULTIVITAMIN/IRON PO) Take by mouth.    ? oxyCODONE (OXY IR/ROXICODONE) 5 MG immediate release tablet Take 1 tablet (5 mg total) by mouth every 6 (six) hours as needed for  severe pain. 15 tablet 0  ? Semaglutide-Weight Management (WEGOVY) 2.4 MG/0.75ML SOAJ Inject 2.4 mg into the skin once a week. 3 mL 0  ? valsartan (DIOVAN) 320 MG tablet Take 320 mg by mouth daily.    ? Vitamin D, Ergocalciferol, (DRISDOL) 1.25 MG (50000 UNIT) CAPS capsule Take 1 capsule (50,000 Units total) by mouth every 7 (seven) days. 4 capsule 0  ? ?No current facility-administered medications for this visit.  ? ? ? ?REVIEW OF SYSTEMS: On review of systems, the patient reports that she is doing well and healing nicely since surgery. She denies any concerns with her incision site, or with fevers or pain. She's concerned about her FMLA and was told she's at risk of losing her job. No other complaints are verbalized.  ? ?  ? ?PHYSICAL EXAM:  ?Wt Readings from Last 3 Encounters:  ?06/23/21 179 lb (81.2 kg)  ?06/11/21 175 lb 7.8 oz (79.6 kg)  ?06/02/21 179 lb 9.6 oz (81.5 kg)  ? ?Temp Readings from Last 3 Encounters:  ?06/23/21 98.3 ?F (36.8 ?C)  ?06/11/21 98.4 ?F (36.9 ?C)  ?06/02/21 98 ?F (36.7 ?C)  ? ?BP Readings from Last 3 Encounters:  ?06/23/21 138/82  ?06/11/21 (!) 161/77  ?06/02/21 133/72  ? ?Pulse Readings from Last 3 Encounters:  ?06/23/21 76  ?06/11/21 73  ?06/02/21 86  ? ? ?In general this is a well appearing African-American female in no acute distress. She's alert and oriented x4 and appropriate throughout the examination. Cardiopulmonary assessment is negative for acute distress and she exhibits normal effort.  Her left breast has a well-healed surgical site without evidence of erythema, or drainage. A small area of granulation tissue is noted toward the lateral edge of her incision but no deep separation. ? ?ECOG = 1 ? ?0 - Asymptomatic (Fully active, able to carry on all predisease activities without restriction) ? ?1 - Symptomatic but completely ambulatory (Restricted in physically strenuous activity but ambulatory and able to carry out work of a light or sedentary nature. For example, light  housework, office work) ? ?2 - Symptomatic, <50% in bed during the day (Ambulatory and capable of all self care but unable to carry out any work activities. Up and about more than 50% of waking hours) ? ?3 - Symptomatic, >50% in bed, but not bedbound (Capable of only limited self-care, confined to bed or chair 50% or more of waking hours) ? ?4 - Bedbound (Completely disabled. Cannot carry on any self-care. Totally confined to bed or chair) ? ?5 - Death ? ? Oken MM, Creech RH, Tormey DC, et al. [phone]). "Toxicity and response criteria of the [facility]". [place] Oncol. 5 (6): 649-55 ? ? ? ?LABORATORY DATA:  ?Lab Results  ?Component Value  Date  ? WBC 6.5 04/15/2021  ? HGB 13.0 04/15/2021  ? HCT 39 04/15/2021  ? MCV 86 06/24/2020  ? PLT 279 04/15/2021  ? ?Lab Results  ?Component Value Date  ? NA 139 06/05/2021  ? K 3.5 06/05/2021  ? CL 104 06/05/2021  ? CO2 28 06/05/2021  ? ?Lab Results  ?Component Value Date  ? ALT 19 11/13/2020  ? AST 48 (A) 04/15/2021  ? ALKPHOS 92 04/15/2021  ? BILITOT 0.5 11/13/2020  ? ?  ? ?RADIOGRAPHY: MM Breast Surgical Specimen ? ?Result Date: 06/11/2021 ?CLINICAL DATA:  Evaluate specimen EXAM: SPECIMEN RADIOGRAPH OF THE LEFT BREAST COMPARISON:  Previous exam(s). FINDINGS: Status post excision of the left breast. The radioactive seed and biopsy marker clip are present, completely intact, and were marked for pathology. IMPRESSION: Specimen radiograph of the left breast. Electronically Signed   By: Dorise Bullion III M.D.   On: 06/11/2021 13:05 ? ?MM LT RADIOACTIVE SEED LOC MAMMO GUIDE ? ?Result Date: 06/10/2021 ?CLINICAL DATA:  61 year old with biopsy-proven intermediate grade DCIS involving the UPPER OUTER QUADRANT of the LEFT breast at anterior to middle depth (associated with a coil shaped tissue marker clip), and biopsy proven complex sclerosing lesion with atypia involving the UPPER OUTER QUADRANT of the LEFT breast at middle to posterior depth (associated with  an X shaped tissue marking clip). Bracketed radioactive seed localization is performed in anticipation of lumpectomy. EXAM: MAMMOGRAPHIC GUIDED RADIOACTIVE SEED LOCALIZATION OF THE LEFT BREAST x 2 COMPARISON:

## 2021-07-08 ENCOUNTER — Telehealth: Payer: Self-pay

## 2021-07-08 NOTE — Telephone Encounter (Signed)
In-person appointment reminder. I verified patient identity and reminded patient of her 7:30am-07/09/21 in-person appointment w/ Shona Simpson PA-C. I advised patient to arrive 37mn early for check-in. I left my extension [phone] case patient needs anything. Patient verbalized understanding of information. ?

## 2021-07-09 ENCOUNTER — Encounter: Payer: Self-pay | Admitting: Radiation Oncology

## 2021-07-09 ENCOUNTER — Ambulatory Visit
Admission: RE | Admit: 2021-07-09 | Discharge: 2021-07-09 | Disposition: A | Discharge status: Home / Self Care | Payer: BC Managed Care – PPO | Source: Ambulatory Visit | Attending: Radiation Oncology | Admitting: Radiation Oncology

## 2021-07-09 ENCOUNTER — Encounter: Payer: Self-pay | Admitting: Licensed Clinical Social Worker

## 2021-07-09 ENCOUNTER — Other Ambulatory Visit: Payer: Self-pay

## 2021-07-09 VITALS — BP 150/76 | HR 71 | Temp 97.9°F | Resp 21 | Ht 62.0 in | Wt 183.0 lb

## 2021-07-09 DIAGNOSIS — C50412 Malignant neoplasm of upper-outer quadrant of left female breast: Secondary | ICD-10-CM | POA: Insufficient documentation

## 2021-07-09 DIAGNOSIS — I10 Essential (primary) hypertension: Secondary | ICD-10-CM | POA: Diagnosis not present

## 2021-07-09 DIAGNOSIS — D0512 Intraductal carcinoma in situ of left breast: Secondary | ICD-10-CM | POA: Insufficient documentation

## 2021-07-09 DIAGNOSIS — Z79899 Other long term (current) drug therapy: Secondary | ICD-10-CM | POA: Insufficient documentation

## 2021-07-09 DIAGNOSIS — J45909 Unspecified asthma, uncomplicated: Secondary | ICD-10-CM | POA: Insufficient documentation

## 2021-07-09 DIAGNOSIS — Z87442 Personal history of urinary calculi: Secondary | ICD-10-CM | POA: Diagnosis not present

## 2021-07-09 DIAGNOSIS — E559 Vitamin D deficiency, unspecified: Secondary | ICD-10-CM | POA: Diagnosis not present

## 2021-07-09 DIAGNOSIS — Z1379 Encounter for other screening for genetic and chromosomal anomalies: Secondary | ICD-10-CM | POA: Insufficient documentation

## 2021-07-09 DIAGNOSIS — Z170 Estrogen receptor positive status [ER+]: Secondary | ICD-10-CM | POA: Diagnosis not present

## 2021-07-09 DIAGNOSIS — K219 Gastro-esophageal reflux disease without esophagitis: Secondary | ICD-10-CM | POA: Insufficient documentation

## 2021-07-09 DIAGNOSIS — E785 Hyperlipidemia, unspecified: Secondary | ICD-10-CM | POA: Diagnosis not present

## 2021-07-09 DIAGNOSIS — Z7982 Long term (current) use of aspirin: Secondary | ICD-10-CM | POA: Diagnosis not present

## 2021-07-09 DIAGNOSIS — I509 Heart failure, unspecified: Secondary | ICD-10-CM | POA: Insufficient documentation

## 2021-07-09 DIAGNOSIS — Z803 Family history of malignant neoplasm of breast: Secondary | ICD-10-CM | POA: Insufficient documentation

## 2021-07-09 DIAGNOSIS — E119 Type 2 diabetes mellitus without complications: Secondary | ICD-10-CM | POA: Insufficient documentation

## 2021-07-09 DIAGNOSIS — I2510 Atherosclerotic heart disease of native coronary artery without angina pectoris: Secondary | ICD-10-CM | POA: Insufficient documentation

## 2021-07-09 NOTE — Progress Notes (Signed)
Follow-up New appointment. I verified patient identity and began nursing interview. Patient reports mild, sharp, occasional pain in the LT breast. No other issues reported at this time. ? ?Meaningful use complete. ?Current menstruals- No chances of pregnancy. ? ?BP (!) 150/76 (BP Location: Right Arm, Patient Position: Sitting, Cuff Size: Normal)   Pulse 71   Temp 97.9 ?F (36.6 ?C) (Oral)   Resp (!) 21   Ht '5\' 2"'$  (1.575 m)   Wt 183 lb (83 kg)   SpO2 100%   BMI 33.47 kg/m?  ? ?

## 2021-07-09 NOTE — Telephone Encounter (Signed)
Attempted to reach Ms. Troiano x3 with remainder of genetic test results. Will send letter with instructions to reach out to me to review results together.  ?

## 2021-07-10 ENCOUNTER — Encounter (INDEPENDENT_AMBULATORY_CARE_PROVIDER_SITE_OTHER): Payer: Self-pay | Admitting: Physician Assistant

## 2021-07-10 DIAGNOSIS — D0512 Intraductal carcinoma in situ of left breast: Secondary | ICD-10-CM | POA: Diagnosis not present

## 2021-07-13 NOTE — Telephone Encounter (Signed)
Last OV with Dr Brown 

## 2021-07-15 ENCOUNTER — Encounter (INDEPENDENT_AMBULATORY_CARE_PROVIDER_SITE_OTHER): Payer: Self-pay | Admitting: Family Medicine

## 2021-07-16 ENCOUNTER — Encounter: Payer: Self-pay | Admitting: *Deleted

## 2021-07-16 ENCOUNTER — Other Ambulatory Visit: Payer: Self-pay

## 2021-07-16 ENCOUNTER — Telehealth: Payer: Self-pay | Admitting: Licensed Clinical Social Worker

## 2021-07-16 ENCOUNTER — Ambulatory Visit
Admission: RE | Admit: 2021-07-16 | Discharge: 2021-07-16 | Disposition: A | Discharge status: Home / Self Care | Payer: BC Managed Care – PPO | Source: Ambulatory Visit | Attending: Radiation Oncology | Admitting: Radiation Oncology

## 2021-07-16 DIAGNOSIS — Z170 Estrogen receptor positive status [ER+]: Secondary | ICD-10-CM | POA: Diagnosis present

## 2021-07-16 DIAGNOSIS — C50412 Malignant neoplasm of upper-outer quadrant of left female breast: Secondary | ICD-10-CM | POA: Insufficient documentation

## 2021-07-16 LAB — RAD ONC ARIA SESSION SUMMARY
Course Elapsed Days: 0
Plan Fractions Treated to Date: 1
Plan Prescribed Dose Per Fraction: 2.66 Gy
Plan Total Fractions Prescribed: 16
Plan Total Prescribed Dose: 42.56 Gy
Reference Point Dosage Given to Date: 2.66 Gy
Reference Point Session Dosage Given: 2.66 Gy
Session Number: 1

## 2021-07-16 NOTE — Telephone Encounter (Signed)
Revealed negative genetic testing.  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 This normal result is reassuring and indicates that it is unlikely Ms. Gunkel's cancer is due to a hereditary cause.  It is unlikely that there is an increased risk of another cancer due to a mutation in one of these genes.  However, genetic testing is not perfect, and cannot definitively rule out a hereditary cause.  It will be important for her to keep in contact with genetics to learn if any additional testing may be needed in the future.    ? ?

## 2021-07-17 ENCOUNTER — Ambulatory Visit: Payer: BC Managed Care – PPO

## 2021-07-17 ENCOUNTER — Ambulatory Visit
Admission: RE | Admit: 2021-07-17 | Discharge: 2021-07-17 | Disposition: A | Discharge status: Home / Self Care | Payer: BC Managed Care – PPO | Source: Ambulatory Visit | Attending: Radiation Oncology | Admitting: Radiation Oncology

## 2021-07-17 ENCOUNTER — Other Ambulatory Visit: Payer: Self-pay

## 2021-07-17 DIAGNOSIS — C50412 Malignant neoplasm of upper-outer quadrant of left female breast: Secondary | ICD-10-CM

## 2021-07-17 LAB — RAD ONC ARIA SESSION SUMMARY
Course Elapsed Days: 1
Plan Fractions Treated to Date: 2
Plan Prescribed Dose Per Fraction: 2.66 Gy
Plan Total Fractions Prescribed: 16
Plan Total Prescribed Dose: 42.56 Gy
Reference Point Dosage Given to Date: 5.32 Gy
Reference Point Session Dosage Given: 2.66 Gy
Session Number: 2

## 2021-07-17 MED ORDER — ALRA NON-METALLIC DEODORANT (RAD-ONC)
1.0000 "application " | Freq: Once | TOPICAL | Status: AC
  Administered 2021-07-17: 1 via TOPICAL

## 2021-07-17 MED ORDER — RADIAPLEXRX EX GEL: Freq: Once | CUTANEOUS | Status: AC

## 2021-07-17 NOTE — Progress Notes (Signed)
Pt here for patient teaching.  Pt given Radiation and You booklet, skin care instructions, alra deodorant and Radiaplex.    Reviewed areas of pertinence such as fatigue, hair loss, skin changes, breast tenderness, and breast swelling. Pt able to give teach back of to pat skin and use unscented/gentle soap,apply Radiaplex bid, avoid applying anything to skin within 4 hours of treatment, avoid wearing an under wire bra, and to use an electric razor if they must shave. Pt verbalizes understanding of information given and will contact nursing with any questions or concerns.    Symphani Eckstrom M. Mariapaula Krist RN, BSN  

## 2021-07-20 ENCOUNTER — Other Ambulatory Visit: Payer: Self-pay

## 2021-07-20 ENCOUNTER — Ambulatory Visit
Admission: RE | Admit: 2021-07-20 | Discharge: 2021-07-20 | Disposition: A | Discharge status: Home / Self Care | Payer: BC Managed Care – PPO | Source: Ambulatory Visit | Attending: Radiation Oncology | Admitting: Radiation Oncology

## 2021-07-20 ENCOUNTER — Telehealth: Payer: Self-pay | Admitting: Hematology and Oncology

## 2021-07-20 DIAGNOSIS — C50412 Malignant neoplasm of upper-outer quadrant of left female breast: Secondary | ICD-10-CM | POA: Diagnosis not present

## 2021-07-20 LAB — RAD ONC ARIA SESSION SUMMARY
Course Elapsed Days: 4
Plan Fractions Treated to Date: 3
Plan Prescribed Dose Per Fraction: 2.66 Gy
Plan Total Fractions Prescribed: 16
Plan Total Prescribed Dose: 42.56 Gy
Reference Point Dosage Given to Date: 7.98 Gy
Reference Point Session Dosage Given: 2.66 Gy
Session Number: 3

## 2021-07-20 NOTE — Telephone Encounter (Signed)
.  Called patient to schedule appointment per 5/8 inbasket, patient is aware of date and time.   ?

## 2021-07-21 ENCOUNTER — Encounter: Payer: Self-pay | Admitting: *Deleted

## 2021-07-21 ENCOUNTER — Ambulatory Visit
Admission: RE | Admit: 2021-07-21 | Discharge: 2021-07-21 | Disposition: A | Discharge status: Home / Self Care | Payer: BC Managed Care – PPO | Source: Ambulatory Visit | Attending: Radiation Oncology | Admitting: Radiation Oncology

## 2021-07-21 ENCOUNTER — Ambulatory Visit: Payer: Self-pay | Admitting: Licensed Clinical Social Worker

## 2021-07-21 ENCOUNTER — Telehealth: Payer: Self-pay

## 2021-07-21 ENCOUNTER — Other Ambulatory Visit: Payer: Self-pay

## 2021-07-21 DIAGNOSIS — C50412 Malignant neoplasm of upper-outer quadrant of left female breast: Secondary | ICD-10-CM | POA: Diagnosis not present

## 2021-07-21 DIAGNOSIS — Z1379 Encounter for other screening for genetic and chromosomal anomalies: Secondary | ICD-10-CM

## 2021-07-21 LAB — RAD ONC ARIA SESSION SUMMARY
Course Elapsed Days: 5
Plan Fractions Treated to Date: 4
Plan Prescribed Dose Per Fraction: 2.66 Gy
Plan Total Fractions Prescribed: 16
Plan Total Prescribed Dose: 42.56 Gy
Reference Point Dosage Given to Date: 10.64 Gy
Reference Point Session Dosage Given: 2.66 Gy
Session Number: 4

## 2021-07-21 NOTE — Telephone Encounter (Signed)
Notified Patient of completion of Disability Form. Fax transmission confirmation received. Copy of form placed for pick-up as requested by Patient. No other needs or concerns voiced at this time. ?

## 2021-07-21 NOTE — Progress Notes (Signed)
HPI:  Ms. Buenaventura was previously seen in the [facility] clinic due to a personal and family history of cancer and concerns regarding a hereditary predisposition to cancer. Please refer to our prior cancer genetics clinic note for more information regarding our discussion, assessment and recommendations, at the time. Ms. Peyser recent genetic test results were disclosed to her, as were recommendations warranted by these results. These results and recommendations are discussed in more detail below. ? ?CANCER HISTORY:  ?Oncology History  ? No history exists.  ? ? ?FAMILY HISTORY:  ?We obtained a detailed, 4-generation family history.  Significant diagnoses are listed below: ?Family History  ?Problem Relation Age of Onset  ? Breast cancer Mother 16  ? Hypertension Mother   ? Hyperlipidemia Mother   ? Obesity Mother   ? Hypertension Father   ? Heart disease Father   ? ?Ms. Mangini has 1 daughter, Beatrix Fetters, 46, no history of cancer. She has 3 maternal half brothers, no cancers.  ?  ?Ms. Orlich's mother had breast cancer at 40 and died at 53. Patient had 1 maternal uncle who died in his 59s. No known cancers in maternal cousins or grandparents. Grandparents passed around age 91. ?  ?Ms. Olazabal's father died at 76. He was an only child. Patient has no information about paternal grandparents. ?  ?Ms. Buller is unaware of previous family history of genetic testing for hereditary cancer risks. There is no reported Ashkenazi Jewish ancestry. There is no known consanguinity. ?  ? ? ? ?GENETIC TEST RESULTS: Genetic testing reported out on 06/15/2021 through the CustomNext+RNA cancer panel found no pathogenic mutations.  ? ?The CustomNext-Cancer+RNAinsight panel offered by [facility] includes sequencing and rearrangement analysis for the following 47 genes:  APC, ATM, AXIN2, BARD1, BMPR1A, BRCA1, BRCA2, BRIP1, CDH1, CDK4, CDKN2A, CHEK2, DICER1, EPCAM, GREM1, HOXB13, MEN1, MLH1, MSH2, MSH3, MSH6, MUTYH, NBN, NF1, NF2,  NTHL1, PALB2, PMS2, POLD1, POLE, PTEN, RAD51C, RAD51D, RECQL, RET, SDHA, SDHAF2, SDHB, SDHC, SDHD, SMAD4, SMARCA4, STK11, TP53, TSC1, TSC2, and VHL.  RNA data is routinely analyzed for use in variant interpretation for all genes.  ? ?The test report has been scanned into EPIC and is located under the Molecular Pathology section of the Results Review tab.  A portion of the result report is included below for reference.  ? ? ?We discussed that because current genetic testing is not perfect, it is possible there may be a gene mutation in one of these genes that current testing cannot detect, but that chance is small.  There could be another gene that has not yet been discovered, or that we have not yet tested, that is responsible for the cancer diagnoses in the family. It is also possible there is a hereditary cause for the cancer in the family that Ms. Tineo did not inherit and therefore was not identified in her testing.  Therefore, it is important to remain in touch with cancer genetics in the future so that we can continue to offer Ms. Rossbach the most up to date genetic testing.  ? 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  At this time, it is unknown if this variant is associated with increased cancer risk or if this is a normal finding, but most variants such as this get reclassified to being inconsequential. It should not be used to make medical management decisions. With time, we suspect the lab will determine the significance of this variant, if  any. If we do learn more about it we will try to contact Ms. Gilder to discuss it further. However, it is important to stay in touch with [place] periodically and keep the address and phone number up to date. ? ?ADDITIONAL GENETIC TESTING: We discussed with Ms. Huesman that her genetic testing was fairly extensive.  If there are genes identified to increase cancer risk that can be analyzed in the future, we would be  happy to discuss and coordinate this testing at that time.   ? ?CANCER SCREENING RECOMMENDATIONS: Ms. Knezevic test result is considered negative (normal).  This means that we have not identified a hereditary cause for her  personal and family history of cancer at this time. Most cancers happen by chance and this negative test suggests that her cancer may fall into this category.   ? ?While reassuring, this does not definitively rule out a hereditary predisposition to cancer. It is still possible that there could be genetic mutations that are undetectable by current technology. There could be genetic mutations in genes that have not been tested or identified to increase cancer risk.  Therefore, it is recommended she continue to follow the cancer management and screening guidelines provided by her oncology and primary healthcare provider.  ? ?An individual's cancer risk and medical management are not determined by genetic test results alone. Overall cancer risk assessment incorporates additional factors, including personal medical history, family history, and any available genetic information that may result in a personalized plan for cancer prevention and surveillance. ? ?RECOMMENDATIONS FOR FAMILY MEMBERS:  Relatives in this family might be at some increased risk of developing cancer, over the general population risk, simply due to the family history of cancer.  We recommended female relatives in this family have a yearly mammogram beginning at age 30, or 21 years younger than the earliest onset of cancer, an annual clinical breast exam, and perform monthly breast self-exams. Female relatives in this family should also have a gynecological exam as recommended by their primary provider.  All family members should be referred for colonoscopy starting at age 16.  ? ?FOLLOW-UP: Lastly, we discussed with Ms. Babin that cancer genetics is a rapidly advancing field and it is possible that new genetic tests will be  appropriate for her and/or her family members in the future. We encouraged her to remain in contact with cancer genetics on an annual basis so we can update her personal and family histories and let her know of advances in cancer genetics that may benefit this family.  ? ?Our contact number was provided. Ms. Dannemiller questions were answered to her satisfaction, and she knows she is welcome to call us at anytime with additional questions or concerns.  ? ?Faith Rogue, MS, LCGC ?Genetic Counselor ?[email or web address] ?Phone: [phone] ? ?

## 2021-07-22 ENCOUNTER — Other Ambulatory Visit: Payer: Self-pay

## 2021-07-22 ENCOUNTER — Ambulatory Visit (INDEPENDENT_AMBULATORY_CARE_PROVIDER_SITE_OTHER): Payer: BC Managed Care – PPO | Admitting: Physician Assistant

## 2021-07-22 ENCOUNTER — Ambulatory Visit
Admission: RE | Admit: 2021-07-22 | Discharge: 2021-07-22 | Disposition: A | Discharge status: Home / Self Care | Payer: BC Managed Care – PPO | Source: Ambulatory Visit | Attending: Radiation Oncology | Admitting: Radiation Oncology

## 2021-07-22 DIAGNOSIS — C50412 Malignant neoplasm of upper-outer quadrant of left female breast: Secondary | ICD-10-CM | POA: Diagnosis not present

## 2021-07-22 LAB — RAD ONC ARIA SESSION SUMMARY
Course Elapsed Days: 6
Plan Fractions Treated to Date: 5
Plan Prescribed Dose Per Fraction: 2.66 Gy
Plan Total Fractions Prescribed: 16
Plan Total Prescribed Dose: 42.56 Gy
Reference Point Dosage Given to Date: 13.3 Gy
Reference Point Session Dosage Given: 2.66 Gy
Session Number: 5

## 2021-07-23 ENCOUNTER — Ambulatory Visit
Admission: RE | Admit: 2021-07-23 | Discharge: 2021-07-23 | Disposition: A | Discharge status: Home / Self Care | Payer: BC Managed Care – PPO | Source: Ambulatory Visit | Attending: Radiation Oncology | Admitting: Radiation Oncology

## 2021-07-23 ENCOUNTER — Other Ambulatory Visit: Payer: Self-pay

## 2021-07-23 DIAGNOSIS — C50412 Malignant neoplasm of upper-outer quadrant of left female breast: Secondary | ICD-10-CM | POA: Diagnosis not present

## 2021-07-23 LAB — RAD ONC ARIA SESSION SUMMARY
Course Elapsed Days: 7
Plan Fractions Treated to Date: 6
Plan Prescribed Dose Per Fraction: 2.66 Gy
Plan Total Fractions Prescribed: 16
Plan Total Prescribed Dose: 42.56 Gy
Reference Point Dosage Given to Date: 15.96 Gy
Reference Point Session Dosage Given: 2.66 Gy
Session Number: 6

## 2021-07-24 ENCOUNTER — Ambulatory Visit
Admission: RE | Admit: 2021-07-24 | Discharge: 2021-07-24 | Disposition: A | Discharge status: Home / Self Care | Payer: BC Managed Care – PPO | Source: Ambulatory Visit | Attending: Radiation Oncology | Admitting: Radiation Oncology

## 2021-07-24 ENCOUNTER — Other Ambulatory Visit: Payer: Self-pay

## 2021-07-24 DIAGNOSIS — C50412 Malignant neoplasm of upper-outer quadrant of left female breast: Secondary | ICD-10-CM | POA: Diagnosis not present

## 2021-07-24 LAB — RAD ONC ARIA SESSION SUMMARY
Course Elapsed Days: 8
Plan Fractions Treated to Date: 7
Plan Prescribed Dose Per Fraction: 2.66 Gy
Plan Total Fractions Prescribed: 16
Plan Total Prescribed Dose: 42.56 Gy
Reference Point Dosage Given to Date: 18.62 Gy
Reference Point Session Dosage Given: 2.66 Gy
Session Number: 7

## 2021-07-27 ENCOUNTER — Other Ambulatory Visit: Payer: Self-pay

## 2021-07-27 ENCOUNTER — Ambulatory Visit
Admission: RE | Admit: 2021-07-27 | Discharge: 2021-07-27 | Disposition: A | Discharge status: Home / Self Care | Payer: BC Managed Care – PPO | Source: Ambulatory Visit | Attending: Radiation Oncology | Admitting: Radiation Oncology

## 2021-07-27 DIAGNOSIS — C50412 Malignant neoplasm of upper-outer quadrant of left female breast: Secondary | ICD-10-CM | POA: Diagnosis not present

## 2021-07-27 LAB — RAD ONC ARIA SESSION SUMMARY
Course Elapsed Days: 11
Plan Fractions Treated to Date: 8
Plan Prescribed Dose Per Fraction: 2.66 Gy
Plan Total Fractions Prescribed: 16
Plan Total Prescribed Dose: 42.56 Gy
Reference Point Dosage Given to Date: 21.28 Gy
Reference Point Session Dosage Given: 2.66 Gy
Session Number: 8

## 2021-07-28 ENCOUNTER — Other Ambulatory Visit: Payer: Self-pay

## 2021-07-28 ENCOUNTER — Ambulatory Visit
Admission: RE | Admit: 2021-07-28 | Discharge: 2021-07-28 | Disposition: A | Discharge status: Home / Self Care | Payer: BC Managed Care – PPO | Source: Ambulatory Visit | Attending: Radiation Oncology | Admitting: Radiation Oncology

## 2021-07-28 DIAGNOSIS — C50412 Malignant neoplasm of upper-outer quadrant of left female breast: Secondary | ICD-10-CM | POA: Diagnosis not present

## 2021-07-28 LAB — RAD ONC ARIA SESSION SUMMARY
Course Elapsed Days: 12
Plan Fractions Treated to Date: 9
Plan Prescribed Dose Per Fraction: 2.66 Gy
Plan Total Fractions Prescribed: 16
Plan Total Prescribed Dose: 42.56 Gy
Reference Point Dosage Given to Date: 23.94 Gy
Reference Point Session Dosage Given: 2.66 Gy
Session Number: 9

## 2021-07-29 ENCOUNTER — Other Ambulatory Visit: Payer: Self-pay

## 2021-07-29 ENCOUNTER — Ambulatory Visit
Admission: RE | Admit: 2021-07-29 | Discharge: 2021-07-29 | Disposition: A | Discharge status: Home / Self Care | Payer: BC Managed Care – PPO | Source: Ambulatory Visit | Attending: Radiation Oncology | Admitting: Radiation Oncology

## 2021-07-29 DIAGNOSIS — C50412 Malignant neoplasm of upper-outer quadrant of left female breast: Secondary | ICD-10-CM | POA: Diagnosis not present

## 2021-07-29 LAB — RAD ONC ARIA SESSION SUMMARY
Course Elapsed Days: 13
Plan Fractions Treated to Date: 10
Plan Prescribed Dose Per Fraction: 2.66 Gy
Plan Total Fractions Prescribed: 16
Plan Total Prescribed Dose: 42.56 Gy
Reference Point Dosage Given to Date: 26.6 Gy
Reference Point Session Dosage Given: 2.66 Gy
Session Number: 10

## 2021-07-30 ENCOUNTER — Other Ambulatory Visit: Payer: Self-pay

## 2021-07-30 ENCOUNTER — Ambulatory Visit
Admission: RE | Admit: 2021-07-30 | Discharge: 2021-07-30 | Disposition: A | Discharge status: Home / Self Care | Payer: BC Managed Care – PPO | Source: Ambulatory Visit | Attending: Radiation Oncology | Admitting: Radiation Oncology

## 2021-07-30 DIAGNOSIS — C50412 Malignant neoplasm of upper-outer quadrant of left female breast: Secondary | ICD-10-CM | POA: Diagnosis not present

## 2021-07-30 LAB — RAD ONC ARIA SESSION SUMMARY
Course Elapsed Days: 14
Plan Fractions Treated to Date: 11
Plan Prescribed Dose Per Fraction: 2.66 Gy
Plan Total Fractions Prescribed: 16
Plan Total Prescribed Dose: 42.56 Gy
Reference Point Dosage Given to Date: 29.26 Gy
Reference Point Session Dosage Given: 2.66 Gy
Session Number: 11

## 2021-07-31 ENCOUNTER — Other Ambulatory Visit: Payer: Self-pay

## 2021-07-31 ENCOUNTER — Ambulatory Visit
Admission: RE | Admit: 2021-07-31 | Discharge: 2021-07-31 | Disposition: A | Discharge status: Home / Self Care | Payer: BC Managed Care – PPO | Source: Ambulatory Visit | Attending: Radiation Oncology | Admitting: Radiation Oncology

## 2021-07-31 ENCOUNTER — Ambulatory Visit: Payer: BC Managed Care – PPO | Admitting: Radiation Oncology

## 2021-07-31 ENCOUNTER — Telehealth: Payer: Self-pay | Admitting: *Deleted

## 2021-07-31 DIAGNOSIS — C50412 Malignant neoplasm of upper-outer quadrant of left female breast: Secondary | ICD-10-CM | POA: Diagnosis not present

## 2021-07-31 LAB — RAD ONC ARIA SESSION SUMMARY
Course Elapsed Days: 15
Plan Fractions Treated to Date: 12
Plan Prescribed Dose Per Fraction: 2.66 Gy
Plan Total Fractions Prescribed: 16
Plan Total Prescribed Dose: 42.56 Gy
Reference Point Dosage Given to Date: 31.92 Gy
Reference Point Session Dosage Given: 2.66 Gy
Session Number: 12

## 2021-07-31 NOTE — Telephone Encounter (Signed)
Connected with Osker Mason in main entry lobby per Registrar who reports patient has questions regarding disability letter received and needs to know what to do to continue leave.  "June 11, 2021 was my last day of work.  Out per surgeon.  [employer] [place] claim [id number] letter reads to expect benefit check effective 06/19/2021 through 07/10/2021.  Radiation started 07/16/2021 and is to continue through 08/21/2021.  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  Surgeon has met requirement of leave needed for surgery.  New claim likely required by radiation oncology specialty.        Awaiting form if needed from [facility].  Currently no further questions or needs.

## 2021-08-03 ENCOUNTER — Encounter (INDEPENDENT_AMBULATORY_CARE_PROVIDER_SITE_OTHER): Payer: Self-pay | Admitting: Adult Health

## 2021-08-03 ENCOUNTER — Other Ambulatory Visit: Payer: Self-pay

## 2021-08-03 ENCOUNTER — Ambulatory Visit
Admission: RE | Admit: 2021-08-03 | Discharge: 2021-08-03 | Disposition: A | Discharge status: Home / Self Care | Payer: BC Managed Care – PPO | Source: Ambulatory Visit | Attending: Radiation Oncology | Admitting: Radiation Oncology

## 2021-08-03 ENCOUNTER — Ambulatory Visit (INDEPENDENT_AMBULATORY_CARE_PROVIDER_SITE_OTHER): Payer: BC Managed Care – PPO | Admitting: Adult Health

## 2021-08-03 VITALS — BP 152/76 | HR 86 | Temp 97.9°F | Ht 62.0 in | Wt 184.0 lb

## 2021-08-03 DIAGNOSIS — E1159 Type 2 diabetes mellitus with other circulatory complications: Secondary | ICD-10-CM

## 2021-08-03 DIAGNOSIS — E669 Obesity, unspecified: Secondary | ICD-10-CM

## 2021-08-03 DIAGNOSIS — E1169 Type 2 diabetes mellitus with other specified complication: Secondary | ICD-10-CM | POA: Diagnosis not present

## 2021-08-03 DIAGNOSIS — Z7985 Long-term (current) use of injectable non-insulin antidiabetic drugs: Secondary | ICD-10-CM

## 2021-08-03 DIAGNOSIS — F3289 Other specified depressive episodes: Secondary | ICD-10-CM

## 2021-08-03 DIAGNOSIS — I152 Hypertension secondary to endocrine disorders: Secondary | ICD-10-CM

## 2021-08-03 DIAGNOSIS — E559 Vitamin D deficiency, unspecified: Secondary | ICD-10-CM

## 2021-08-03 DIAGNOSIS — Z6833 Body mass index (BMI) 33.0-33.9, adult: Secondary | ICD-10-CM

## 2021-08-03 DIAGNOSIS — C50412 Malignant neoplasm of upper-outer quadrant of left female breast: Secondary | ICD-10-CM | POA: Diagnosis not present

## 2021-08-03 DIAGNOSIS — Z170 Estrogen receptor positive status [ER+]: Secondary | ICD-10-CM

## 2021-08-03 DIAGNOSIS — Z9189 Other specified personal risk factors, not elsewhere classified: Secondary | ICD-10-CM

## 2021-08-03 LAB — RAD ONC ARIA SESSION SUMMARY
Course Elapsed Days: 18
Plan Fractions Treated to Date: 13
Plan Prescribed Dose Per Fraction: 2.66 Gy
Plan Total Fractions Prescribed: 16
Plan Total Prescribed Dose: 42.56 Gy
Reference Point Dosage Given to Date: 34.58 Gy
Reference Point Session Dosage Given: 2.66 Gy
Session Number: 13

## 2021-08-03 MED ORDER — WEGOVY 2.4 MG/0.75ML ~~LOC~~ SOAJ: 2.4000 mg | SUBCUTANEOUS | 0 refills | Status: DC

## 2021-08-03 MED ORDER — BUPROPION HCL ER (SR) 200 MG PO TB12: 200.0000 mg | ORAL_TABLET | Freq: Every day | ORAL | 0 refills | Status: DC

## 2021-08-03 MED ORDER — VITAMIN D (ERGOCALCIFEROL) 1.25 MG (50000 UNIT) PO CAPS: 50000.0000 [IU] | ORAL_CAPSULE | ORAL | 0 refills | Status: DC

## 2021-08-03 MED ORDER — AMLODIPINE BESYLATE 10 MG PO TABS: 10.0000 mg | ORAL_TABLET | Freq: Every day | ORAL | 0 refills | Status: DC

## 2021-08-04 ENCOUNTER — Other Ambulatory Visit: Payer: Self-pay

## 2021-08-04 ENCOUNTER — Ambulatory Visit
Admission: RE | Admit: 2021-08-04 | Discharge: 2021-08-04 | Disposition: A | Discharge status: Home / Self Care | Payer: BC Managed Care – PPO | Source: Ambulatory Visit | Attending: Radiation Oncology | Admitting: Radiation Oncology

## 2021-08-04 DIAGNOSIS — Z9189 Other specified personal risk factors, not elsewhere classified: Secondary | ICD-10-CM | POA: Insufficient documentation

## 2021-08-04 DIAGNOSIS — C50412 Malignant neoplasm of upper-outer quadrant of left female breast: Secondary | ICD-10-CM | POA: Diagnosis not present

## 2021-08-04 LAB — RAD ONC ARIA SESSION SUMMARY
Course Elapsed Days: 19
Plan Fractions Treated to Date: 14
Plan Prescribed Dose Per Fraction: 2.66 Gy
Plan Total Fractions Prescribed: 16
Plan Total Prescribed Dose: 42.56 Gy
Reference Point Dosage Given to Date: 37.24 Gy
Reference Point Session Dosage Given: 2.66 Gy
Session Number: 14

## 2021-08-04 NOTE — Progress Notes (Signed)
Chief Complaint:   OBESITY Katrina Beard is here to discuss her progress with her obesity treatment plan along with follow-up of her obesity related diagnoses. Katrina Beard is on keeping a food journal and adhering to recommended goals of 1400 calories and 90 protein and portion control and states she is following her eating plan approximately 65% of the time. Katrina Beard states she is not exercising.    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  Katrina Beard will complete 20 rounds of radiation therapy on Aug 12, 2021.  Subjective:   1. Type 2 diabetes mellitus with other specified complication, without long-term current use of insulin (HCC) Missed Wegovy 2.4 mg dose last Tuesday 07/28/2021.  2. Hypertension associated with type 2 diabetes mellitus (HCC) Blood pressure 152/72, HR 86. She is on Norvasc '10mg'$  QD and Diovan '320mg'$  QD.Marland Kitchen  Epic review, Systolic Blood pressure 096-045, Diastolic blood pressures 40-98 He denies acute cardiac sx's.  3. Vitamin D deficiency 06/05/2021, vitamin D level stable at 49. She is on Ergocalciferol- denies N/V/muscle weakness.   4. Malignant neoplasm of upper-outer quadrant of left breast in female, estrogen receptor positive ([place]) Simrah will complete 20 rounds of radiation therapy on Aug 12, 2021.  5. Other depression, with emotional eating Cortnee reports stable mood, she denies any suicidal or homicidal ideations. She is on Buproprion SR 200 mg daily.  6. At risk for activity intolerance Katrina Beard is at risk of exercise intolerance due to obesity and chronic knee pain, and radiation therapy for breast cancer.    Assessment/Plan:   1. Type 2 diabetes mellitus with other  specified complication, without long-term current use of insulin (HCC) Refill Wegovy 2.4 mg once weekly dispense 3 mL no refills. See below.  - Semaglutide-Weight Management (WEGOVY) 2.4 MG/0.75ML SOAJ; Inject 2.4 mg into the skin once a week.  Dispense: 3 mL; Refill: 0  2. Hypertension associated with type 2 diabetes mellitus (HCC) Refill Amlodipine 10 mg once daily, dispense 30 tabs no refills.  See below.  - amLODipine (NORVASC) 10 MG tablet; Take 1 tablet (10 mg total) by mouth daily.  Dispense: 30 tablet; Refill: 0  3. Vitamin D deficiency Refill Vitamin D, 50,000 IU once weekly dispense #4 no refills.  See below.   - Vitamin D, Ergocalciferol, (DRISDOL) 1.25 MG (50000 UNIT) CAPS capsule; Take 1 capsule (50,000 Units total) by mouth every 7 (seven) days.  Dispense: 4 capsule; Refill: 0  4. Malignant neoplasm of upper-outer quadrant of left breast in female, estrogen receptor positive ([place]) Katrina Beard has agreed to increase protein intake. Complete radiation therapy.  Follow up with specialist as directed.  5. Other depression, with emotional eating Refill Buproprion Sr 200 mg daily dispense 30 tablets, no refills.  See below.   - buPROPion (WELLBUTRIN SR) 200 MG 12 hr tablet; Take 1 tablet (200 mg total) by mouth daily.  Dispense: 30 tablet; Refill: 0  6. At risk for activity intolerance Katrina Beard was given approximately 15 minutes of exercise intolerance counseling today. She is 61 y.o. female and has risk factors exercise intolerance including obesity. We discussed intensive lifestyle modifications today with an emphasis on specific weight loss instructions and strategies. Katrina Beard will slowly increase activity as tolerated.  Repetitive  spaced learning was employed today to elicit superior memory formation and behavioral change.   7. Obesity with current BMI of 33.7 Refill Wegovy 2.4 mg once weekly dispense 3 mL no refills.  See below.   - Semaglutide-Weight Management (WEGOVY) 2.4  MG/0.75ML SOAJ; Inject 2.4 mg into the skin once a week.  Dispense: 3 mL; Refill: 0  Katrina Beard is currently in the action stage of change. As such, her goal is to continue with weight loss efforts. She has agreed to keeping a food journal and adhering to recommended goals of 1400 calories and 80-90 protein daily.   Exercise goals:  Katrina Beard has agreed to increase her daily walking as tolerated.   Behavioral modification strategies: increasing lean protein intake, decreasing simple carbohydrates, meal planning and cooking strategies, keeping healthy foods in the home, and planning for success.  Katrina Beard has agreed to follow-up with our clinic in 4 weeks. She was informed of the importance of frequent follow-up visits to maximize her success with intensive lifestyle modifications for her multiple health conditions.   Objective:   Blood pressure (!) 152/76, pulse 86, temperature 97.9 F (36.6 C), height '5\' 2"'$  (1.575 m), weight 184 lb (83.5 kg), SpO2 (!) 86 %. Body mass index is 33.65 kg/m.  General: Cooperative, alert, well developed, in no acute distress. HEENT: Conjunctivae and lids unremarkable. Cardiovascular: Regular rhythm.  Lungs: Normal work of breathing. Neurologic: No focal deficits.   Lab Results  Component Value Date   CREATININE 1.26 (H) 06/05/2021   BUN 14 06/05/2021   NA 139 06/05/2021   K 3.5 06/05/2021   CL 104 06/05/2021   CO2 28 06/05/2021   Lab Results  Component Value Date   ALT 19 11/13/2020   AST 48 (A) 04/15/2021   ALKPHOS 92 04/15/2021   BILITOT 0.5 11/13/2020   Lab Results  Component Value Date   HGBA1C 5.5 04/15/2021   HGBA1C 5.5 11/13/2020   HGBA1C 6.0 (H) 06/24/2020   HGBA1C 5.9 (H) 01/08/2020   HGBA1C 6.3 (H) 08/06/2019   Lab Results  Component Value Date   INSULIN 11.2 04/03/2019   Lab Results  Component Value Date   TSH 0.575 04/03/2019   Lab Results  Component Value Date   CHOL 161 04/15/2021   HDL 55 04/15/2021   LDLCALC 91 04/15/2021    TRIG 80 04/15/2021   Lab Results  Component Value Date   VD25OH 48.4 11/13/2020   VD25OH 44.4 06/24/2020   VD25OH 55.2 01/08/2020   Lab Results  Component Value Date   WBC 6.5 04/15/2021   HGB 13.0 04/15/2021   HCT 39 04/15/2021   MCV 86 06/24/2020   PLT 279 04/15/2021   Lab Results  Component Value Date   IRON 162 04/15/2021   TIBC 252 06/24/2020   FERRITIN 64 06/24/2020    Attestation Statements:   Reviewed by clinician on day of visit: allergies, medications, problem list, medical history, surgical history, family history, social history, and previous encounter notes.  I, Alcus Dad, am acting as Location manager for Mina Marble, NP.  I have reviewed the above documentation for accuracy and completeness, and I agree with the above. -  Ivon Oelkers d. Danne Scardina, NP-C

## 2021-08-05 ENCOUNTER — Other Ambulatory Visit: Payer: Self-pay

## 2021-08-05 ENCOUNTER — Ambulatory Visit
Admission: RE | Admit: 2021-08-05 | Discharge: 2021-08-05 | Disposition: A | Discharge status: Home / Self Care | Payer: BC Managed Care – PPO | Source: Ambulatory Visit | Attending: Radiation Oncology | Admitting: Radiation Oncology

## 2021-08-05 DIAGNOSIS — C50412 Malignant neoplasm of upper-outer quadrant of left female breast: Secondary | ICD-10-CM | POA: Diagnosis not present

## 2021-08-05 LAB — RAD ONC ARIA SESSION SUMMARY
Course Elapsed Days: 20
Plan Fractions Treated to Date: 15
Plan Prescribed Dose Per Fraction: 2.66 Gy
Plan Total Fractions Prescribed: 16
Plan Total Prescribed Dose: 42.56 Gy
Reference Point Dosage Given to Date: 39.9 Gy
Reference Point Session Dosage Given: 2.66 Gy
Session Number: 15

## 2021-08-06 ENCOUNTER — Inpatient Hospital Stay: Payer: BC Managed Care – PPO | Admitting: Hematology and Oncology

## 2021-08-06 ENCOUNTER — Encounter: Payer: Self-pay | Admitting: Hematology and Oncology

## 2021-08-06 ENCOUNTER — Ambulatory Visit
Admission: RE | Admit: 2021-08-06 | Discharge: 2021-08-06 | Disposition: A | Discharge status: Home / Self Care | Payer: BC Managed Care – PPO | Source: Ambulatory Visit | Attending: Radiation Oncology | Admitting: Radiation Oncology

## 2021-08-06 ENCOUNTER — Other Ambulatory Visit: Payer: Self-pay

## 2021-08-06 DIAGNOSIS — I2510 Atherosclerotic heart disease of native coronary artery without angina pectoris: Secondary | ICD-10-CM | POA: Insufficient documentation

## 2021-08-06 DIAGNOSIS — Z79899 Other long term (current) drug therapy: Secondary | ICD-10-CM | POA: Insufficient documentation

## 2021-08-06 DIAGNOSIS — Z170 Estrogen receptor positive status [ER+]: Secondary | ICD-10-CM

## 2021-08-06 DIAGNOSIS — C50412 Malignant neoplasm of upper-outer quadrant of left female breast: Secondary | ICD-10-CM

## 2021-08-06 DIAGNOSIS — E119 Type 2 diabetes mellitus without complications: Secondary | ICD-10-CM | POA: Insufficient documentation

## 2021-08-06 DIAGNOSIS — E669 Obesity, unspecified: Secondary | ICD-10-CM | POA: Insufficient documentation

## 2021-08-06 DIAGNOSIS — D0512 Intraductal carcinoma in situ of left breast: Secondary | ICD-10-CM | POA: Insufficient documentation

## 2021-08-06 LAB — RAD ONC ARIA SESSION SUMMARY
Course Elapsed Days: 21
Plan Fractions Treated to Date: 16
Plan Prescribed Dose Per Fraction: 2.66 Gy
Plan Total Fractions Prescribed: 16
Plan Total Prescribed Dose: 42.56 Gy
Reference Point Dosage Given to Date: 42.56 Gy
Reference Point Session Dosage Given: 2.66 Gy
Session Number: 16

## 2021-08-06 MED ORDER — TAMOXIFEN CITRATE 20 MG PO TABS: 20.0000 mg | ORAL_TABLET | Freq: Every day | ORAL | 3 refills | Status: DC

## 2021-08-06 NOTE — Progress Notes (Signed)
[facility] CONSULT NOTE  Patient Care Team: Jonathon Jordan, MD as PCP - General (Family Medicine) Mauro Kaufmann, RN as Oncology Nurse Navigator Rockwell Germany, RN as Oncology Nurse Navigator  CHIEF COMPLAINTS/PURPOSE OF CONSULTATION:  DCIS left breast  ASSESSMENT & PLAN:  Malignant neoplasm of upper-outer quadrant of left breast in female, estrogen receptor positive [facility]) This is a very pleasant 61 year old female patient with past medical history significant for coronary artery disease, diabetes, obesity referred to hematology and medical oncology for new diagnosis of DCIS left breast.  During her initial visit we have discussed about proceeding with surgery followed by adjuvant radiation and antiestrogen therapy.  She mentioned of having ongoing menstrual cycles as we have discussed about tamoxifen.  She is here to follow-up since she is nearing completion of adjuvant radiation.  She has 4 more days left.  Since she is still premenopausal we have once again discussed about tamoxifen, mechanism of action, adverse effects of tamoxifen including but not limited to postmenopausal symptoms, vaginal wetness, small risk of DVT/PE, endometrial cancer.  A benefit from tamoxifen in addition to decreased risk of recurrence would be improvement in bone density She is currently on bupropion for food cravings which interacts with tamoxifen and decreases the effectiveness of tamoxifen.  She was recommended to talk to her physician if this can be changed to another medication.  In the meantime prescription has been dispensed to the pharmacy of her choice.  She will start tamoxifen and return to clinic in about 3 to 4 months for toxicity check.  No orders of the defined types were placed in this encounter.  HISTORY OF PRESENTING ILLNESS:   VELORA HORSTMAN 61 y.o. female is here because of DCIS left breast.  Jan 2023 showed calcifications in the left breast needing further evaluation She had  diagnostic mammogram of left breast which showed indeterminate regional calcifications involving the upper outer quadrant of the left breast spanning at least 12 cm, recommend to site stereotactic biopsy of calcifications to exclude malignancy. She had left breast biopsy which showed DCIS with calcifications in the upper outer anterior calcifications.  Upper outer posterior calcifications showed complex sclerosing lesion with atypia and calcifications.   Prognostics from DCIS showed 100%, positive, strong staining intensity, progesterone receptor, 70%, positive, strong staining intensity. She had left breast lumpectomy which showed intermediate grade DCIS measuring 1.5 cm, margins uninvolved.  Complex sclerosing lesion with intraductal papilloma and usual ductal hyperplasia also noted. She is now post adjuvant radiation and is here for follow-up.  Interval history  She has been tolerating adjuvant radiation very well.  She has 4 more days left of adjuvant radiation.  She continues to have menstrual cycle, last cycle was about a month ago.  She denies any other health symptoms today.  She is working on weight loss regimen.  She continues on bupropion for food cravings.  Rest of the pertinent 10 point ROS reviewed and negative  REVIEW OF SYSTEMS:   Constitutional: Denies fevers, chills or abnormal night sweats Eyes: Denies blurriness of vision, double vision or watery eyes Ears, nose, mouth, throat, and face: Denies mucositis or sore throat Respiratory: Denies cough, dyspnea or wheezes Cardiovascular: Denies palpitation, chest discomfort or lower extremity swelling Gastrointestinal:  Denies nausea, heartburn or change in bowel habits Skin: Denies abnormal skin rashes Lymphatics: Denies new lymphadenopathy or easy bruising Neurological:Denies numbness, tingling or new weaknesses Behavioral/Psych: Mood is stable, no new changes  All other systems were reviewed with the  patient and are  negative.  MEDICAL HISTORY:  Past Medical History:  Diagnosis Date   Asthma    Breast cancer ([place])    CAD (coronary artery disease)    CHF (congestive heart failure) (HCC)    Diabetes mellitus    Gallbladder problem    GERD (gastroesophageal reflux disease)    HLD (hyperlipidemia)    Hypertension    Joint pain    Kidney stone    Knee pain    MI, old 2003   no stent placement   Vitamin D deficiency     SURGICAL HISTORY: Past Surgical History:  Procedure Laterality Date   BREAST EXCISIONAL BIOPSY Bilateral    BREAST LUMPECTOMY WITH RADIOACTIVE SEED LOCALIZATION Left 06/11/2021   Procedure: LEFT BREAST LUMPECTOMY WITH RADIOACTIVE SEED LOCALIZATION X2;  Surgeon: Erroll Luna, MD;  Location: [facility];  Service: General;  Laterality: Left;   BREAST SURGERY     CHOLECYSTECTOMY     CYSTOSCOPY W/ URETERAL STENT PLACEMENT Right 07/25/2016   Procedure: CYSTOSCOPY WITH RETROGRADE PYELOGRAM/LEFT URETERAL STENT PLACEMENT;  Surgeon: Ardis Hughs, MD;  Location: [facility];  Service: Urology;  Laterality: Right;   HAND SURGERY     ROUX-EN-Y GASTRIC BYPASS     07/15/2014    SOCIAL HISTORY: Social History   Socioeconomic History   Marital status: Single    Spouse name: Not on file   Number of children: Not on file   Years of education: Not on file   Highest education level: Not on file  Occupational History   Occupation: Teacher, early years/pre  Tobacco Use   Smoking status: Never   Smokeless tobacco: Never  Vaping Use   Vaping Use: Never used  Substance and Sexual Activity   Alcohol use: No   Drug use: No   Sexual activity: Not on file  Other Topics Concern   Not on file  Social History Narrative   Not on file   Social Determinants of Health   Financial Resource Strain: Not on file  Food Insecurity: Not on file  Transportation Needs: Not on file  Physical Activity: Not on file  Stress: Not on file  Social Connections: Not on file  Intimate Partner  Violence: Not on file    FAMILY HISTORY: Family History  Problem Relation Age of Onset   Breast cancer Mother 10   Hypertension Mother    Hyperlipidemia Mother    Obesity Mother    Hypertension Father    Heart disease Father     ALLERGIES:  is allergic to ibuprofen and lisinopril-hydrochlorothiazide.  MEDICATIONS:  Current Outpatient Medications  Medication Sig Dispense Refill   tamoxifen (NOLVADEX) 20 MG tablet Take 1 tablet (20 mg total) by mouth daily. 30 tablet 3   amLODipine (NORVASC) 10 MG tablet Take 1 tablet (10 mg total) by mouth daily. 30 tablet 0   aspirin EC 81 MG tablet Take 81 mg by mouth daily.     atorvastatin (LIPITOR) 20 MG tablet Take 20 mg by mouth daily.     blood glucose meter kit and supplies KIT Dispense based on patient and insurance preference. Use up to four times daily as directed. 1 each 0   buPROPion (WELLBUTRIN SR) 200 MG 12 hr tablet Take 1 tablet (200 mg total) by mouth daily. 30 tablet 0   ferrous sulfate (FERROUSUL) 325 (65 FE) MG tablet Take 1 tablet (325 mg total) by mouth daily with breakfast. 30 tablet 0   fluticasone (FLONASE) 50 MCG/ACT  nasal spray Place 1 spray into both nostrils in the morning and at bedtime. 16 g 2   Multiple Vitamins-Iron (MULTIVITAMIN/IRON PO) Take by mouth.     oxyCODONE (OXY IR/ROXICODONE) 5 MG immediate release tablet Take 1 tablet (5 mg total) by mouth every 6 (six) hours as needed for severe pain. 15 tablet 0   Semaglutide-Weight Management (WEGOVY) 2.4 MG/0.75ML SOAJ Inject 2.4 mg into the skin once a week. 3 mL 0   valsartan (DIOVAN) 320 MG tablet Take 320 mg by mouth daily.     Vitamin D, Ergocalciferol, (DRISDOL) 1.25 MG (50000 UNIT) CAPS capsule Take 1 capsule (50,000 Units total) by mouth every 7 (seven) days. 4 capsule 0   No current facility-administered medications for this visit.     PHYSICAL EXAMINATION: ECOG PERFORMANCE STATUS: 0 - Asymptomatic  Vitals:   08/06/21 0931  BP: 132/63  Pulse: 65   Resp: 16  Temp: 97.8 F (36.6 C)  SpO2: 100%   Filed Weights   08/06/21 0931  Weight: 186 lb (84.4 kg)    Physical exam deferred in lieu of counseling   LABORATORY DATA:  I have reviewed the data as listed Lab Results  Component Value Date   WBC 6.5 04/15/2021   HGB 13.0 04/15/2021   HCT 39 04/15/2021   MCV 86 06/24/2020   PLT 279 04/15/2021     Chemistry      Component Value Date/Time   NA 139 06/05/2021 1256   NA 140 04/15/2021 0000   K 3.5 06/05/2021 1256   CL 104 06/05/2021 1256   CO2 28 06/05/2021 1256   BUN 14 06/05/2021 1256   BUN 16 04/15/2021 0000   CREATININE 1.26 (H) 06/05/2021 1256   GLU 71 04/15/2021 0000      Component Value Date/Time   CALCIUM 8.9 06/05/2021 1256   ALKPHOS 92 04/15/2021 0000   AST 48 (A) 04/15/2021 0000   ALT 19 11/13/2020 1127   BILITOT 0.5 11/13/2020 1127       RADIOGRAPHIC STUDIES: I have personally reviewed the radiological images as listed and agreed with the findings in the report. No results found.  All questions were answered. The patient knows to call the clinic with any problems, questions or concerns. I spent 20 minutes in the care of this patient including H and P, review of records, counseling and coordination of care.    Benay Pike, MD 08/06/2021 10:08 AM

## 2021-08-06 NOTE — Assessment & Plan Note (Addendum)
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  During her initial visit we have discussed about proceeding with surgery followed by adjuvant radiation and antiestrogen therapy.  She mentioned of having ongoing menstrual cycles as we have discussed about tamoxifen.  She is here to follow-up since she is nearing completion of adjuvant radiation.  She has 4 more days left.  Since she is still premenopausal we have once again discussed about tamoxifen, mechanism of action, adverse effects of tamoxifen including but not limited to postmenopausal symptoms, vaginal wetness, small risk of DVT/PE, endometrial cancer.  A benefit from tamoxifen in addition to decreased risk of recurrence would be improvement in bone density She is currently on bupropion for food cravings which interacts with tamoxifen and decreases the effectiveness of tamoxifen.  She was recommended to talk to her physician if this can be changed to another medication.  In the meantime prescription has been dispensed to the pharmacy of her choice.  She will start tamoxifen and return to clinic in about 3 to 4 months for toxicity check.

## 2021-08-07 ENCOUNTER — Other Ambulatory Visit: Payer: Self-pay

## 2021-08-07 ENCOUNTER — Ambulatory Visit
Admission: RE | Admit: 2021-08-07 | Discharge: 2021-08-07 | Disposition: A | Discharge status: Home / Self Care | Payer: BC Managed Care – PPO | Source: Ambulatory Visit | Attending: Radiation Oncology | Admitting: Radiation Oncology

## 2021-08-07 DIAGNOSIS — C50412 Malignant neoplasm of upper-outer quadrant of left female breast: Secondary | ICD-10-CM | POA: Diagnosis not present

## 2021-08-07 LAB — RAD ONC ARIA SESSION SUMMARY
Course Elapsed Days: 22
Plan Fractions Treated to Date: 1
Plan Prescribed Dose Per Fraction: 2 Gy
Plan Total Fractions Prescribed: 4
Plan Total Prescribed Dose: 8 Gy
Reference Point Dosage Given to Date: 44.56 Gy
Reference Point Session Dosage Given: 2 Gy
Session Number: 17

## 2021-08-11 ENCOUNTER — Other Ambulatory Visit: Payer: Self-pay

## 2021-08-11 ENCOUNTER — Ambulatory Visit
Admission: RE | Admit: 2021-08-11 | Discharge: 2021-08-11 | Disposition: A | Discharge status: Home / Self Care | Payer: BC Managed Care – PPO | Source: Ambulatory Visit | Attending: Radiation Oncology | Admitting: Radiation Oncology

## 2021-08-11 DIAGNOSIS — C50412 Malignant neoplasm of upper-outer quadrant of left female breast: Secondary | ICD-10-CM | POA: Diagnosis not present

## 2021-08-11 LAB — RAD ONC ARIA SESSION SUMMARY
Course Elapsed Days: 26
Plan Fractions Treated to Date: 2
Plan Prescribed Dose Per Fraction: 2 Gy
Plan Total Fractions Prescribed: 4
Plan Total Prescribed Dose: 8 Gy
Reference Point Dosage Given to Date: 46.56 Gy
Reference Point Session Dosage Given: 2 Gy
Session Number: 18

## 2021-08-11 NOTE — Progress Notes (Signed)
                                                                                                                                                             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 She developed anticipated skin changes in the treatment field.   Plan: The patient will receive a call in about one month from the radiation oncology department. She will continue follow up with Dr. Chryl Heck as well.    ________________________________________________    Carola Rhine, [facility]

## 2021-08-12 ENCOUNTER — Other Ambulatory Visit: Payer: Self-pay

## 2021-08-12 ENCOUNTER — Encounter: Payer: Self-pay | Admitting: *Deleted

## 2021-08-12 ENCOUNTER — Ambulatory Visit
Admission: RE | Admit: 2021-08-12 | Discharge: 2021-08-12 | Disposition: A | Discharge status: Home / Self Care | Payer: BC Managed Care – PPO | Source: Ambulatory Visit | Attending: Radiation Oncology | Admitting: Radiation Oncology

## 2021-08-12 DIAGNOSIS — Z170 Estrogen receptor positive status [ER+]: Secondary | ICD-10-CM

## 2021-08-12 DIAGNOSIS — C50412 Malignant neoplasm of upper-outer quadrant of left female breast: Secondary | ICD-10-CM | POA: Diagnosis not present

## 2021-08-12 LAB — RAD ONC ARIA SESSION SUMMARY
Course Elapsed Days: 27
Plan Fractions Treated to Date: 3
Plan Prescribed Dose Per Fraction: 2 Gy
Plan Total Fractions Prescribed: 4
Plan Total Prescribed Dose: 8 Gy
Reference Point Dosage Given to Date: 48.56 Gy
Reference Point Session Dosage Given: 2 Gy
Session Number: 19

## 2021-08-13 ENCOUNTER — Ambulatory Visit: Payer: BC Managed Care – PPO

## 2021-08-13 ENCOUNTER — Encounter: Payer: Self-pay | Admitting: Radiation Oncology

## 2021-08-14 ENCOUNTER — Other Ambulatory Visit: Payer: Self-pay

## 2021-08-14 ENCOUNTER — Ambulatory Visit
Admission: RE | Admit: 2021-08-14 | Discharge: 2021-08-14 | Disposition: A | Discharge status: Home / Self Care | Payer: BC Managed Care – PPO | Source: Ambulatory Visit | Attending: Radiation Oncology | Admitting: Radiation Oncology

## 2021-08-14 DIAGNOSIS — C50412 Malignant neoplasm of upper-outer quadrant of left female breast: Secondary | ICD-10-CM | POA: Diagnosis present

## 2021-08-14 DIAGNOSIS — Z170 Estrogen receptor positive status [ER+]: Secondary | ICD-10-CM | POA: Diagnosis present

## 2021-08-14 LAB — RAD ONC ARIA SESSION SUMMARY
Course Elapsed Days: 29
Plan Fractions Treated to Date: 4
Plan Prescribed Dose Per Fraction: 2 Gy
Plan Total Fractions Prescribed: 4
Plan Total Prescribed Dose: 8 Gy
Reference Point Dosage Given to Date: 50.56 Gy
Reference Point Session Dosage Given: 2 Gy
Session Number: 20

## 2021-08-21 ENCOUNTER — Telehealth: Payer: Self-pay

## 2021-08-21 NOTE — Telephone Encounter (Signed)
Notified Patient of completion of Forms 703 and 7A for [place] Disability Review. Fax transmission confirmation received. Copy of forms mailed to Patient as requested. No other needs or concerns voiced at this time.

## 2021-08-28 ENCOUNTER — Telehealth: Payer: Self-pay | Admitting: *Deleted

## 2021-08-28 ENCOUNTER — Encounter: Payer: Self-pay | Admitting: Hematology and Oncology

## 2021-08-28 NOTE — Telephone Encounter (Signed)
Spoke with pt about survivorship program with Mendel Ryder, NP. Pt is interested and scheduling will call with time and date.

## 2021-08-31 ENCOUNTER — Telehealth: Payer: Self-pay | Admitting: Adult Health

## 2021-08-31 NOTE — Telephone Encounter (Signed)
Scheduled per 6/16 in basket, pt has been called and confirmed  

## 2021-09-01 ENCOUNTER — Encounter (INDEPENDENT_AMBULATORY_CARE_PROVIDER_SITE_OTHER): Payer: Self-pay | Admitting: Adult Health

## 2021-09-01 ENCOUNTER — Ambulatory Visit (INDEPENDENT_AMBULATORY_CARE_PROVIDER_SITE_OTHER): Payer: BC Managed Care – PPO | Admitting: Adult Health

## 2021-09-01 DIAGNOSIS — Z6841 Body Mass Index (BMI) 40.0 and over, adult: Secondary | ICD-10-CM

## 2021-09-01 DIAGNOSIS — Z6833 Body mass index (BMI) 33.0-33.9, adult: Secondary | ICD-10-CM

## 2021-09-01 DIAGNOSIS — E1159 Type 2 diabetes mellitus with other circulatory complications: Secondary | ICD-10-CM

## 2021-09-01 DIAGNOSIS — E669 Obesity, unspecified: Secondary | ICD-10-CM

## 2021-09-01 DIAGNOSIS — F3289 Other specified depressive episodes: Secondary | ICD-10-CM

## 2021-09-01 DIAGNOSIS — E1169 Type 2 diabetes mellitus with other specified complication: Secondary | ICD-10-CM

## 2021-09-01 DIAGNOSIS — E559 Vitamin D deficiency, unspecified: Secondary | ICD-10-CM

## 2021-09-01 DIAGNOSIS — D509 Iron deficiency anemia, unspecified: Secondary | ICD-10-CM | POA: Diagnosis not present

## 2021-09-01 DIAGNOSIS — Z7985 Long-term (current) use of injectable non-insulin antidiabetic drugs: Secondary | ICD-10-CM

## 2021-09-01 DIAGNOSIS — I152 Hypertension secondary to endocrine disorders: Secondary | ICD-10-CM

## 2021-09-01 MED ORDER — AMLODIPINE BESYLATE 10 MG PO TABS: 10.0000 mg | ORAL_TABLET | Freq: Every day | ORAL | 0 refills | Status: DC

## 2021-09-01 MED ORDER — VITAMIN D (ERGOCALCIFEROL) 1.25 MG (50000 UNIT) PO CAPS: 50000.0000 [IU] | ORAL_CAPSULE | ORAL | 0 refills | Status: DC

## 2021-09-01 MED ORDER — WEGOVY 2.4 MG/0.75ML ~~LOC~~ SOAJ: 2.4000 mg | SUBCUTANEOUS | 0 refills | Status: DC

## 2021-09-01 MED ORDER — FERROUS SULFATE 325 (65 FE) MG PO TABS: 325.0000 mg | ORAL_TABLET | Freq: Every day | ORAL | 0 refills | Status: AC

## 2021-09-02 ENCOUNTER — Other Ambulatory Visit (INDEPENDENT_AMBULATORY_CARE_PROVIDER_SITE_OTHER): Payer: Self-pay | Admitting: Adult Health

## 2021-09-02 DIAGNOSIS — E559 Vitamin D deficiency, unspecified: Secondary | ICD-10-CM

## 2021-09-02 NOTE — Progress Notes (Signed)
Chief Complaint:   OBESITY Katrina Beard is here to discuss her progress with her obesity treatment plan along with follow-up of her obesity related diagnoses. Katrina Beard is on keeping a food journal and adhering to recommended goals of 1400 calories and 90 gms protein and practicing portion control and making smarter food choices, such as increasing vegetables and decreasing simple carbohydrates and states she is following her eating plan approximately 75% of the time. Katrina Beard states she is not currently exercising.  Today's visit was #: 69 Starting weight: 231 lbs Starting date: 04/03/2019 Today's weight: 177 lbs Today's date: 09/01/21 Total lbs lost to date: 54 lbs Total lbs lost since last in-office visit: -7  Interim History:  08/06/2021 Oncology Assessment & Plan: This is a very pleasant 61 year old female patient with past medical history significant for coronary artery disease, diabetes, obesity referred to hematology and medical oncology for new diagnosis of DCIS left breast.  During her initial visit we have discussed about proceeding with surgery followed by adjuvant radiation and antiestrogen therapy.  She mentioned of having ongoing menstrual cycles as we have discussed about tamoxifen.  She is here to follow-up since she is nearing completion of adjuvant radiation.  She has 4 more days left.  Since she is still premenopausal we have once again discussed about tamoxifen, mechanism of action, adverse effects of tamoxifen including but not limited to postmenopausal symptoms, vaginal wetness, small risk of DVT/PE, endometrial cancer.  A benefit from tamoxifen in addition to decreased risk of recurrence would be improvement in bone density She is currently on bupropion for food cravings which interacts with tamoxifen and decreases the effectiveness of tamoxifen.  She was recommended to talk to her physician if this can be changed to another medication.  In the meantime prescription has been dispensed  to the pharmacy of her choice.  She will start tamoxifen and return to clinic in about 3 to 4 months for toxicity check.  She stopped bupropion SR 200 mg on 08/30/2021.  Subjective:   1. Hypertension associated with type 2 diabetes mellitus (HCC) BP/heart rate excellent at this visit. She is on amlodipine 10 mg once daily, valsartan 320 mg once daily.  2. Iron deficiency anemia, unspecified iron deficiency anemia type She denies constipation with daily ferrous sulfate 325 mg.  3. Type 2 diabetes mellitus with other specified complication, without long-term current use of insulin ([place]) 08/30/19- started on Ozempic 0.'25mg'$  once week. Currently on Wegovy 2.'4mg'$  once week. She denies mass in neck, dysphagia, dyspepsia, persistent hoarseness, abd pain, or N/V/Constipation. She stopped metformin on 05/14/2021.   She is not checking blood glucose at home. She denies sx's of hypoglycemia.  4. Vitamin D deficiency She is inconsistently taking ergocalciferol-denies nausea/vomiting/muscle weakness.  5. Other depression, with emotional eating Oncology advises to stop bupropion SR 200 mg once daily due to interaction with tamoxifen and decreases the effectiveness of tamoxifen.  She has been off bupropion since 08/30/2021.  Assessment/Plan:   1. Hypertension associated with type 2 diabetes mellitus (HCC) Refill - amLODipine (NORVASC) 10 MG tablet; Take 1 tablet (10 mg total) by mouth daily.  Dispense: 30 tablet; Refill: 0  2. Iron deficiency anemia, unspecified iron deficiency anemia type Refill - ferrous sulfate (FERROUSUL) 325 (65 FE) MG tablet; Take 1 tablet (325 mg total) by mouth daily with breakfast.  Dispense: 30 tablet; Refill: 0  3. Type 2 diabetes mellitus with other specified complication, without long-term current use of insulin ([place]) Check labs at next office  visit.  Refill - Semaglutide-Weight Management (WEGOVY) 2.4 MG/0.75ML SOAJ; Inject 2.4 mg into the skin once a week.  Dispense: 3  mL; Refill: 0  4. Vitamin D deficiency Check labs at next office visit.  Refill - Vitamin D, Ergocalciferol, (DRISDOL) 1.25 MG (50000 UNIT) CAPS capsule; Take 1 capsule (50,000 Units total) by mouth every 7 (seven) days.  Dispense: 4 capsule; Refill: 0  5. Other depression, with emotional eating Remain off bupropion.  6. Obesity with current BMI of 33.7 Fasting labs next office visit.  Katrina Beard is currently in the action stage of change. As such, her goal is to continue with weight loss efforts. She has agreed to practicing portion control and making smarter food choices, such as increasing vegetables and decreasing simple carbohydrates.   Exercise goals: Increase daily walking.  Behavioral modification strategies: meal planning and cooking strategies, keeping healthy foods in the home, and planning for success.  Katrina Beard has agreed to follow-up with our clinic in 4 weeks, fasting. She was informed of the importance of frequent follow-up visits to maximize her success with intensive lifestyle modifications for her multiple health conditions.    Objective:   Blood pressure 111/73, pulse 80, temperature 97.9 F (36.6 C), height '5\' 2"'$  (1.575 m), weight 177 lb (80.3 kg), SpO2 100 %. Body mass index is 32.37 kg/m.  General: Cooperative, alert, well developed, in no acute distress. HEENT: Conjunctivae and lids unremarkable. Cardiovascular: Regular rhythm.  Lungs: Normal work of breathing. Neurologic: No focal deficits.   Lab Results  Component Value Date   CREATININE 1.26 (H) 06/05/2021   BUN 14 06/05/2021   NA 139 06/05/2021   K 3.5 06/05/2021   CL 104 06/05/2021   CO2 28 06/05/2021   Lab Results  Component Value Date   ALT 19 11/13/2020   AST 48 (A) 04/15/2021   ALKPHOS 92 04/15/2021   BILITOT 0.5 11/13/2020   Lab Results  Component Value Date   HGBA1C 5.5 04/15/2021   HGBA1C 5.5 11/13/2020   HGBA1C 6.0 (H) 06/24/2020   HGBA1C 5.9 (H) 01/08/2020   HGBA1C 6.3 (H)  08/06/2019   Lab Results  Component Value Date   INSULIN 11.2 04/03/2019   Lab Results  Component Value Date   TSH 0.575 04/03/2019   Lab Results  Component Value Date   CHOL 161 04/15/2021   HDL 55 04/15/2021   LDLCALC 91 04/15/2021   TRIG 80 04/15/2021   Lab Results  Component Value Date   VD25OH 48.4 11/13/2020   VD25OH 44.4 06/24/2020   VD25OH 55.2 01/08/2020   Lab Results  Component Value Date   WBC 6.5 04/15/2021   HGB 13.0 04/15/2021   HCT 39 04/15/2021   MCV 86 06/24/2020   PLT 279 04/15/2021   Lab Results  Component Value Date   IRON 162 04/15/2021   TIBC 252 06/24/2020   FERRITIN 64 06/24/2020     Attestation Statements:   Reviewed by clinician on day of visit: allergies, medications, problem list, medical history, surgical history, family history, social history, and previous encounter notes.  I, Georgianne Fick, FNP, am acting as Location manager for Mina Marble, NP.  I have reviewed the above documentation for accuracy and completeness, and I agree with the above. -  Treyvone Chelf d. Micholas Drumwright, NP-C

## 2021-09-02 NOTE — Telephone Encounter (Signed)
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 [employer] paperwork received 07/15/2021 completed 07/21/2021.  [place] tracker does not indicate record release.  Question 14 asks for objective findings.   [employer] [employer] [employer] 7A and 703 paperwork received 08/14/2021, completed 08/21/2021.  [place] tracker does not indicate record release.  Form 7A, page 1 near bottom reads "You must provide documentation of diagnosis".  Status checked by this nurse examination of (SW) H.I.M. releases for this patient.        On 07/31/2021, this nurse viewed [employer] letter dated 07/23/2021 mailed to patient reporting records not yet received for claim no.# [id number].  *See 07/31/2021 telephone note by this nurse. Today this nurse faxed record requests described above with signed [facility] authorizations to radiation oncology [phone]) and (SW) H.I.M. [id number]) to process records.   [employer] representative, Raquel Sarna confirms no records have been received for this claim.   Shared above information with Raquel Sarna.  [place] forms staff awaiting form received 7A and 703 only.  No record request noted with May 2023 [employer] form noted in EMR.  Advised to expect records from (SW) H.I.M. Department within thirty days or less. Currently no questions or further needs.

## 2021-09-02 NOTE — Telephone Encounter (Addendum)
Connected with Katrina Beard [phone] (home) , notified of records release information.  Apologized for delay.   During call expressed "started tamoxifen 09/01/2021.  This may be too strong for me.  Will give it a few days before I call Dr. Chryl Heck; my body may have to adjust to it.  Experiencing dizziness.  During an appointment yesterday my B/P = 111/73.  Has never been that low.  No B/P checked today."

## 2021-09-10 ENCOUNTER — Other Ambulatory Visit (INDEPENDENT_AMBULATORY_CARE_PROVIDER_SITE_OTHER): Payer: Self-pay | Admitting: Adult Health

## 2021-09-10 DIAGNOSIS — E559 Vitamin D deficiency, unspecified: Secondary | ICD-10-CM

## 2021-09-11 ENCOUNTER — Encounter (INDEPENDENT_AMBULATORY_CARE_PROVIDER_SITE_OTHER): Payer: Self-pay | Admitting: Adult Health

## 2021-09-11 ENCOUNTER — Encounter: Payer: Self-pay | Admitting: Hematology and Oncology

## 2021-09-16 ENCOUNTER — Telehealth: Payer: Self-pay | Admitting: *Deleted

## 2021-09-16 NOTE — Telephone Encounter (Signed)
[employer] [employer] Disability/FMLA paperwork completed by this nurse today currently placed in designated mail bin for collaborative pick up.  Awaiting provider review and signature.

## 2021-09-17 NOTE — Telephone Encounter (Signed)
Form e-mailed to [email or web address].  Original mailed to patient address on file. [address] [place] [address]

## 2021-09-22 ENCOUNTER — Other Ambulatory Visit (INDEPENDENT_AMBULATORY_CARE_PROVIDER_SITE_OTHER): Payer: Self-pay | Admitting: Adult Health

## 2021-09-22 DIAGNOSIS — I152 Hypertension secondary to endocrine disorders: Secondary | ICD-10-CM

## 2021-09-22 DIAGNOSIS — E1159 Type 2 diabetes mellitus with other circulatory complications: Secondary | ICD-10-CM

## 2021-09-28 ENCOUNTER — Ambulatory Visit
Admission: RE | Admit: 2021-09-28 | Discharge: 2021-09-28 | Disposition: A | Discharge status: Home / Self Care | Payer: BC Managed Care – PPO | Source: Ambulatory Visit | Attending: Adult Health | Admitting: Adult Health

## 2021-09-28 DIAGNOSIS — C50412 Malignant neoplasm of upper-outer quadrant of left female breast: Secondary | ICD-10-CM

## 2021-09-28 NOTE — Progress Notes (Signed)
  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 She developed anticipated skin changes in the treatment field. She's had additional seroma and is working with Dr. Brantley Stage with this. She sees him again on 10/09/21   Impression/Plan: 1. 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 The patient has been doing well since completion of radiotherapy. We discussed that we would be happy to continue to follow her as needed, but she will also continue to follow up with Dr. Chryl Heck in medical oncology and Dr. Brantley Stage for her infection. She was counseled on skin care as well as measures to avoid sun exposure to this area.         Carola Rhine, PAC

## 2021-10-01 ENCOUNTER — Telehealth: Payer: Self-pay | Admitting: Adult Health

## 2021-10-01 ENCOUNTER — Inpatient Hospital Stay: Payer: BC Managed Care – PPO | Attending: Adult Health | Admitting: Adult Health

## 2021-10-01 ENCOUNTER — Telehealth: Payer: Self-pay | Admitting: *Deleted

## 2021-10-01 ENCOUNTER — Other Ambulatory Visit: Payer: Self-pay

## 2021-10-01 ENCOUNTER — Ambulatory Visit (INDEPENDENT_AMBULATORY_CARE_PROVIDER_SITE_OTHER): Payer: BC Managed Care – PPO | Admitting: Adult Health

## 2021-10-01 ENCOUNTER — Encounter: Payer: Self-pay | Admitting: Adult Health

## 2021-10-01 ENCOUNTER — Encounter (INDEPENDENT_AMBULATORY_CARE_PROVIDER_SITE_OTHER): Payer: Self-pay | Admitting: Adult Health

## 2021-10-01 VITALS — BP 106/66 | HR 78 | Temp 98.1°F | Resp 18 | Ht 62.0 in | Wt 185.9 lb

## 2021-10-01 VITALS — BP 108/70 | HR 85 | Temp 97.9°F | Ht 62.0 in | Wt 182.0 lb

## 2021-10-01 DIAGNOSIS — E669 Obesity, unspecified: Secondary | ICD-10-CM

## 2021-10-01 DIAGNOSIS — Z9189 Other specified personal risk factors, not elsewhere classified: Secondary | ICD-10-CM | POA: Insufficient documentation

## 2021-10-01 DIAGNOSIS — Z923 Personal history of irradiation: Secondary | ICD-10-CM | POA: Diagnosis not present

## 2021-10-01 DIAGNOSIS — E1159 Type 2 diabetes mellitus with other circulatory complications: Secondary | ICD-10-CM

## 2021-10-01 DIAGNOSIS — E1169 Type 2 diabetes mellitus with other specified complication: Secondary | ICD-10-CM

## 2021-10-01 DIAGNOSIS — Z79899 Other long term (current) drug therapy: Secondary | ICD-10-CM | POA: Diagnosis not present

## 2021-10-01 DIAGNOSIS — E559 Vitamin D deficiency, unspecified: Secondary | ICD-10-CM

## 2021-10-01 DIAGNOSIS — Z6841 Body Mass Index (BMI) 40.0 and over, adult: Secondary | ICD-10-CM

## 2021-10-01 DIAGNOSIS — J329 Chronic sinusitis, unspecified: Secondary | ICD-10-CM | POA: Insufficient documentation

## 2021-10-01 DIAGNOSIS — D0512 Intraductal carcinoma in situ of left breast: Secondary | ICD-10-CM | POA: Diagnosis present

## 2021-10-01 DIAGNOSIS — J4520 Mild intermittent asthma, uncomplicated: Secondary | ICD-10-CM | POA: Insufficient documentation

## 2021-10-01 DIAGNOSIS — G479 Sleep disorder, unspecified: Secondary | ICD-10-CM | POA: Insufficient documentation

## 2021-10-01 DIAGNOSIS — Z6834 Body mass index (BMI) 34.0-34.9, adult: Secondary | ICD-10-CM | POA: Insufficient documentation

## 2021-10-01 DIAGNOSIS — I152 Hypertension secondary to endocrine disorders: Secondary | ICD-10-CM

## 2021-10-01 DIAGNOSIS — E1121 Type 2 diabetes mellitus with diabetic nephropathy: Secondary | ICD-10-CM | POA: Insufficient documentation

## 2021-10-01 DIAGNOSIS — Z170 Estrogen receptor positive status [ER+]: Secondary | ICD-10-CM | POA: Diagnosis not present

## 2021-10-01 DIAGNOSIS — E1165 Type 2 diabetes mellitus with hyperglycemia: Secondary | ICD-10-CM | POA: Insufficient documentation

## 2021-10-01 DIAGNOSIS — C50412 Malignant neoplasm of upper-outer quadrant of left female breast: Secondary | ICD-10-CM | POA: Diagnosis not present

## 2021-10-01 DIAGNOSIS — Z6833 Body mass index (BMI) 33.0-33.9, adult: Secondary | ICD-10-CM

## 2021-10-01 DIAGNOSIS — F411 Generalized anxiety disorder: Secondary | ICD-10-CM | POA: Insufficient documentation

## 2021-10-01 MED ORDER — VITAMIN D (ERGOCALCIFEROL) 1.25 MG (50000 UNIT) PO CAPS: 50000.0000 [IU] | ORAL_CAPSULE | ORAL | 0 refills | Status: DC

## 2021-10-01 MED ORDER — WEGOVY 2.4 MG/0.75ML ~~LOC~~ SOAJ: 2.4000 mg | SUBCUTANEOUS | 0 refills | Status: DC

## 2021-10-01 MED ORDER — AMLODIPINE BESYLATE 10 MG PO TABS: 10.0000 mg | ORAL_TABLET | Freq: Every day | ORAL | 0 refills | Status: DC

## 2021-10-01 NOTE — Telephone Encounter (Signed)
Scheduled appointment per 7/20 los. Patient is aware.

## 2021-10-01 NOTE — Telephone Encounter (Signed)
Per request by provider - scheduled doppler for Friday 7/21 at 1pm at [facility] location.  This RN called pt and informed of above time and location with pt verbalizing understanding.

## 2021-10-01 NOTE — Progress Notes (Signed)
SURVIVORSHIP VISIT:  BRIEF ONCOLOGIC HISTORY:  Oncology History  Malignant neoplasm of upper-outer quadrant of left breast in female, estrogen receptor positive ([place])  05/15/2021 Initial Diagnosis   Left breast UOQ biopsy: DCIS with calcifications in the upper outer anterior calcifications.  ER 100%/PR70% Upper outer posterior biopsy: complex sclerosing lesion with atypia and calcifications.      06/11/2021 Surgery   Left lumpectomy: DCIS 1.5cm, margins negative, CSL with intraductal papilloma noted.   06/11/2021 Cancer Staging   Staging form: Breast, AJCC 8th Edition - Pathologic stage from 06/11/2021: Stage 0 (pTis (DCIS), pN0, cM0, ER+, PR+) - Signed by Gardenia Phlegm, NP on 10/01/2021   06/15/2021 Genetic Testing   CustomNext+RNA cancer panel found no pathogenic mutations.    The CustomNext-Cancer+RNAinsight panel offered by [facility] includes sequencing and rearrangement analysis for the following 47 genes:  APC, ATM, AXIN2, BARD1, BMPR1A, BRCA1, BRCA2, BRIP1, CDH1, CDK4, CDKN2A, CHEK2, DICER1, EPCAM, GREM1, HOXB13, MEN1, MLH1, MSH2, MSH3, MSH6, MUTYH, NBN, NF1, NF2, NTHL1, PALB2, PMS2, POLD1, POLE, PTEN, RAD51C, RAD51D, RECQL, RET, SDHA, SDHAF2, SDHB, SDHC, SDHD, SMAD4, SMARCA4, STK11, TP53, TSC1, TSC2, and VHL.  RNA data is routinely analyzed for use in variant interpretation for all genes.    07/16/2021 - 08/14/2021 Radiation Therapy   Site Technique Total Dose (Gy) Dose per Fx (Gy) Completed Fx Beam Energies  Breast, Left: Breast_L 3D 42.56/42.56 2.66 16/16 10X  Breast, Left: Breast_L_Bst 3D 8/8 2 4/4 10X     08/2021 -  Anti-estrogen oral therapy   Tamoxifen daily     INTERVAL HISTORY:  Katrina Beard to review her survivorship care plan detailing her treatment course for breast cancer, as well as monitoring long-term side effects of that treatment, education regarding health maintenance, screening, and overall wellness and health promotion.     Overall, [name] reports  feeling quite well.  She is taking Tamoxifen daily with good tolerance.    REVIEW OF SYSTEMS:  Review of Systems  Constitutional:  Negative for appetite change, chills, fatigue, fever and unexpected weight change.  HENT:   Negative for hearing loss, lump/mass and trouble swallowing.   Eyes:  Negative for eye problems and icterus.  Respiratory:  Negative for chest tightness, cough and shortness of breath.   Cardiovascular:  Negative for chest pain, leg swelling and palpitations.  Gastrointestinal:  Negative for abdominal distention, abdominal pain, constipation, diarrhea, nausea and vomiting.  Endocrine: Negative for hot flashes.  Genitourinary:  Negative for difficulty urinating.   Musculoskeletal:  Negative for arthralgias.  Skin:  Negative for itching and rash.  Neurological:  Negative for dizziness, extremity weakness, headaches and numbness.  Hematological:  Negative for adenopathy. Does not bruise/bleed easily.  Psychiatric/Behavioral:  Negative for depression. The patient is not nervous/anxious.   Breast: Denies any new nodularity, masses, tenderness, nipple changes, or nipple discharge.    ONCOLOGY TREATMENT TEAM:  1. Surgeon:  Dr. Brantley Stage at [facility] 2. Medical Oncologist: Dr. Chryl Heck 3. Radiation Oncologist: Dr. Lisbeth Renshaw    PAST MEDICAL/SURGICAL HISTORY:  Past Medical History:  Diagnosis Date   Asthma    Breast cancer ([place])    CAD (coronary artery disease)    CHF (congestive heart failure) (HCC)    Diabetes mellitus    Gallbladder problem    GERD (gastroesophageal reflux disease)    HLD (hyperlipidemia)    Hypertension    Joint pain    Kidney stone    Knee pain    MI, old 2003   no stent  placement   Vitamin D deficiency    Past Surgical History:  Procedure Laterality Date   BREAST EXCISIONAL BIOPSY Bilateral    BREAST LUMPECTOMY WITH RADIOACTIVE SEED LOCALIZATION Left 06/11/2021   Procedure: LEFT BREAST LUMPECTOMY WITH RADIOACTIVE SEED LOCALIZATION  X2;  Surgeon: Erroll Luna, MD;  Location: [facility];  Service: General;  Laterality: Left;   BREAST SURGERY     CHOLECYSTECTOMY     CYSTOSCOPY W/ URETERAL STENT PLACEMENT Right 07/25/2016   Procedure: CYSTOSCOPY WITH RETROGRADE PYELOGRAM/LEFT URETERAL STENT PLACEMENT;  Surgeon: Ardis Hughs, MD;  Location: [facility];  Service: Urology;  Laterality: Right;   HAND SURGERY     ROUX-EN-Y GASTRIC BYPASS     07/15/2014     ALLERGIES:  Allergies  Allergen Reactions   Ibuprofen Other (See Comments)    Gastric bypass surgery-can't take anymore   Lisinopril-Hydrochlorothiazide Other (See Comments)    Arm numbness and tongue blisters     CURRENT MEDICATIONS:  Outpatient Encounter Medications as of 10/01/2021  Medication Sig   amLODipine (NORVASC) 10 MG tablet Take 1 tablet (10 mg total) by mouth daily.   aspirin EC 81 MG tablet Take 81 mg by mouth daily.   atorvastatin (LIPITOR) 20 MG tablet Take 20 mg by mouth daily.   blood glucose meter kit and supplies KIT Dispense based on patient and insurance preference. Use up to four times daily as directed.   ferrous sulfate (FERROUSUL) 325 (65 FE) MG tablet Take 1 tablet (325 mg total) by mouth daily with breakfast.   fluticasone (FLONASE) 50 MCG/ACT nasal spray Place 1 spray into both nostrils in the morning and at bedtime.   Multiple Vitamins-Iron (MULTIVITAMIN/IRON PO) Take by mouth.   Semaglutide-Weight Management (WEGOVY) 2.4 MG/0.75ML SOAJ Inject 2.4 mg into the skin once a week.   tamoxifen (NOLVADEX) 20 MG tablet Take 1 tablet (20 mg total) by mouth daily.   valsartan (DIOVAN) 320 MG tablet Take 320 mg by mouth daily.   Vitamin D, Ergocalciferol, (DRISDOL) 1.25 MG (50000 UNIT) CAPS capsule Take 1 capsule (50,000 Units total) by mouth every 7 (seven) days.   [DISCONTINUED] oxyCODONE (OXY IR/ROXICODONE) 5 MG immediate release tablet Take 1 tablet (5 mg total) by mouth every 6 (six) hours as needed for severe pain. (Patient  not taking: Reported on 10/01/2021)   No facility-administered encounter medications on file as of 10/01/2021.     ONCOLOGIC FAMILY HISTORY:  Family History  Problem Relation Age of Onset   Breast cancer Mother 32   Hypertension Mother    Hyperlipidemia Mother    Obesity Mother    Hypertension Father    Heart disease Father      SOCIAL HISTORY:  Social History   Socioeconomic History   Marital status: Single    Spouse name: Not on file   Number of children: Not on file   Years of education: Not on file   Highest education level: Not on file  Occupational History   Occupation: Teacher, early years/pre  Tobacco Use   Smoking status: Never   Smokeless tobacco: Never  Vaping Use   Vaping Use: Never used  Substance and Sexual Activity   Alcohol use: No   Drug use: No   Sexual activity: Not on file  Other Topics Concern   Not on file  Social History Narrative   Not on file   Social Determinants of Health   Financial Resource Strain: Not on file  Food Insecurity: Not on file  Transportation Needs: Not on file  Physical Activity: Not on file  Stress: Not on file  Social Connections: Not on file  Intimate Partner Violence: Not on file     OBSERVATIONS/OBJECTIVE:  BP 106/66 (BP Location: Left Arm, Patient Position: Sitting)   Pulse 78   Temp 98.1 F (36.7 C) (Tympanic)   Resp 18   Ht 5' 2"  (1.575 m)   Wt 185 lb 14.4 oz (84.3 kg)   SpO2 100%   BMI 34.00 kg/m  GENERAL: Patient is a well appearing female in no acute distress HEENT:  Sclerae anicteric.  Oropharynx clear and moist. No ulcerations or evidence of oropharyngeal candidiasis. Neck is supple.  NODES:  No cervical, supraclavicular, or axillary lymphadenopathy palpated.  BREAST EXAM:  left breast s/p lumpectomy and radiation, no sign of local recurrence, very large seroma noted in left breast, right breast benign LUNGS:  Clear to auscultation bilaterally.  No wheezes or rhonchi. HEART:  Regular rate and rhythm.  No murmur appreciated. ABDOMEN:  Soft, nontender.  Positive, normoactive bowel sounds. No organomegaly palpated. MSK:  No focal spinal tenderness to palpation. Full range of motion bilaterally in the upper extremities. EXTREMITIES:  + left leg edema. SKIN:  Clear with no obvious rashes or skin changes. No nail dyscrasia. NEURO:  Nonfocal. Well oriented.  Appropriate affect.   LABORATORY DATA:  None for this visit.  DIAGNOSTIC IMAGING:  None for this visit.    ASSESSMENT AND PLAN:  Ms.. Beard is a pleasant 61 y.o. female with Stage 0 left DCIS, ER+/PR+, diagnosed in 04/2021, treated with lumpectomy, adjuvant radiation therapy, and anti-estrogen therapy with Tamoxifen beginning in 08/2021.  She presents to the Survivorship Clinic for our initial meeting and routine follow-up post-completion of treatment for breast cancer.    1. Stage 0 left breast cancer:  Ms. Ferrebee is continuing to recover from definitive treatment for breast cancer. She will follow-up with her medical oncologist, Dr. Chryl Heck in 6 months with history and physical exam per surveillance protocol.  She will continue her anti-estrogen therapy with Tamoxifen. Thus far, she is tolerating the Tamoxifen well, with minimal side effects.  Her mammogram is due 03/2022; orders placed today.   Today, a comprehensive survivorship care plan and treatment summary was reviewed with the patient today detailing her breast cancer diagnosis, treatment course, potential late/long-term effects of treatment, appropriate follow-up care with recommendations for the future, and patient education resources.  A copy of this summary, along with a letter will be sent to the patient's primary care provider via mail/fax/In Basket message after today's visit.    2. Bone health:  She was given education on specific activities to promote bone health.  3. Cancer screening:  Due to Ms. Kiner's history and her age, she should receive screening for skin cancers, colon  cancer, and gynecologic cancers.  The information and recommendations are listed on the patient's comprehensive care plan/treatment summary and were reviewed in detail with the patient.    4. Health maintenance and wellness promotion: Ms. Neuroth was encouraged to consume 5-7 servings of fruits and vegetables per day. We reviewed the "Nutrition Rainbow" handout.  She was also encouraged to engage in moderate to vigorous exercise for 30 minutes per day most days of the week. We discussed the [employer] fitness program, which is designed for cancer survivors to help them become more physically fit after cancer treatments.  She was instructed to limit her alcohol consumption and continue to abstain from tobacco use.  5. Support services/counseling: It is not uncommon for this period of the patient's cancer care trajectory to be one of many emotions and stressors.   She was given information regarding our available services and encouraged to contact me with any questions or for help enrolling in any of our support group/programs.   6. Left leg swelling: Doppler ordered to r/o DVT.     Follow up instructions:    -Return to cancer center in 6 months  -Mammogram due in 03/2022 -Follow up with surgery 1 year -She is welcome to return back to the Survivorship Clinic at any time; no additional follow-up needed at this time.  -Consider referral back to survivorship as a long-term survivor for continued surveillance  The patient was provided an opportunity to ask questions and all were answered. The patient agreed with the plan and demonstrated an understanding of the instructions.   Total encounter time:40 minutes*in face-to-face visit time, chart review, lab review, care coordination, order entry, and documentation of the encounter time.  Wilber Bihari, NP 10/01/21 10:24 AM Medical Oncology and Hematology [facility] [place], [place] [address] Tel. [phone]     Fax. [phone]  *Total Encounter Time as defined by the Centers for Medicare and Medicaid Services includes, in addition to the face-to-face time of a patient visit (documented in the note above) non-face-to-face time: obtaining and reviewing outside history, ordering and reviewing medications, tests or procedures, care coordination (communications with other health care professionals or caregivers) and documentation in the medical record.

## 2021-10-02 ENCOUNTER — Ambulatory Visit (HOSPITAL_COMMUNITY)
Admission: RE | Admit: 2021-10-02 | Discharge: 2021-10-02 | Disposition: A | Discharge status: Home / Self Care | Payer: BC Managed Care – PPO | Source: Ambulatory Visit | Attending: Vascular Surgery | Admitting: Vascular Surgery

## 2021-10-02 DIAGNOSIS — C50412 Malignant neoplasm of upper-outer quadrant of left female breast: Secondary | ICD-10-CM | POA: Insufficient documentation

## 2021-10-02 DIAGNOSIS — Z170 Estrogen receptor positive status [ER+]: Secondary | ICD-10-CM | POA: Diagnosis not present

## 2021-10-02 LAB — COMPREHENSIVE METABOLIC PANEL
ALT: 24 IU/L (ref 0–32)
AST: 19 IU/L (ref 0–40)
Albumin/Globulin Ratio: 1.3 (ref 1.2–2.2)
Albumin: 4 g/dL (ref 3.8–4.9)
Alkaline Phosphatase: 100 IU/L (ref 44–121)
BUN/Creatinine Ratio: 13 (ref 9–23)
BUN: 14 mg/dL (ref 6–24)
Bilirubin Total: 0.6 mg/dL (ref 0.0–1.2)
CO2: 24 mmol/L (ref 20–29)
Calcium: 9.2 mg/dL (ref 8.7–10.2)
Chloride: 103 mmol/L (ref 96–106)
Creatinine, Ser: 1.06 mg/dL — ABNORMAL HIGH (ref 0.57–1.00)
Globulin, Total: 3 g/dL (ref 1.5–4.5)
Glucose: 87 mg/dL (ref 70–99)
Potassium: 3.9 mmol/L (ref 3.5–5.2)
Sodium: 140 mmol/L (ref 134–144)
Total Protein: 7 g/dL (ref 6.0–8.5)
eGFR: 61 mL/min/{1.73_m2} (ref >59)

## 2021-10-02 LAB — INSULIN, RANDOM: INSULIN: 6.1 u[IU]/mL (ref 2.6–24.9)

## 2021-10-02 LAB — HEMOGLOBIN A1C
Est. average glucose Bld gHb Est-mCnc: 103 mg/dL
Hgb A1c MFr Bld: 5.2 % (ref 4.8–5.6)

## 2021-10-02 LAB — VITAMIN D 25 HYDROXY (VIT D DEFICIENCY, FRACTURES): Vit D, 25-Hydroxy: 49.1 ng/mL (ref 30.0–100.0)

## 2021-10-06 NOTE — Progress Notes (Signed)
Chief Complaint:   OBESITY Katrina Beard is here to discuss her progress with her obesity treatment plan along with follow-up of her obesity related diagnoses. Katrina Beard is on practicing portion control and making smarter food choices, such as increasing vegetables and decreasing simple carbohydrates and states she is following her eating plan approximately 75% of the time. Katrina Beard states she is walking for 60 minutes 2 times per week.  Today's visit was #: 50 Starting weight: 231 lbs Starting date: 04/03/2019 Today's weight: 182 lbs Today's date: 10/01/2021 Total lbs lost to date: 49 Total lbs lost since last in-office visit: 0  Interim History:  Katrina Beard has 34 yr old foster child in her care. Home life is strained/tense due to foster child's oppositional behavior. She is hoping to have this child, rehomed in in the next 10 days.   She will except a 81 year old foster child once the 49 year old leaves.    Reviewed Bioimpedance with pt: Muscle mass +7 lbs Adipose mass -1.8 lbs Water weight +5.2 lbs.  Subjective:   1. Hypertension associated with type 2 diabetes mellitus (Katrina Beard) Katrina Beard is not checking her home blood pressures regularly.   She is currently on amlodipine 10 mg once daily, and she endorses lower extremity edema.   Upon Epic review- last two systolic blood pressure reading: 108-110.   She denies symptoms of hypotension.  2. Type 2 diabetes mellitus with other specified complication, without long-term current use of insulin (Katrina Beard) 08/30/19- started on Ozempic 0.'25mg'$  once week. Currently on Wegovy 2.'4mg'$  once week. She denies mass in neck, dysphagia, dyspepsia, persistent hoarseness, abd pain, or N/V/Constipation. She stopped metformin on 05/14/2021.   She is not checking blood glucose at home. She denies sx's of hypoglycemia.  3. Vitamin D deficiency Katrina Beard is on Ergocalciferol 50,000 units once weekly, and she is tolerating it well.  4. At risk for osteoporosis Katrina Beard is at  higher risk of osteopenia and osteoporosis due to Vitamin D deficiency and obesity.   Assessment/Plan:   1. Hypertension associated with type 2 diabetes mellitus (Katrina Beard) We will check labs today, and we will refill amlodipine 10 mg once daily for 1 month. Katrina Beard will consider decreasing CCB if her blood pressure continues to trend down.  - amLODipine (NORVASC) 10 MG tablet; Take 1 tablet (10 mg total) by mouth daily.  Dispense: 30 tablet; Refill: 0 - Comprehensive metabolic panel  2. Type 2 diabetes mellitus with other specified complication, without long-term current use of insulin ([facility]) We will check labs today.  Katrina Beard will continue Wegovy 2.4 mg once weekly, we will refill for 1 month.  - Semaglutide-Weight Management (WEGOVY) 2.4 MG/0.75ML SOAJ; Inject 2.4 mg into the skin once a week.  Dispense: 3 mL; Refill: 0 - Hemoglobin A1c - Insulin, random  3. Vitamin D deficiency We will check labs today. Katrina Beard will continue Ergo 50,000 units once weekly, and we will refill for 1 month.  - Vitamin D, Ergocalciferol, (DRISDOL) 1.25 MG (50000 UNIT) CAPS capsule; Take 1 capsule (50,000 Units total) by mouth every 7 (seven) days.  Dispense: 4 capsule; Refill: 0 - VITAMIN D 25 Hydroxy (Vit-D Deficiency, Fractures)  4. At risk for osteoporosis Katrina Beard was given approximately 15 minutes of osteoporosis prevention counseling today. Katrina Beard is at risk for osteopenia and osteoporosis due to her Vitamin D deficiency. She was encouraged to take her Vit D and follow her higher calcium diet and increase strengthening exercise to help strengthen her bones and decrease her risk of osteopenia and  osteoporosis.  5. Obesity with current BMI of 33.7 Katrina Beard is currently in the action stage of change. As such, her goal is to continue with weight loss efforts. She has agreed to practicing portion control and making smarter food choices, such as increasing vegetables and decreasing simple carbohydrates.   We discussed  various medication options to help Katrina Beard with her weight loss efforts and we both agreed to hybrid of Cat 3 and Journaling plan 1500 cal/100-120g protein.  - Semaglutide-Weight Management (WEGOVY) 2.4 MG/0.75ML SOAJ; Inject 2.4 mg into the skin once a week.  Dispense: 3 mL; Refill: 0  Exercise goals: As is.   Behavioral modification strategies: increasing lean protein intake, decreasing simple carbohydrates, meal planning and cooking strategies, keeping healthy foods in the home, and planning for success.  Katrina Beard has agreed to follow-up with our clinic in 4 weeks. She was informed of the importance of frequent follow-up visits to maximize her success with intensive lifestyle modifications for her multiple health conditions.   Objective:   Blood pressure 108/70, pulse 85, temperature 97.9 F (36.6 C), height '5\' 2"'$  (1.575 m), weight 182 lb (82.6 kg), SpO2 100 %. Body mass index is 33.29 kg/m.  General: Cooperative, alert, well developed, in no acute distress. HEENT: Conjunctivae and lids unremarkable. Cardiovascular: Regular rhythm.  Lungs: Normal work of breathing. Neurologic: No focal deficits.   Lab Results  Component Value Date   CREATININE 1.06 (H) 10/01/2021   BUN 14 10/01/2021   NA 140 10/01/2021   K 3.9 10/01/2021   CL 103 10/01/2021   CO2 24 10/01/2021   Lab Results  Component Value Date   ALT 24 10/01/2021   AST 19 10/01/2021   ALKPHOS 100 10/01/2021   BILITOT 0.6 10/01/2021   Lab Results  Component Value Date   HGBA1C 5.2 10/01/2021   HGBA1C 5.5 04/15/2021   HGBA1C 5.5 11/13/2020   HGBA1C 6.0 (H) 06/24/2020   HGBA1C 5.9 (H) 01/08/2020   Lab Results  Component Value Date   INSULIN 6.1 10/01/2021   INSULIN 11.2 04/03/2019   Lab Results  Component Value Date   TSH 0.575 04/03/2019   Lab Results  Component Value Date   CHOL 161 04/15/2021   HDL 55 04/15/2021   LDLCALC 91 04/15/2021   TRIG 80 04/15/2021   Lab Results  Component Value Date   VD25OH  49.1 10/01/2021   VD25OH 48.4 11/13/2020   VD25OH 44.4 06/24/2020   Lab Results  Component Value Date   WBC 6.5 04/15/2021   HGB 13.0 04/15/2021   HCT 39 04/15/2021   MCV 86 06/24/2020   PLT 279 04/15/2021   Lab Results  Component Value Date   IRON 162 04/15/2021   TIBC 252 06/24/2020   FERRITIN 64 06/24/2020   Attestation Statements:   Reviewed by clinician on day of visit: allergies, medications, problem list, medical history, surgical history, family history, social history, and previous encounter notes.   Wilhemena Durie, am acting as transcriptionist for Mina Marble, NP.  I have reviewed the above documentation for accuracy and completeness, and I agree with the above. -  Khalel Alms d. Yordin Rhoda, NP-C

## 2021-10-21 ENCOUNTER — Encounter (INDEPENDENT_AMBULATORY_CARE_PROVIDER_SITE_OTHER): Payer: Self-pay

## 2021-10-21 ENCOUNTER — Telehealth: Payer: Self-pay | Admitting: *Deleted

## 2021-10-21 NOTE — Telephone Encounter (Signed)
[place] Disability form 703 completed today by this nurse.  Reviewed and signed by provider.    Successfully returned via fax to [place].  Original mailed to patient address on file. [address] [id number] No further action needed or required by this nurse.  Process completes with copy to bin designated for [facility] staff to prepare for scanning office.

## 2021-10-25 ENCOUNTER — Other Ambulatory Visit (INDEPENDENT_AMBULATORY_CARE_PROVIDER_SITE_OTHER): Payer: Self-pay | Admitting: Adult Health

## 2021-10-25 DIAGNOSIS — I152 Hypertension secondary to endocrine disorders: Secondary | ICD-10-CM

## 2021-10-29 ENCOUNTER — Encounter (INDEPENDENT_AMBULATORY_CARE_PROVIDER_SITE_OTHER): Payer: Self-pay | Admitting: Adult Health

## 2021-10-29 ENCOUNTER — Ambulatory Visit (INDEPENDENT_AMBULATORY_CARE_PROVIDER_SITE_OTHER): Payer: BC Managed Care – PPO | Admitting: Adult Health

## 2021-10-29 DIAGNOSIS — E1159 Type 2 diabetes mellitus with other circulatory complications: Secondary | ICD-10-CM

## 2021-10-29 DIAGNOSIS — Z6833 Body mass index (BMI) 33.0-33.9, adult: Secondary | ICD-10-CM

## 2021-10-29 DIAGNOSIS — E1169 Type 2 diabetes mellitus with other specified complication: Secondary | ICD-10-CM | POA: Diagnosis not present

## 2021-10-29 DIAGNOSIS — I152 Hypertension secondary to endocrine disorders: Secondary | ICD-10-CM | POA: Diagnosis not present

## 2021-10-29 DIAGNOSIS — E559 Vitamin D deficiency, unspecified: Secondary | ICD-10-CM

## 2021-10-29 DIAGNOSIS — Z7985 Long-term (current) use of injectable non-insulin antidiabetic drugs: Secondary | ICD-10-CM

## 2021-10-29 DIAGNOSIS — E669 Obesity, unspecified: Secondary | ICD-10-CM

## 2021-10-29 MED ORDER — WEGOVY 2.4 MG/0.75ML ~~LOC~~ SOAJ: 2.4000 mg | SUBCUTANEOUS | 0 refills | Status: DC

## 2021-10-29 MED ORDER — AMLODIPINE BESYLATE 10 MG PO TABS: 10.0000 mg | ORAL_TABLET | Freq: Every day | ORAL | 0 refills | Status: DC

## 2021-10-29 MED ORDER — VITAMIN D (ERGOCALCIFEROL) 1.25 MG (50000 UNIT) PO CAPS: 50000.0000 [IU] | ORAL_CAPSULE | ORAL | 0 refills | Status: DC

## 2021-11-04 NOTE — Progress Notes (Unsigned)
Chief Complaint:   OBESITY Katrina Beard is here to discuss her progress with her obesity treatment plan along with follow-up of her obesity related diagnoses. Katrina Beard is on practicing portion control and making smarter food choices, such as increasing vegetables and decreasing simple carbohydrates and states she is following her eating plan approximately 0% of the time. Katrina Beard states she is not exercising.   Today's visit was #: 63 Starting weight: 231 lbs Starting date: 04/03/2019 Today's weight: 182 lbs Today's date: 10/29/2021 Total lbs lost to date: 49 lbs Total lbs lost since last in-office visit: +7  Interim History: 5 year old foster child has been removed from her house.  New 59 year old foster child is now with her.  She wants to eat out every meal.  She endorses polyphagia despite Wegovy 2.4 mg once weekly injection.   Subjective:   1. Hypertension associated with type 2 diabetes mellitus (Katrina Beard) Discussed labs with patient today. 10/01/2021 CMP, Creatinine 1.06, improvement from 1.26 on 03/24/023.  Blood pressure and heart rate at goal.   2. Vitamin D deficiency Discussed labs with patient today. 10/01/2021, Vitamin D level 49.1  3. Type 2 diabetes mellitus with other specified complication, without long-term current use of insulin (Katrina Beard) Discussed labs with patient today. 10/01/2021 A1c 5.2 with normal blood glucose of 87.  *** insulin level 6.1.  Assessment/Plan:   1. Hypertension associated with type 2 diabetes mellitus ([facility]) Refill - amLODipine (NORVASC) 10 MG tablet; Take 1 tablet (10 mg total) by mouth daily.  Dispense: 30 tablet; Refill: 0  2. Vitamin D deficiency Refill - Vitamin D, Ergocalciferol, (DRISDOL) 1.25 MG (50000 UNIT) CAPS capsule; Take 1 capsule (50,000 Units total) by mouth every 7 (seven) days.  Dispense: 4 capsule; Refill: 0  3. Type 2 diabetes mellitus with other specified complication, without long-term current use of insulin ([facility]) Refill -  Semaglutide-Weight Management (WEGOVY) 2.4 MG/0.75ML SOAJ; Inject 2.4 mg into the skin once a week.  Dispense: 3 mL; Refill: 0  4. Obesity with current BMI of 33.7 Refill - Semaglutide-Weight Management (WEGOVY) 2.4 MG/0.75ML SOAJ; Inject 2.4 mg into the skin once a week.  Dispense: 3 mL; Refill: 0  Katrina Beard is currently in the action stage of change. As such, her goal is to continue with weight loss efforts. She has agreed to following a lower carbohydrate, vegetable and lean protein rich diet plan.   Exercise goals:  As is.   Behavioral modification strategies: increasing lean protein intake, decreasing simple carbohydrates, meal planning and cooking strategies, keeping healthy foods in the home, and planning for success.  Katrina Beard has agreed to follow-up with our clinic in 4 weeks. She was informed of the importance of frequent follow-up visits to maximize her success with intensive lifestyle modifications for her multiple health conditions.   Objective:   Pulse 69, temperature 98.5 F (36.9 C), height '5\' 2"'$  (1.575 m), weight 189 lb (85.7 kg), SpO2 97 %. Body mass index is 34.57 kg/m.  General: Cooperative, alert, well developed, in no acute distress. HEENT: Conjunctivae and lids unremarkable. Cardiovascular: Regular rhythm.  Lungs: Normal work of breathing. Neurologic: No focal deficits.   Lab Results  Component Value Date   CREATININE 1.06 (H) 10/01/2021   BUN 14 10/01/2021   NA 140 10/01/2021   K 3.9 10/01/2021   CL 103 10/01/2021   CO2 24 10/01/2021   Lab Results  Component Value Date   ALT 24 10/01/2021   AST 19 10/01/2021   ALKPHOS  100 10/01/2021   BILITOT 0.6 10/01/2021   Lab Results  Component Value Date   HGBA1C 5.2 10/01/2021   HGBA1C 5.5 04/15/2021   HGBA1C 5.5 11/13/2020   HGBA1C 6.0 (H) 06/24/2020   HGBA1C 5.9 (H) 01/08/2020   Lab Results  Component Value Date   INSULIN 6.1 10/01/2021   INSULIN 11.2 04/03/2019   Lab Results  Component Value Date    TSH 0.575 04/03/2019   Lab Results  Component Value Date   CHOL 161 04/15/2021   HDL 55 04/15/2021   LDLCALC 91 04/15/2021   TRIG 80 04/15/2021   Lab Results  Component Value Date   VD25OH 49.1 10/01/2021   VD25OH 48.4 11/13/2020   VD25OH 44.4 06/24/2020   Lab Results  Component Value Date   WBC 6.5 04/15/2021   HGB 13.0 04/15/2021   HCT 39 04/15/2021   MCV 86 06/24/2020   PLT 279 04/15/2021   Lab Results  Component Value Date   IRON 162 04/15/2021   TIBC 252 06/24/2020   FERRITIN 64 06/24/2020    Attestation Statements:   Reviewed by clinician on day of visit: allergies, medications, problem list, medical history, surgical history, family history, social history, and previous encounter notes.  I, Davy Pique, RMA, am acting as Location manager for Mina Marble, NP.  I have reviewed the above documentation for accuracy and completeness, and I agree with the above. -  ***

## 2021-11-20 ENCOUNTER — Ambulatory Visit: Payer: BC Managed Care – PPO | Admitting: Adult Health

## 2021-11-20 ENCOUNTER — Other Ambulatory Visit (INDEPENDENT_AMBULATORY_CARE_PROVIDER_SITE_OTHER): Payer: Self-pay | Admitting: Adult Health

## 2021-11-20 DIAGNOSIS — E559 Vitamin D deficiency, unspecified: Secondary | ICD-10-CM

## 2021-11-26 ENCOUNTER — Ambulatory Visit (INDEPENDENT_AMBULATORY_CARE_PROVIDER_SITE_OTHER): Payer: BC Managed Care – PPO | Admitting: Adult Health

## 2021-11-26 ENCOUNTER — Encounter (INDEPENDENT_AMBULATORY_CARE_PROVIDER_SITE_OTHER): Payer: Self-pay | Admitting: Adult Health

## 2021-11-26 VITALS — BP 132/79 | HR 65 | Temp 98.1°F | Ht 62.0 in | Wt 186.0 lb

## 2021-11-26 DIAGNOSIS — E559 Vitamin D deficiency, unspecified: Secondary | ICD-10-CM

## 2021-11-26 DIAGNOSIS — Z170 Estrogen receptor positive status [ER+]: Secondary | ICD-10-CM

## 2021-11-26 DIAGNOSIS — C50412 Malignant neoplasm of upper-outer quadrant of left female breast: Secondary | ICD-10-CM

## 2021-11-26 DIAGNOSIS — Z6834 Body mass index (BMI) 34.0-34.9, adult: Secondary | ICD-10-CM

## 2021-11-26 DIAGNOSIS — E1169 Type 2 diabetes mellitus with other specified complication: Secondary | ICD-10-CM | POA: Diagnosis not present

## 2021-11-26 DIAGNOSIS — Z7985 Long-term (current) use of injectable non-insulin antidiabetic drugs: Secondary | ICD-10-CM

## 2021-11-26 DIAGNOSIS — E669 Obesity, unspecified: Secondary | ICD-10-CM

## 2021-11-26 MED ORDER — VITAMIN D (ERGOCALCIFEROL) 1.25 MG (50000 UNIT) PO CAPS: 50000.0000 [IU] | ORAL_CAPSULE | ORAL | 0 refills | Status: DC

## 2021-11-26 MED ORDER — BLOOD GLUCOSE MONITOR KIT: PACK | 0 refills | Status: DC

## 2021-12-02 ENCOUNTER — Telehealth: Payer: Self-pay | Admitting: *Deleted

## 2021-12-02 NOTE — Progress Notes (Unsigned)
Chief Complaint:   OBESITY Katrina Beard is here to discuss her progress with her obesity treatment plan along with follow-up of her obesity related diagnoses. Katrina Beard is on following a lower carbohydrate, vegetable and lean protein rich diet plan and states she is following her eating plan approximately 60% of the time. Katrina Beard states she is not exercising.  Today's visit was #: 25 Starting weight: 231 lbs Starting date: 04/03/2019 Today's weight: 186 lbs Today's date: 11/27/2019 Total lbs lost to date: 45 lbs Total lbs lost since last in-office visit: 3 lbs  Interim History: On 11/12/2021,  Katrina Beard had breast port placed.  She last used Narcotic on 11/18/2021.  11/27/2021, follow up with surgeon.   Subjective:   1. Type 2 diabetes mellitus with other specified complication, without long-term current use of insulin ([place]) 10/01/2021, A1c 5.2.  Insulin level 6.1.  only antidiabetic therapy Wegovy 2.4 once weekly injection.   2. Malignant neoplasm of upper-outer quadrant of left breast in female, estrogen receptor positive (HCC) ***  3. Vitamin D deficiency 10/01/2021, Vitamin D level 49.1.   Assessment/Plan:   1. Type 2 diabetes mellitus with other specified complication, without long-term current use of insulin (HCC) Glucometer ordered - blood glucose meter kit and supplies KIT; Dispense based on patient and insurance preference. Use up to four times daily as directed.  Dispense: 1 each; Refill: 0  2. Malignant neoplasm of upper-outer quadrant of left breast in female, estrogen receptor positive ([place]) Follow up with surgeon.   3. Vitamin D deficiency Refill - Vitamin D, Ergocalciferol, (DRISDOL) 1.25 MG (50000 UNIT) CAPS capsule; Take 1 capsule (50,000 Units total) by mouth every 7 (seven) days.  Dispense: 4 capsule; Refill: 0  4. Obesity with current BMI of 34.2 Katrina Beard is currently in the action stage of change. As such, her goal is to continue with weight loss efforts. She has agreed  to practicing portion control and making smarter food choices, such as increasing vegetables and decreasing simple carbohydrates.   Exercise goals: No exercise has been prescribed at this time. Post surgical recovery.   Behavioral modification strategies: increasing lean protein intake, decreasing simple carbohydrates, meal planning and cooking strategies, keeping healthy foods in the home, and planning for success.  Katrina Beard has agreed to follow-up with our clinic in 4 weeks. She was informed of the importance of frequent follow-up visits to maximize her success with intensive lifestyle modifications for her multiple health conditions.   Objective:   Blood pressure 132/79, pulse 65, temperature 98.1 F (36.7 C), height 5' 2"  (1.575 m), weight 186 lb (84.4 kg), SpO2 100 %. Body mass index is 34.02 kg/m.  General: Cooperative, alert, well developed, in no acute distress. HEENT: Conjunctivae and lids unremarkable. Cardiovascular: Regular rhythm.  Lungs: Normal work of breathing. Neurologic: No focal deficits.   Lab Results  Component Value Date   CREATININE 1.06 (H) 10/01/2021   BUN 14 10/01/2021   NA 140 10/01/2021   K 3.9 10/01/2021   CL 103 10/01/2021   CO2 24 10/01/2021   Lab Results  Component Value Date   ALT 24 10/01/2021   AST 19 10/01/2021   ALKPHOS 100 10/01/2021   BILITOT 0.6 10/01/2021   Lab Results  Component Value Date   HGBA1C 5.2 10/01/2021   HGBA1C 5.5 04/15/2021   HGBA1C 5.5 11/13/2020   HGBA1C 6.0 (H) 06/24/2020   HGBA1C 5.9 (H) 01/08/2020   Lab Results  Component Value Date   INSULIN 6.1 10/01/2021   INSULIN  11.2 04/03/2019   Lab Results  Component Value Date   TSH 0.575 04/03/2019   Lab Results  Component Value Date   CHOL 161 04/15/2021   HDL 55 04/15/2021   LDLCALC 91 04/15/2021   TRIG 80 04/15/2021   Lab Results  Component Value Date   VD25OH 49.1 10/01/2021   VD25OH 48.4 11/13/2020   VD25OH 44.4 06/24/2020   Lab Results   Component Value Date   WBC 6.5 04/15/2021   HGB 13.0 04/15/2021   HCT 39 04/15/2021   MCV 86 06/24/2020   PLT 279 04/15/2021   Lab Results  Component Value Date   IRON 162 04/15/2021   TIBC 252 06/24/2020   FERRITIN 64 06/24/2020   Attestation Statements:   Reviewed by clinician on day of visit: allergies, medications, problem list, medical history, surgical history, family history, social history, and previous encounter notes.  I, Davy Pique, RMA, am acting as Location manager for Mina Marble, NP.  I have reviewed the above documentation for accuracy and completeness, and I agree with the above. -  ***

## 2021-12-02 NOTE — Telephone Encounter (Signed)
Late entry for 11/26/2021.  [place] form completed by other forms nurse 11/18/2021.  Not located in office with her investigating to return by deadline of tenth of each month.  Signed by provider 11/20/2021.  Received 11/25/2021 in completed form folder.  Returned by this nurse Attn Joaquin Music on 11/26/2021 via fax [phone]) and e-mail [email or web address].  Mailed original to [name] address on file.  [address] [place] [address] [id number].  No further actions performed or required.  Copy to bin designated for scanning to EMR completes process.

## 2021-12-15 ENCOUNTER — Other Ambulatory Visit (INDEPENDENT_AMBULATORY_CARE_PROVIDER_SITE_OTHER): Payer: Self-pay | Admitting: Adult Health

## 2021-12-15 DIAGNOSIS — E559 Vitamin D deficiency, unspecified: Secondary | ICD-10-CM

## 2021-12-22 ENCOUNTER — Telehealth: Payer: Self-pay

## 2021-12-22 NOTE — Telephone Encounter (Signed)
Patient notified of completion of Disability Form [place] 703. Fax transmission confirmation received. Copy of form mailed to Patient as requested. No other needs or concerns voiced at this time.

## 2021-12-28 ENCOUNTER — Encounter (INDEPENDENT_AMBULATORY_CARE_PROVIDER_SITE_OTHER): Payer: Self-pay | Admitting: Adult Health

## 2021-12-28 ENCOUNTER — Ambulatory Visit (INDEPENDENT_AMBULATORY_CARE_PROVIDER_SITE_OTHER): Payer: BC Managed Care – PPO | Admitting: Adult Health

## 2021-12-28 DIAGNOSIS — I152 Hypertension secondary to endocrine disorders: Secondary | ICD-10-CM

## 2021-12-28 DIAGNOSIS — E669 Obesity, unspecified: Secondary | ICD-10-CM

## 2021-12-28 DIAGNOSIS — Z6833 Body mass index (BMI) 33.0-33.9, adult: Secondary | ICD-10-CM

## 2021-12-28 DIAGNOSIS — E1169 Type 2 diabetes mellitus with other specified complication: Secondary | ICD-10-CM

## 2021-12-28 DIAGNOSIS — Z7985 Long-term (current) use of injectable non-insulin antidiabetic drugs: Secondary | ICD-10-CM

## 2021-12-28 DIAGNOSIS — E1159 Type 2 diabetes mellitus with other circulatory complications: Secondary | ICD-10-CM | POA: Diagnosis not present

## 2021-12-28 DIAGNOSIS — E559 Vitamin D deficiency, unspecified: Secondary | ICD-10-CM | POA: Diagnosis not present

## 2021-12-28 MED ORDER — AMLODIPINE BESYLATE 10 MG PO TABS: 10.0000 mg | ORAL_TABLET | Freq: Every day | ORAL | 0 refills | Status: DC

## 2021-12-28 MED ORDER — VITAMIN D (ERGOCALCIFEROL) 1.25 MG (50000 UNIT) PO CAPS: 50000.0000 [IU] | ORAL_CAPSULE | ORAL | 0 refills | Status: DC

## 2021-12-28 MED ORDER — WEGOVY 2.4 MG/0.75ML ~~LOC~~ SOAJ: 2.4000 mg | SUBCUTANEOUS | 0 refills | Status: DC

## 2021-12-29 ENCOUNTER — Encounter (INDEPENDENT_AMBULATORY_CARE_PROVIDER_SITE_OTHER): Payer: Self-pay

## 2022-01-03 ENCOUNTER — Other Ambulatory Visit: Payer: Self-pay | Admitting: Hematology and Oncology

## 2022-01-04 NOTE — Progress Notes (Signed)
Chief Complaint:   OBESITY Katrina Beard is here to discuss her progress with her obesity treatment plan along with follow-up of her obesity related diagnoses. Katrina Beard is on practicing portion control and making smarter food choices, such as increasing vegetables and decreasing simple carbohydrates and states she is following her eating plan approximately 75% of the time. Katrina Beard states she is not exercising.   Today's visit was #: 16 Starting weight: 231 lbs Starting date: 04/03/2019 Today's weight: 183 lbs Today's date: 12/28/2021 Total lbs lost to date: 48 lbs Total lbs lost since last in-office visit: 3 lbs  Interim History:  Katrina Beard reports frequently consuming baked chicken, air fried pork, scrambled eggs, greens (mixed, collards, cabbages).     Breast Cancer Hx:  05/25/21- OV Notes with General Surgeon/[facility]: Have an area of calcifications left breast upper outer quadrant measuring over 12 cm. 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 The other areas showed a CSL with some atypia. Katrina Beard had breast cancer in her 34s she states. No other families were noted to have breast cancer. This is her first breast biopsy. Patient denies any history of breast pain, nipple discharge or change to either breast. She had a previous lumpectomy for benign disease in the left side in the past.  06/11/21-LEFT BREAST LUMPECTOMY WITH RADIOACTIVE SEED LOCALIZATION X2  06/18/21-OV Notes with General Surgeon/[facility]: Katrina Beard is a 61 y.o. female who underwent left breast lumpectomy for DCIS on 06/11/2021 by Dr. Brantley Stage. She is presenting today with concerns of hearing fluid in her breast. She states the area is sore but denies any major pain and has not taking any pain medication. Denies redness or fever. Denies drainage.  5-08/2021-  Adjuvant radiation and antiestrogen therapy  08/06/21- Tamooxifen started, Bupropion  stopped.  09/08/21-OV Notes with General Surgeon/[facility]: Katrina Beard is a 61 y.o. female who underwent left breast lumpectomy for DCIS on 06/11/2021 by Dr. Brantley Stage. She was last seen in April 2023 prior to starting radiation. She called the office today complaining of swelling to the left breast with firmness above the incision. She states the swelling has been present for several weeks but has gotten worse recently. She completed radiation on 08/14/2021. She states last week she noticed swelling to her left breast and was not sure if it was normal. She denies any major pain. She also states she has had some changes to her nipple after radiation. Denies fever. She is taking tamoxifen.  10/09/21-OV Notes with General Surgeon/[facility]: Katrina Beard is a 61 y.o. 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 This was aspirated but unfortunate is quite large and tense. The fluid was very thick. She is about the same. No fever, chills, drainage or redness noted. This is quite large and chronic. Fluid is high viscosity. At this point time recommend open drainage of a chronic left breast seroma. Risks and benefits of surgery as well as repeat aspiration discussed as well. Likelihood of success is very very low with repeat aspiration therefore recommend open drainage with placement of drain in the OR. Risk of bleeding, infection, recurrence, nonresolution of symptoms and/or seroma, poor wound healing, cosmetic deformity cardiovascular risk DVT, PE and rarely death has potential risk of any intervention. She agrees to proceed.  11/27/21-OV Notes with General Surgeon/[facility] [place] [facility]: Katrina Beard is a 61 y.o. female who is seen  today for postop check 2 weeks after placement of drain for chronic left breast seroma. She is draining about 25 cc/day of serous fluid. She has no complaints and the pain is  much better.  12/04/21 -OV Notes with General Surgeon/[facility]: Katrina Beard is a 61 y.o. female who is seen today for postop visit after drainage of chronic left breast seroma 3 weeks ago. Her drain output is less than 25 cc a day. It is turbid and there is an odor at the drain site.  12/21/21--OV Notes with General Surgeon/[facility]: Katrina Beard is a 61 y.o. female who is seen today for wound recheck after drainage of chronic left breast seroma status post lumpectomy followed radiation therapy last year. She was seen by her PA and apparently had some drainage and this was opened up. It is now packed. There is still drainage but the smell is improved. There is no erythema and there is no pain. She change the packing once a day. Packing removed. Much less drainage and no foul smell today. The incision opening measures about a centimeter. I repacked today with iodoform packing. Return next week and continue to change packing once a day. No signs of infection and no erythema. No follow-ups on file.  Subjective:   1. Hypertension associated with type 2 diabetes mellitus ([place]) Blood pressure and heart rate were excellent at office visit.  She is currently on Amlodipine '10mg'$  QD and Valsartan '320mg'$  QD. She denies chest pain with exertion.  2. Vitamin D deficiency 07/20/82023, Vitamin D level 49.1- below goal of 50-70. She is on weekly oral Ergocalciferol.  3. Type 2 diabetes mellitus with other specified complication, without long-term current use of insulin (HCC) Lab Results  Component Value Date   HGBA1C 5.2 10/01/2021   HGBA1C 5.5 04/15/2021   HGBA1C 5.5 11/13/2020    She is currently on Wegovy 2.4 mg once weekly. She denies mass in neck, dysphagia, dyspepsia, persistent hoarseness, abdominal pain, or N/V/Constipation.  Assessment/Plan:   1. Hypertension associated with type 2 diabetes mellitus (HCC) refill- amLODipine (NORVASC) 10 MG tablet; Take 1 tablet  (10 mg total) by mouth daily.  Dispense: 30 tablet; Refill: 0  2. Vitamin D deficiency Refill - Vitamin D, Ergocalciferol, (DRISDOL) 1.25 MG (50000 UNIT) CAPS capsule; Take 1 capsule (50,000 Units total) by mouth every 7 (seven) days.  Dispense: 4 capsule; Refill: 0  3. Type 2 diabetes mellitus with other specified complication, without long-term current use of insulin (HCC) Refill - Semaglutide-Weight Management (WEGOVY) 2.4 MG/0.75ML SOAJ; Inject 2.4 mg into the skin once a week.  Dispense: 3 mL; Refill: 0  4. Obesity with current BMI of 33.5 Check fasting labs at next office visit.   Refill - Semaglutide-Weight Management (WEGOVY) 2.4 MG/0.75ML SOAJ; Inject 2.4 mg into the skin once a week.  Dispense: 3 mL; Refill: 0  Katrina Beard is currently in the action stage of change. As such, her goal is to continue with weight loss efforts. She has agreed to practicing portion control and making smarter food choices, such as increasing vegetables and decreasing simple carbohydrates.   Exercise goals:  increasing daily walking.   Behavioral modification strategies: increasing lean protein intake, decreasing simple carbohydrates, meal planning and cooking strategies, keeping healthy foods in the home, and planning for success.  Katrina Beard has agreed to follow-up with our clinic in 4 weeks. She was informed of the importance of frequent follow-up visits to maximize her success with intensive lifestyle modifications for her multiple  health conditions.   Objective:   Blood pressure 128/82, pulse 68, temperature 97.9 F (36.6 C), height '5\' 2"'$  (1.575 m), weight 183 lb (83 kg), SpO2 100 %. Body mass index is 33.47 kg/m.  General: Cooperative, alert, well developed, in no acute distress. HEENT: Conjunctivae and lids unremarkable. Cardiovascular: Regular rhythm.  Lungs: Normal work of breathing. Neurologic: No focal deficits.   Lab Results  Component Value Date   CREATININE 1.06 (H) 10/01/2021   BUN 14  10/01/2021   NA 140 10/01/2021   K 3.9 10/01/2021   CL 103 10/01/2021   CO2 24 10/01/2021   Lab Results  Component Value Date   ALT 24 10/01/2021   AST 19 10/01/2021   ALKPHOS 100 10/01/2021   BILITOT 0.6 10/01/2021   Lab Results  Component Value Date   HGBA1C 5.2 10/01/2021   HGBA1C 5.5 04/15/2021   HGBA1C 5.5 11/13/2020   HGBA1C 6.0 (H) 06/24/2020   HGBA1C 5.9 (H) 01/08/2020   Lab Results  Component Value Date   INSULIN 6.1 10/01/2021   INSULIN 11.2 04/03/2019   Lab Results  Component Value Date   TSH 0.575 04/03/2019   Lab Results  Component Value Date   CHOL 161 04/15/2021   HDL 55 04/15/2021   LDLCALC 91 04/15/2021   TRIG 80 04/15/2021   Lab Results  Component Value Date   VD25OH 49.1 10/01/2021   VD25OH 48.4 11/13/2020   VD25OH 44.4 06/24/2020   Lab Results  Component Value Date   WBC 6.5 04/15/2021   HGB 13.0 04/15/2021   HCT 39 04/15/2021   MCV 86 06/24/2020   PLT 279 04/15/2021   Lab Results  Component Value Date   IRON 162 04/15/2021   TIBC 252 06/24/2020   FERRITIN 64 06/24/2020    Attestation Statements:   Reviewed by clinician on day of visit: allergies, medications, problem list, medical history, surgical history, family history, social history, and previous encounter notes.  I, Davy Pique, RMA, am acting as Location manager for Mina Marble, NP.  I have reviewed the above documentation for accuracy and completeness, and I agree with the above. -  Misao Fackrell d. Samentha Perham, NP-C

## 2022-01-19 ENCOUNTER — Telehealth: Payer: Self-pay | Admitting: *Deleted

## 2022-01-19 NOTE — Telephone Encounter (Signed)
Today received paperwork signed by provider.  Successfully returned to [place]. (Fax: [phone]).  Original mailed to [name],  [address] [phone] Copy to [employer].I[employer] bin for items to be scanned completes process.

## 2022-01-19 NOTE — Telephone Encounter (Signed)
01/18/2022 Late entry. [employer] form 703 completed today by this nurse.  To collaborative bin for pick up for provider review, signature and return.

## 2022-01-21 ENCOUNTER — Other Ambulatory Visit (INDEPENDENT_AMBULATORY_CARE_PROVIDER_SITE_OTHER): Payer: Self-pay | Admitting: Adult Health

## 2022-01-21 DIAGNOSIS — E1159 Type 2 diabetes mellitus with other circulatory complications: Secondary | ICD-10-CM

## 2022-01-27 ENCOUNTER — Encounter (INDEPENDENT_AMBULATORY_CARE_PROVIDER_SITE_OTHER): Payer: Self-pay | Admitting: Family Medicine

## 2022-01-27 ENCOUNTER — Ambulatory Visit (INDEPENDENT_AMBULATORY_CARE_PROVIDER_SITE_OTHER): Payer: BC Managed Care – PPO | Admitting: Family Medicine

## 2022-01-27 VITALS — BP 137/81 | HR 66 | Temp 98.1°F | Ht 62.0 in | Wt 185.0 lb

## 2022-01-27 DIAGNOSIS — I152 Hypertension secondary to endocrine disorders: Secondary | ICD-10-CM

## 2022-01-27 DIAGNOSIS — E1159 Type 2 diabetes mellitus with other circulatory complications: Secondary | ICD-10-CM

## 2022-01-27 DIAGNOSIS — E559 Vitamin D deficiency, unspecified: Secondary | ICD-10-CM | POA: Diagnosis not present

## 2022-01-27 DIAGNOSIS — Z7985 Long-term (current) use of injectable non-insulin antidiabetic drugs: Secondary | ICD-10-CM

## 2022-01-27 DIAGNOSIS — Z6841 Body Mass Index (BMI) 40.0 and over, adult: Secondary | ICD-10-CM

## 2022-01-27 DIAGNOSIS — E119 Type 2 diabetes mellitus without complications: Secondary | ICD-10-CM

## 2022-01-27 DIAGNOSIS — E1169 Type 2 diabetes mellitus with other specified complication: Secondary | ICD-10-CM

## 2022-01-27 DIAGNOSIS — Z6833 Body mass index (BMI) 33.0-33.9, adult: Secondary | ICD-10-CM

## 2022-01-27 DIAGNOSIS — E669 Obesity, unspecified: Secondary | ICD-10-CM

## 2022-01-27 MED ORDER — AMLODIPINE BESYLATE 10 MG PO TABS: 10.0000 mg | ORAL_TABLET | Freq: Every day | ORAL | 0 refills | Status: DC

## 2022-01-27 MED ORDER — VITAMIN D (ERGOCALCIFEROL) 1.25 MG (50000 UNIT) PO CAPS: 50000.0000 [IU] | ORAL_CAPSULE | ORAL | 0 refills | Status: DC

## 2022-01-27 MED ORDER — WEGOVY 2.4 MG/0.75ML ~~LOC~~ SOAJ: 2.4000 mg | SUBCUTANEOUS | 0 refills | Status: DC

## 2022-02-08 NOTE — Progress Notes (Signed)
Chief Complaint:   OBESITY Katrina Beard is here to discuss her progress with her obesity treatment plan along with follow-up of her obesity related diagnoses. Katrina Beard is on practicing portion control and making smarter food choices, such as increasing vegetables and decreasing simple carbohydrates and states she is following her eating plan approximately 50% of the time. Katrina Beard states she is not exercising.   Today's visit was #: 66 Starting weight: 231 lbs Starting date: 04/03/2019 Today's weight: 185 lbs Today's date: 01/27/2022 Total lbs lost to date: 46 lbs Total lbs lost since last in-office visit: +2 lbs  Interim History: 1st office visit with me.  Patient is on PC/[place].  She is having more cravings after dinner.  Patient often skips meals and skips portions.    Subjective:   1. Type 2 diabetes mellitus with obesity ([place]) Katrina Beard is tolerating medication(s) well without side effects.  Medication compliance is good as patient endorses taking it as prescribed.  The patient denies additional concerns regarding this condition.    She is not checking blood sugars.    2. Hypertension associated with type 2 diabetes mellitus ([place]) Review: taking medications as instructed, no medication side effects noted, no chest pain on exertion, no dyspnea on exertion, no swelling of ankles.   3. Vitamin D deficiency She is currently taking prescription vitamin D 50,000 IU each week. She denies nausea, vomiting or muscle weakness.  Assessment/Plan:  No orders of the defined types were placed in this encounter.   Medications Discontinued During This Encounter  Medication Reason   amLODipine (NORVASC) 10 MG tablet Reorder   Vitamin D, Ergocalciferol, (DRISDOL) 1.25 MG (50000 UNIT) CAPS capsule Reorder   Semaglutide-Weight Management (WEGOVY) 2.4 MG/0.75ML SOAJ Reorder     Meds ordered this encounter  Medications   amLODipine (NORVASC) 10 MG tablet    Sig: Take 1 tablet (10 mg total) by mouth  daily.    Dispense:  30 tablet    Refill:  0   Vitamin D, Ergocalciferol, (DRISDOL) 1.25 MG (50000 UNIT) CAPS capsule    Sig: Take 1 capsule (50,000 Units total) by mouth every 7 (seven) days.    Dispense:  4 capsule    Refill:  0   Semaglutide-Weight Management (WEGOVY) 2.4 MG/0.75ML SOAJ    Sig: Inject 2.4 mg into the skin once a week.    Dispense:  3 mL    Refill:  0     1. Type 2 diabetes mellitus with obesity (HCC) Good blood sugar control is important to decrease the likelihood of diabetic complications such as nephropathy, neuropathy, limb loss, blindness, coronary artery disease, and death. Intensive lifestyle modification including diet, exercise and weight loss are the first line of treatment for diabetes.   Refill - Semaglutide-Weight Management (WEGOVY) 2.4 MG/0.75ML SOAJ; Inject 2.4 mg into the skin once a week.  Dispense: 3 mL; Refill: 0  2. Hypertension associated with type 2 diabetes mellitus ([place]) Blood pressure is at goal.   Refill - amLODipine (NORVASC) 10 MG tablet; Take 1 tablet (10 mg total) by mouth daily.  Dispense: 30 tablet; Refill: 0  3. Vitamin D deficiency Low Vitamin D level contributes to fatigue and are associated with obesity, breast, and colon cancer. She agrees to continue to take prescription Vitamin D '@50'$ ,000 IU every week and will follow-up for routine testing of Vitamin D, at least 2-3 times per year to avoid over-replacement.  Refill - Vitamin D, Ergocalciferol, (DRISDOL) 1.25 MG (50000 UNIT) CAPS  capsule; Take 1 capsule (50,000 Units total) by mouth every 7 (seven) days.  Dispense: 4 capsule; Refill: 0  4. Obesity with current BMI of 33.9 Patient will write down portions of food and veggies and proteins.   Refill- Semaglutide-Weight Management (WEGOVY) 2.4 MG/0.75ML SOAJ; Inject 2.4 mg into the skin once a week.  Dispense: 3 mL; Refill: 0  Katrina Beard is currently in the action stage of change. As such, her goal is to continue with weight loss  efforts. She has agreed to practicing portion control and making smarter food choices, such as increasing vegetables and decreasing simple carbohydrates.   Exercise goals:  Start walking as tolerated.   Behavioral modification strategies: increasing lean protein intake, decreasing simple carbohydrates, and no skipping meals.  Katrina Beard has agreed to follow-up with our clinic in 3-4 weeks. She was informed of the importance of frequent follow-up visits to maximize her success with intensive lifestyle modifications for her multiple health conditions.   Objective:   Blood pressure 137/81, pulse 66, temperature 98.1 F (36.7 C), height '5\' 2"'$  (1.575 m), weight 185 lb (83.9 kg), SpO2 97 %. Body mass index is 33.84 kg/m.  General: Cooperative, alert, well developed, in no acute distress. HEENT: Conjunctivae and lids unremarkable. Cardiovascular: Regular rhythm.  Lungs: Normal work of breathing. Neurologic: No focal deficits.   Lab Results  Component Value Date   CREATININE 1.06 (H) 10/01/2021   BUN 14 10/01/2021   NA 140 10/01/2021   K 3.9 10/01/2021   CL 103 10/01/2021   CO2 24 10/01/2021   Lab Results  Component Value Date   ALT 24 10/01/2021   AST 19 10/01/2021   ALKPHOS 100 10/01/2021   BILITOT 0.6 10/01/2021   Lab Results  Component Value Date   HGBA1C 5.2 10/01/2021   HGBA1C 5.5 04/15/2021   HGBA1C 5.5 11/13/2020   HGBA1C 6.0 (H) 06/24/2020   HGBA1C 5.9 (H) 01/08/2020   Lab Results  Component Value Date   INSULIN 6.1 10/01/2021   INSULIN 11.2 04/03/2019   Lab Results  Component Value Date   TSH 0.575 04/03/2019   Lab Results  Component Value Date   CHOL 161 04/15/2021   HDL 55 04/15/2021   LDLCALC 91 04/15/2021   TRIG 80 04/15/2021   Lab Results  Component Value Date   VD25OH 49.1 10/01/2021   VD25OH 48.4 11/13/2020   VD25OH 44.4 06/24/2020   Lab Results  Component Value Date   WBC 6.5 04/15/2021   HGB 13.0 04/15/2021   HCT 39 04/15/2021   MCV 86  06/24/2020   PLT 279 04/15/2021   Lab Results  Component Value Date   IRON 162 04/15/2021   TIBC 252 06/24/2020   FERRITIN 64 06/24/2020   Attestation Statements:   Reviewed by clinician on day of visit: allergies, medications, problem list, medical history, surgical history, family history, social history, and previous encounter notes.  I, Davy Pique, RMA, am acting as Location manager for [employer], DO.  I have reviewed the above documentation for accuracy and completeness, and I agree with the above. Marjory Sneddon, D.O.  The [place] was signed into law in 2016 which includes the topic of electronic health records.  This provides immediate access to information in MyChart.  This includes consultation notes, operative notes, office notes, lab results and pathology reports.  If you have any questions about what you read please let us know at your next visit so we can discuss your concerns and take corrective  action if need be.  We are right here with you.

## 2022-02-25 ENCOUNTER — Ambulatory Visit (INDEPENDENT_AMBULATORY_CARE_PROVIDER_SITE_OTHER): Payer: BC Managed Care – PPO | Admitting: Family Medicine

## 2022-02-25 ENCOUNTER — Encounter (INDEPENDENT_AMBULATORY_CARE_PROVIDER_SITE_OTHER): Payer: Self-pay | Admitting: Family Medicine

## 2022-02-25 VITALS — BP 141/84 | HR 67 | Temp 98.0°F | Ht 62.0 in | Wt 188.2 lb

## 2022-02-25 DIAGNOSIS — Z7985 Long-term (current) use of injectable non-insulin antidiabetic drugs: Secondary | ICD-10-CM

## 2022-02-25 DIAGNOSIS — Z6841 Body Mass Index (BMI) 40.0 and over, adult: Secondary | ICD-10-CM

## 2022-02-25 DIAGNOSIS — E1159 Type 2 diabetes mellitus with other circulatory complications: Secondary | ICD-10-CM | POA: Diagnosis not present

## 2022-02-25 DIAGNOSIS — E1169 Type 2 diabetes mellitus with other specified complication: Secondary | ICD-10-CM | POA: Diagnosis not present

## 2022-02-25 DIAGNOSIS — E559 Vitamin D deficiency, unspecified: Secondary | ICD-10-CM

## 2022-02-25 DIAGNOSIS — Z6834 Body mass index (BMI) 34.0-34.9, adult: Secondary | ICD-10-CM

## 2022-02-25 DIAGNOSIS — E669 Obesity, unspecified: Secondary | ICD-10-CM

## 2022-02-25 DIAGNOSIS — Z9189 Other specified personal risk factors, not elsewhere classified: Secondary | ICD-10-CM | POA: Diagnosis not present

## 2022-02-25 DIAGNOSIS — I152 Hypertension secondary to endocrine disorders: Secondary | ICD-10-CM | POA: Diagnosis not present

## 2022-02-25 DIAGNOSIS — E119 Type 2 diabetes mellitus without complications: Secondary | ICD-10-CM

## 2022-02-25 MED ORDER — WEGOVY 2.4 MG/0.75ML ~~LOC~~ SOAJ: 2.4000 mg | SUBCUTANEOUS | 0 refills | Status: DC

## 2022-02-25 MED ORDER — VITAMIN D (ERGOCALCIFEROL) 1.25 MG (50000 UNIT) PO CAPS: 50000.0000 [IU] | ORAL_CAPSULE | ORAL | 0 refills | Status: DC

## 2022-02-25 MED ORDER — AMLODIPINE BESYLATE 10 MG PO TABS: 10.0000 mg | ORAL_TABLET | Freq: Every day | ORAL | 0 refills | Status: DC

## 2022-03-10 NOTE — Progress Notes (Signed)
Chief Complaint:   OBESITY Katrina Beard is here to discuss her progress with her obesity treatment plan along with follow-up of her obesity related diagnoses. Katrina Beard is on practicing portion control and making smarter food choices, such as increasing vegetables and decreasing simple carbohydrates and states she is following her eating plan approximately 75% of the time. Katrina Beard states she is walking 4 times per week.  Today's visit was #: 69 Starting weight: 231 LBS Starting date: 04/03/2019 Today's weight: 188 LBS Today's date: 02/25/2022 Total lbs lost to date: 98 LBS Total lbs lost since last in-office visit: +3 LBS  Interim History: Second office visit with me.  Breakfast 2 boiled eggs, yogurt with coffee.  Lunch salad from [employer], cough or [employer] [place] salad, or burger and fries.  Dinner ham sandwich or skipping.  Patient is doing less snacking and trying to eat more protein.  Her snacks include Fritos, peanut, mixed nuts with crackers.  Came back from [place] vacation about 1 week ago.  Subjective:   1. Type 2 diabetes mellitus with obesity (HCC) Not checking blood sugars at all, need to get a new monitor.  Katrina Beard stands from monitor to pharmacy per patient it is never is there to pick up.  No symptoms.  Tolerating Wegovy well.  2. Vitamin D deficiency Vitamin D 49.1 about 3 to 4 months ago at goal.  Tolerating ergocalciferol well.  Compliance is good.  3. Hypertension associated with type 2 diabetes mellitus ([place]) Not checking blood pressure at home.  No symptoms or concerns.  Blood pressure up today, not taking all medications as written.  Skipped Norvasc because she was in [place] for one week.  She has been taking Diovan on most days.    4. At increased risk of exposure to COVID-19 virus Katrina Beard was given approximately 15 minutes of COVID prevention counseling today Counseling COVID-19 is a respiratory infection that is caused by a virus. It can cause serious infections, such as  pneumonia, acute respiratory distress syndrome, acute respiratory failure, or sepsis. You are more likely to develop a serious illness if you are 37 years of age or older, have a weak immune system, live in a nursing home, have chronic disease, or have obesity. Get vaccinated as soon as they are available to you.  For our most current information, please visit [email or web address]. Wash your hands often with soap and water for 20 seconds. If soap and water are not available, use alcohol-based hand sanitizer. Wear a face mask. Make sure your mask covers your nose and mouth. Maintain at least 6 feet distance from others when in public.  Get help right away if You have trouble breathing, chest pain, confusion, or other concerning symptoms.  Repetitive spaced learning was employed today to elicit superior memory formation and behavioral change.   Assessment/Plan:  No orders of the defined types were placed in this encounter.   Medications Discontinued During This Encounter  Medication Reason   amLODipine (NORVASC) 10 MG tablet Reorder   Vitamin D, Ergocalciferol, (DRISDOL) 1.25 MG (50000 UNIT) CAPS capsule Reorder   Semaglutide-Weight Management (WEGOVY) 2.4 MG/0.75ML SOAJ Reorder     Meds ordered this encounter  Medications   amLODipine (NORVASC) 10 MG tablet    Sig: Take 1 tablet (10 mg total) by mouth daily. RF per PCP-Wolters in future    Dispense:  30 tablet    Refill:  0   Semaglutide-Weight Management (WEGOVY) 2.4 MG/0.75ML SOAJ    Sig: Inject 2.4 mg into  the skin once a week.    Dispense:  3 mL    Refill:  0   Vitamin D, Ergocalciferol, (DRISDOL) 1.25 MG (50000 UNIT) CAPS capsule    Sig: Take 1 capsule (50,000 Units total) by mouth every 7 (seven) days.    Dispense:  4 capsule    Refill:  0     1. Type 2 diabetes mellitus with obesity (HCC) Refill - Semaglutide-Weight Management (WEGOVY) 2.4 MG/0.75ML SOAJ; Inject 2.4 mg into the skin once a week.  Dispense: 3 mL;  Refill: 0  2. Vitamin D deficiency Low Vitamin D level contributes to fatigue and are associated with obesity, breast, and colon cancer. She agrees to continue to take prescription Vitamin D '@50'$ ,000 IU every week and will follow-up for routine testing of Vitamin D, at least 2-3 times per year to avoid over-replacement.  Refill - Vitamin D, Ergocalciferol, (DRISDOL) 1.25 MG (50000 UNIT) CAPS capsule; Take 1 capsule (50,000 Units total) by mouth every 7 (seven) days.  Dispense: 4 capsule; Refill: 0  3. Hypertension associated with type 2 diabetes mellitus ([place]) Medication compliance discussed with patient.  Take as written.  Refill Norvasc, but I recommend to patient to get all blood pressure medications from PCP in future and not here at our clinic.  Continue Norvasc and valsartan blood pressure at goal 130/80 or less.  Refill - amLODipine (NORVASC) 10 MG tablet; Take 1 tablet (10 mg total) by mouth daily. RF per PCP-Wolters in future  Dispense: 30 tablet; Refill: 0  4. At increased risk of exposure to COVID-19 virus We will continue to monitor. Orders and follow up as documented in patient record.  Counseling COVID-19 is a respiratory infection that is caused by a virus. It can cause serious infections, such as pneumonia, acute respiratory distress syndrome, acute respiratory failure, or sepsis. You are more likely to develop a serious illness if you are 88 years of age or older, have a weak immune system, live in a nursing home, have chronic disease, or have obesity. Get vaccinated as soon as they are available to you.  For our most current information, please visit [email or web address]. Wash your hands often with soap and water for 20 seconds. If soap and water are not available, use alcohol-based hand sanitizer. Wear a face mask. Make sure your mask covers your nose and mouth. Maintain at least 6 feet distance from others when in public.  Get help right away if You have trouble  breathing, chest pain, confusion, or other concerning symptoms.  5. Obesity with current BMI of 34.4 Holiday eating guide given to patient.  Encouraged to increase protein intake, decrease carbs, fried foods and eating out.  Refill- Semaglutide-Weight Management (WEGOVY) 2.4 MG/0.75ML SOAJ; Inject 2.4 mg into the skin once a week.  Dispense: 3 mL; Refill: 0  Daliah is currently in the action stage of change. As such, her goal is to continue with weight loss efforts. She has agreed to the Category 2 Plan with lunch options or practicing portion control and making smarter food choices, such as increasing vegetables and decreasing simple carbohydrates.   Exercise goals:  As is.  Behavioral modification strategies: decreasing simple carbohydrates, decreasing eating out, holiday eating strategies , and planning for success.  Daesha has agreed to follow-up with our clinic in 4 weeks. She was informed of the importance of frequent follow-up visits to maximize her success with intensive lifestyle modifications for her multiple health conditions.   Objective:   Blood pressure (!) 141/84,  pulse 67, temperature 98 F (36.7 C), height '5\' 2"'$  (1.575 m), weight 188 lb 3.2 oz (85.4 kg), SpO2 100 %. Body mass index is 34.42 kg/m.  General: Cooperative, alert, well developed, in no acute distress. HEENT: Conjunctivae and lids unremarkable. Cardiovascular: Regular rhythm.  Lungs: Normal work of breathing. Neurologic: No focal deficits.   Lab Results  Component Value Date   CREATININE 1.06 (H) 10/01/2021   BUN 14 10/01/2021   NA 140 10/01/2021   K 3.9 10/01/2021   CL 103 10/01/2021   CO2 24 10/01/2021   Lab Results  Component Value Date   ALT 24 10/01/2021   AST 19 10/01/2021   ALKPHOS 100 10/01/2021   BILITOT 0.6 10/01/2021   Lab Results  Component Value Date   HGBA1C 5.2 10/01/2021   HGBA1C 5.5 04/15/2021   HGBA1C 5.5 11/13/2020   HGBA1C 6.0 (H) 06/24/2020   HGBA1C 5.9 (H) 01/08/2020    Lab Results  Component Value Date   INSULIN 6.1 10/01/2021   INSULIN 11.2 04/03/2019   Lab Results  Component Value Date   TSH 0.575 04/03/2019   Lab Results  Component Value Date   CHOL 161 04/15/2021   HDL 55 04/15/2021   LDLCALC 91 04/15/2021   TRIG 80 04/15/2021   Lab Results  Component Value Date   VD25OH 49.1 10/01/2021   VD25OH 48.4 11/13/2020   VD25OH 44.4 06/24/2020   Lab Results  Component Value Date   WBC 6.5 04/15/2021   HGB 13.0 04/15/2021   HCT 39 04/15/2021   MCV 86 06/24/2020   PLT 279 04/15/2021   Lab Results  Component Value Date   IRON 162 04/15/2021   TIBC 252 06/24/2020   FERRITIN 64 06/24/2020   Attestation Statements:   Reviewed by clinician on day of visit: allergies, medications, problem list, medical history, surgical history, family history, social history, and previous encounter notes.  I, Davy Pique, RMA, am acting as Location manager for [employer], DO.  I have reviewed the above documentation for accuracy and completeness, and I agree with the above. Marjory Sneddon, D.O.  The [place] was signed into law in 2016 which includes the topic of electronic health records.  This provides immediate access to information in MyChart.  This includes consultation notes, operative notes, office notes, lab results and pathology reports.  If you have any questions about what you read please let us know at your next visit so we can discuss your concerns and take corrective action if need be.  We are right here with you.

## 2022-03-25 ENCOUNTER — Ambulatory Visit (INDEPENDENT_AMBULATORY_CARE_PROVIDER_SITE_OTHER): Payer: BC Managed Care – PPO | Admitting: Adult Health

## 2022-03-25 ENCOUNTER — Encounter (INDEPENDENT_AMBULATORY_CARE_PROVIDER_SITE_OTHER): Payer: Self-pay | Admitting: Adult Health

## 2022-03-25 VITALS — BP 117/78 | HR 72 | Temp 98.1°F | Ht 62.0 in | Wt 190.0 lb

## 2022-03-25 DIAGNOSIS — I152 Hypertension secondary to endocrine disorders: Secondary | ICD-10-CM | POA: Diagnosis not present

## 2022-03-25 DIAGNOSIS — Z6834 Body mass index (BMI) 34.0-34.9, adult: Secondary | ICD-10-CM

## 2022-03-25 DIAGNOSIS — E559 Vitamin D deficiency, unspecified: Secondary | ICD-10-CM | POA: Diagnosis not present

## 2022-03-25 DIAGNOSIS — E1159 Type 2 diabetes mellitus with other circulatory complications: Secondary | ICD-10-CM

## 2022-03-25 DIAGNOSIS — E1169 Type 2 diabetes mellitus with other specified complication: Secondary | ICD-10-CM

## 2022-03-25 DIAGNOSIS — E669 Obesity, unspecified: Secondary | ICD-10-CM

## 2022-03-25 DIAGNOSIS — Z7985 Long-term (current) use of injectable non-insulin antidiabetic drugs: Secondary | ICD-10-CM

## 2022-03-25 MED ORDER — VITAMIN D (ERGOCALCIFEROL) 1.25 MG (50000 UNIT) PO CAPS: 50000.0000 [IU] | ORAL_CAPSULE | ORAL | 0 refills | Status: DC

## 2022-03-25 MED ORDER — WEGOVY 2.4 MG/0.75ML ~~LOC~~ SOAJ: 2.4000 mg | SUBCUTANEOUS | 0 refills | Status: DC

## 2022-03-26 LAB — VITAMIN D 25 HYDROXY (VIT D DEFICIENCY, FRACTURES): Vit D, 25-Hydroxy: 39.3 ng/mL (ref 30.0–100.0)

## 2022-03-26 LAB — COMPREHENSIVE METABOLIC PANEL
ALT: 35 IU/L — ABNORMAL HIGH (ref 0–32)
AST: 24 IU/L (ref 0–40)
Albumin/Globulin Ratio: 1.3 (ref 1.2–2.2)
Albumin: 3.8 g/dL (ref 3.8–4.9)
Alkaline Phosphatase: 88 IU/L (ref 44–121)
BUN/Creatinine Ratio: 16 (ref 9–23)
BUN: 14 mg/dL (ref 6–24)
Bilirubin Total: 0.5 mg/dL (ref 0.0–1.2)
CO2: 22 mmol/L (ref 20–29)
Calcium: 8.8 mg/dL (ref 8.7–10.2)
Chloride: 104 mmol/L (ref 96–106)
Creatinine, Ser: 0.87 mg/dL (ref 0.57–1.00)
Globulin, Total: 2.9 g/dL (ref 1.5–4.5)
Glucose: 78 mg/dL (ref 70–99)
Potassium: 3.8 mmol/L (ref 3.5–5.2)
Sodium: 141 mmol/L (ref 134–144)
Total Protein: 6.7 g/dL (ref 6.0–8.5)
eGFR: 77 mL/min/{1.73_m2} (ref >59)

## 2022-03-26 LAB — HEMOGLOBIN A1C
Est. average glucose Bld gHb Est-mCnc: 117 mg/dL
Hgb A1c MFr Bld: 5.7 % — ABNORMAL HIGH (ref 4.8–5.6)

## 2022-03-26 LAB — INSULIN, RANDOM: INSULIN: 5.2 u[IU]/mL (ref 2.6–24.9)

## 2022-03-26 LAB — VITAMIN B12: Vitamin B-12: 1594 pg/mL — ABNORMAL HIGH (ref 232–1245)

## 2022-04-01 NOTE — Progress Notes (Addendum)
Chief Complaint:   OBESITY Katrina Beard is here to discuss her progress with her obesity treatment plan along with follow-up of her obesity related diagnoses. Katrina Beard is on the Category 2 Plan and states she is following her eating plan approximately 75% of the time. Katrina Beard states she is not exercising.   Today's visit was #: 51 Starting weight: 231 lbs Starting date: 04/03/2019 Today's weight: 190 lbs Today's date: 03/25/2022 Total lbs lost to date: 41 lbs Total lbs lost since last in-office visit: +2 lbs  Interim History:  She currently on Wegovy 2.69m weekly injection- denies medication SE.  She estimates to follow Cat 2 Meal Plan at least 75% and has not been able to exercise on regular basis.  Of Note- she endorses increased stress at home- poor behavior of her 62year old.  Subjective:   1. Hypertension associated with type 2 diabetes mellitus ([place] Blood pressure was excellent at office visit.  She is currently on Norvasc 10 mg daily, Diovan 320 mg daily.   2. Vitamin D deficiency 10/01/2021, Vitamin D level 49.1. She is on ERgocalciferol- denies N/V/Muscle Weakness.  3. Type 2 diabetes mellitus with obesity ([place] Lab Results  Component Value Date   HGBA1C 5.7 (H) 03/25/2022   HGBA1C 5.2 10/01/2021   HGBA1C 5.5 04/15/2021    she is currently taking Wegovy 2.426monce weekly injection. She denies mass in neck, dysphagia, dyspepsia, persistent hoarseness, abdominal pain, or N/V/Constipation.  Assessment/Plan:   1. Hypertension associated with type 2 diabetes mellitus ([facility]Check labs today.  Request Norvasc 10 mg refill from PCP Dr WoStephanie Acre- Comprehensive metabolic panel  2. Vitamin D deficiency Check labs today.   Refill - Vitamin D, Ergocalciferol, (DRISDOL) 1.25 MG (50000 UNIT) CAPS capsule; Take 1 capsule (50,000 Units total) by mouth every 7 (seven) days.  Dispense: 4 capsule; Refill: 0  - VITAMIN D 25 Hydroxy (Vit-D Deficiency, Fractures)  3. Type 2  diabetes mellitus with obesity (HCC) Check labs today.   Refill - Semaglutide-Weight Management (WEGOVY) 2.4 MG/0.75ML SOAJ; Inject 2.4 mg into the skin once a week.  Dispense: 3 mL; Refill: 0  - Hemoglobin A1c - Insulin, random - Vitamin B12  4. Obesity with current BMI of 34.9 Refill - Semaglutide-Weight Management (WEGOVY) 2.4 MG/0.75ML SOAJ; Inject 2.4 mg into the skin once a week.  Dispense: 3 mL; Refill: 0  Katrina Beard is currently in the action stage of change. As such, her goal is to continue with weight loss efforts. She has agreed to the Category 2 Plan.   Exercise goals:  As is.   Behavioral modification strategies: increasing lean protein intake, decreasing simple carbohydrates, meal planning and cooking strategies, keeping healthy foods in the home, and planning for success.  Katrina Beard agreed to follow-up with our clinic in 4 weeks. She was informed of the importance of frequent follow-up visits to maximize her success with intensive lifestyle modifications for her multiple health conditions.   Katrina Beard was informed we would discuss her lab results at her next visit unless there is a critical issue that needs to be addressed sooner. Katrina Beard agreed to keep her next visit at the agreed upon time to discuss these results.  Objective:   Blood pressure 117/78, pulse 72, temperature 98.1 F (36.7 C), height 5' 2"$  (1.575 m), weight 190 lb (86.2 kg), SpO2 98 %. Body mass index is 34.75 kg/m.  General: Cooperative, alert, well developed, in no acute distress. HEENT: Conjunctivae and lids unremarkable. Cardiovascular: Regular rhythm.  Lungs: Normal work of breathing. Neurologic: No focal deficits.   Lab Results  Component Value Date   CREATININE 0.87 03/25/2022   BUN 14 03/25/2022   NA 141 03/25/2022   K 3.8 03/25/2022   CL 104 03/25/2022   CO2 22 03/25/2022   Lab Results  Component Value Date   ALT 35 (H) 03/25/2022   AST 24 03/25/2022   ALKPHOS 88 03/25/2022   BILITOT 0.5  03/25/2022   Lab Results  Component Value Date   HGBA1C 5.7 (H) 03/25/2022   HGBA1C 5.2 10/01/2021   HGBA1C 5.5 04/15/2021   HGBA1C 5.5 11/13/2020   HGBA1C 6.0 (H) 06/24/2020   Lab Results  Component Value Date   INSULIN 5.2 03/25/2022   INSULIN 6.1 10/01/2021   INSULIN 11.2 04/03/2019   Lab Results  Component Value Date   TSH 0.575 04/03/2019   Lab Results  Component Value Date   CHOL 161 04/15/2021   HDL 55 04/15/2021   LDLCALC 91 04/15/2021   TRIG 80 04/15/2021   Lab Results  Component Value Date   VD25OH 39.3 03/25/2022   VD25OH 49.1 10/01/2021   VD25OH 48.4 11/13/2020   Lab Results  Component Value Date   WBC 6.5 04/15/2021   HGB 13.0 04/15/2021   HCT 39 04/15/2021   MCV 86 06/24/2020   PLT 279 04/15/2021   Lab Results  Component Value Date   IRON 162 04/15/2021   TIBC 252 06/24/2020   FERRITIN 64 06/24/2020   Attestation Statements:   Reviewed by clinician on day of visit: allergies, medications, problem list, medical history, surgical history, family history, social history, and previous encounter notes.  I, Davy Pique, RMA, am acting as Location manager for Mina Marble, NP.  I have reviewed the above documentation for accuracy and completeness, and I agree with the above. -  Adrien Shankar d. Takari Duncombe, NP-C

## 2022-04-05 ENCOUNTER — Inpatient Hospital Stay: Payer: BC Managed Care – PPO | Attending: Hematology and Oncology | Admitting: Hematology and Oncology

## 2022-04-05 ENCOUNTER — Encounter: Payer: Self-pay | Admitting: Hematology and Oncology

## 2022-04-05 VITALS — BP 132/74 | HR 66 | Temp 97.9°F | Resp 16 | Ht 62.0 in | Wt 192.8 lb

## 2022-04-05 DIAGNOSIS — D0512 Intraductal carcinoma in situ of left breast: Secondary | ICD-10-CM | POA: Diagnosis present

## 2022-04-05 DIAGNOSIS — Z923 Personal history of irradiation: Secondary | ICD-10-CM | POA: Insufficient documentation

## 2022-04-05 DIAGNOSIS — C50412 Malignant neoplasm of upper-outer quadrant of left female breast: Secondary | ICD-10-CM

## 2022-04-05 DIAGNOSIS — Z79899 Other long term (current) drug therapy: Secondary | ICD-10-CM | POA: Diagnosis not present

## 2022-04-05 DIAGNOSIS — Z170 Estrogen receptor positive status [ER+]: Secondary | ICD-10-CM

## 2022-04-05 NOTE — Progress Notes (Signed)
SURVIVORSHIP VISIT:  BRIEF ONCOLOGIC HISTORY:   Oncology History  Malignant neoplasm of upper-outer quadrant of left breast in female, estrogen receptor positive (Katrina Beard)  05/15/2021 Initial Diagnosis   Left breast UOQ biopsy: DCIS with calcifications in the upper outer anterior calcifications.  ER 100%/PR70% Upper outer posterior biopsy: complex sclerosing lesion with atypia and calcifications.      06/11/2021 Surgery   Left lumpectomy: DCIS 1.5cm, margins negative, CSL with intraductal papilloma noted.   06/11/2021 Cancer Staging   Staging form: Breast, AJCC 8th Edition - Pathologic stage from 06/11/2021: Stage 0 (pTis (DCIS), pN0, cM0, ER+, PR+) - Signed by Gardenia Phlegm, NP on 10/01/2021   06/15/2021 Genetic Testing   CustomNext+RNA cancer panel found no pathogenic mutations.    The CustomNext-Cancer+RNAinsight panel offered by [facility] includes sequencing and rearrangement analysis for the following 47 genes:  APC, ATM, AXIN2, BARD1, BMPR1A, BRCA1, BRCA2, BRIP1, CDH1, CDK4, CDKN2A, CHEK2, DICER1, EPCAM, GREM1, HOXB13, MEN1, MLH1, MSH2, MSH3, MSH6, MUTYH, NBN, NF1, NF2, NTHL1, PALB2, PMS2, POLD1, POLE, PTEN, RAD51C, RAD51D, RECQL, RET, SDHA, SDHAF2, SDHB, SDHC, SDHD, SMAD4, SMARCA4, STK11, TP53, TSC1, TSC2, and VHL.  RNA data is routinely analyzed for use in variant interpretation for all genes.    07/16/2021 - 08/14/2021 Radiation Therapy   Site Technique Total Dose (Gy) Dose per Fx (Gy) Completed Fx Beam Energies  Breast, Left: Breast_L 3D 42.56/42.56 2.66 16/16 10X  Breast, Left: Breast_L_Bst 3D 8/8 2 4/4 10X     08/2021 -  Anti-estrogen oral therapy   Tamoxifen daily    INTERVAL HISTORY:   She is here for follow up. She is taking Tamoxifen well. She does report some loss of libido which is the only change. She has her mammogram scheduled for end of January. She denies any changes in the breast. No vaginal bleeding or LE swelling. Rest of the pertinent 10 point ROS  reviewed and neg.  REVIEW OF SYSTEMS:  Review of Systems  Constitutional:  Negative for appetite change, chills, fatigue, fever and unexpected weight change.  HENT:   Negative for hearing loss, lump/mass and trouble swallowing.   Eyes:  Negative for eye problems and icterus.  Respiratory:  Negative for chest tightness, cough and shortness of breath.   Cardiovascular:  Negative for chest pain, leg swelling and palpitations.  Gastrointestinal:  Negative for abdominal distention, abdominal pain, constipation, diarrhea, nausea and vomiting.  Endocrine: Negative for hot flashes.  Genitourinary:  Negative for difficulty urinating.   Musculoskeletal:  Negative for arthralgias.  Skin:  Negative for itching and rash.  Neurological:  Negative for dizziness, extremity weakness, headaches and numbness.  Hematological:  Negative for adenopathy. Does not bruise/bleed easily.  Psychiatric/Behavioral:  Negative for depression. The patient is not nervous/anxious.   Breast: Denies any new nodularity, masses, tenderness, nipple changes, or nipple discharge.    ONCOLOGY TREATMENT TEAM:  1. Surgeon:  Dr. Brantley Stage at [facility] 2. Medical Oncologist: Dr. Chryl Heck 3. Radiation Oncologist: Dr. Lisbeth Renshaw    PAST MEDICAL/SURGICAL HISTORY:  Past Medical History:  Diagnosis Date   Asthma    Breast cancer ([place])    CAD (coronary artery disease)    CHF (congestive heart failure) (HCC)    Diabetes mellitus    Gallbladder problem    GERD (gastroesophageal reflux disease)    HLD (hyperlipidemia)    Hypertension    Joint pain    Kidney stone    Knee pain    MI, old 2003   no stent placement  Vitamin D deficiency    Past Surgical History:  Procedure Laterality Date   BREAST EXCISIONAL BIOPSY Bilateral    BREAST LUMPECTOMY WITH RADIOACTIVE SEED LOCALIZATION Left 06/11/2021   Procedure: LEFT BREAST LUMPECTOMY WITH RADIOACTIVE SEED LOCALIZATION X2;  Surgeon: Erroll Luna, MD;  Location: [place];  Service: General;  Laterality: Left;   BREAST SURGERY     CHOLECYSTECTOMY     CYSTOSCOPY W/ URETERAL STENT PLACEMENT Right 07/25/2016   Procedure: CYSTOSCOPY WITH RETROGRADE PYELOGRAM/LEFT URETERAL STENT PLACEMENT;  Surgeon: Ardis Hughs, MD;  Location: [facility];  Service: Urology;  Laterality: Right;   HAND SURGERY     ROUX-EN-Y GASTRIC BYPASS     07/15/2014     ALLERGIES:  Allergies  Allergen Reactions   Ibuprofen Other (See Comments)    Gastric bypass surgery-can't take anymore   Lisinopril-Hydrochlorothiazide Other (See Comments)    Arm numbness and tongue blisters     CURRENT MEDICATIONS:  Outpatient Encounter Medications as of 04/05/2022  Medication Sig   amLODipine (NORVASC) 10 MG tablet Take 1 tablet (10 mg total) by mouth daily. RF per PCP-Wolters in future   aspirin EC 81 MG tablet Take 81 mg by mouth daily.   atorvastatin (LIPITOR) 20 MG tablet Take 20 mg by mouth daily.   blood glucose meter kit and supplies KIT Dispense based on patient and insurance preference. Use up to four times daily as directed.   ferrous sulfate (FERROUSUL) 325 (65 FE) MG tablet Take 1 tablet (325 mg total) by mouth daily with breakfast.   fluticasone (FLONASE) 50 MCG/ACT nasal spray Place 1 spray into both nostrils in the morning and at bedtime.   Multiple Vitamins-Iron (MULTIVITAMIN/IRON PO) Take by mouth.   Semaglutide-Weight Management (WEGOVY) 2.4 MG/0.75ML SOAJ Inject 2.4 mg into the skin once a week.   tamoxifen (NOLVADEX) 20 MG tablet Take 1 tablet by mouth once daily   valsartan (DIOVAN) 320 MG tablet Take 320 mg by mouth daily.   Vitamin D, Ergocalciferol, (DRISDOL) 1.25 MG (50000 UNIT) CAPS capsule Take 1 capsule (50,000 Units total) by mouth every 7 (seven) days.   No facility-administered encounter medications on file as of 04/05/2022.     ONCOLOGIC FAMILY HISTORY:  Family History  Problem Relation Age of Onset   Breast cancer Mother 92   Hypertension  Mother    Hyperlipidemia Mother    Obesity Mother    Hypertension Father    Heart disease Father      SOCIAL HISTORY:  Social History   Socioeconomic History   Marital status: Single    Spouse name: Not on file   Number of children: Not on file   Years of education: Not on file   Highest education level: Not on file  Occupational History   Occupation: Teacher, early years/pre  Tobacco Use   Smoking status: Never   Smokeless tobacco: Never  Vaping Use   Vaping Use: Never used  Substance and Sexual Activity   Alcohol use: No   Drug use: No   Sexual activity: Not on file  Other Topics Concern   Not on file  Social History Narrative   Not on file   Social Determinants of Health   Financial Resource Strain: Not on file  Food Insecurity: Not on file  Transportation Needs: Not on file  Physical Activity: Not on file  Stress: Not on file  Social Connections: Not on file  Intimate Partner Violence: Not on file     OBSERVATIONS/OBJECTIVE:  BP 132/74 (BP Location: Left Arm, Patient Position: Sitting)   Pulse 66   Temp 97.9 F (36.6 C) (Temporal)   Resp 16   Ht '5\' 2"'$  (1.575 m)   Wt 192 lb 12.8 oz (87.5 kg)   SpO2 100%   BMI 35.26 kg/m  GENERAL: Patient is a well appearing female in no acute distress HEENT:  Sclerae anicteric.  Oropharynx clear and moist. No ulcerations or evidence of oropharyngeal candidiasis. Neck is supple.  NODES:  No cervical, supraclavicular, or axillary lymphadenopathy palpated.  BREAST EXAM:  left breast s/p lumpectomy and radiation, no sign of local recurrence, very large seroma noted in left breast, right breast benign LUNGS:  Clear to auscultation bilaterally.  No wheezes or rhonchi. HEART:  Regular rate and rhythm. No murmur appreciated. ABDOMEN:  Soft, nontender.  Positive, normoactive bowel sounds. No organomegaly palpated. MSK:  No focal spinal tenderness to palpation. Full range of motion bilaterally in the upper extremities. EXTREMITIES:   + left leg edema. SKIN:  Clear with no obvious rashes or skin changes. No nail dyscrasia. NEURO:  Nonfocal. Well oriented.  Appropriate affect.   LABORATORY DATA:  None for this visit.  DIAGNOSTIC IMAGING:  None for this visit.   ASSESSMENT AND PLAN:  Ms.. Katrina Beard is a pleasant 62 y.o. female with Stage 0 left DCIS, ER+/PR+, diagnosed in 04/2021, treated with lumpectomy, adjuvant radiation therapy, and anti-estrogen therapy with Tamoxifen beginning in 08/2021. She is here for follow up. She denies any new health issues except loss of libido.   No concerns on PE except chronically inverted nipple, hyperpigmentation of left breast and scar tissue. Mammogram scheduled for end of January Encouraged SBE monthly. Continue Tamoxifen as prescribed. Recommended exercise 30 min a day 5 times a week RTC in August 2024.  The patient was provided an opportunity to ask questions and all were answered. The patient agreed with the plan and demonstrated an understanding of the instructions.   Total encounter time:30 minutes*in face-to-face visit time, chart review, lab review, care coordination, order entry, and documentation of the encounter time. *Total Encounter Time as defined by the Centers for Medicare and Medicaid Services includes, in addition to the face-to-face time of a patient visit (documented in the note above) non-face-to-face time: obtaining and reviewing outside history, ordering and reviewing medications, tests or procedures, care coordination (communications with other health care professionals or caregivers) and documentation in the medical record.

## 2022-04-12 ENCOUNTER — Other Ambulatory Visit: Payer: Self-pay | Admitting: Adult Health

## 2022-04-12 ENCOUNTER — Ambulatory Visit
Admission: RE | Admit: 2022-04-12 | Discharge: 2022-04-12 | Disposition: A | Discharge status: Home / Self Care | Payer: BC Managed Care – PPO | Source: Ambulatory Visit | Attending: Adult Health | Admitting: Adult Health

## 2022-04-12 DIAGNOSIS — R921 Mammographic calcification found on diagnostic imaging of breast: Secondary | ICD-10-CM

## 2022-04-12 DIAGNOSIS — C50412 Malignant neoplasm of upper-outer quadrant of left female breast: Secondary | ICD-10-CM

## 2022-04-12 HISTORY — DX: Personal history of irradiation: Z92.3

## 2022-04-19 ENCOUNTER — Ambulatory Visit
Admission: RE | Admit: 2022-04-19 | Discharge: 2022-04-19 | Disposition: A | Discharge status: Home / Self Care | Payer: BC Managed Care – PPO | Source: Ambulatory Visit | Attending: Adult Health | Admitting: Adult Health

## 2022-04-19 DIAGNOSIS — R921 Mammographic calcification found on diagnostic imaging of breast: Secondary | ICD-10-CM

## 2022-04-19 HISTORY — PX: BREAST BIOPSY: SHX20

## 2022-04-20 ENCOUNTER — Other Ambulatory Visit: Payer: Self-pay | Admitting: Hematology and Oncology

## 2022-04-27 ENCOUNTER — Encounter (INDEPENDENT_AMBULATORY_CARE_PROVIDER_SITE_OTHER): Payer: Self-pay | Admitting: Adult Health

## 2022-04-27 ENCOUNTER — Ambulatory Visit (INDEPENDENT_AMBULATORY_CARE_PROVIDER_SITE_OTHER): Payer: BC Managed Care – PPO | Admitting: Adult Health

## 2022-04-27 VITALS — BP 124/70 | HR 71 | Temp 97.8°F | Ht 62.0 in | Wt 189.0 lb

## 2022-04-27 DIAGNOSIS — E669 Obesity, unspecified: Secondary | ICD-10-CM

## 2022-04-27 DIAGNOSIS — Z7985 Long-term (current) use of injectable non-insulin antidiabetic drugs: Secondary | ICD-10-CM

## 2022-04-27 DIAGNOSIS — E559 Vitamin D deficiency, unspecified: Secondary | ICD-10-CM | POA: Diagnosis not present

## 2022-04-27 DIAGNOSIS — E1159 Type 2 diabetes mellitus with other circulatory complications: Secondary | ICD-10-CM

## 2022-04-27 DIAGNOSIS — Z6834 Body mass index (BMI) 34.0-34.9, adult: Secondary | ICD-10-CM

## 2022-04-27 DIAGNOSIS — I152 Hypertension secondary to endocrine disorders: Secondary | ICD-10-CM

## 2022-04-27 DIAGNOSIS — E1169 Type 2 diabetes mellitus with other specified complication: Secondary | ICD-10-CM | POA: Diagnosis not present

## 2022-04-27 MED ORDER — WEGOVY 2.4 MG/0.75ML ~~LOC~~ SOAJ: 2.4000 mg | SUBCUTANEOUS | 0 refills | Status: DC

## 2022-04-27 MED ORDER — VITAMIN D (ERGOCALCIFEROL) 1.25 MG (50000 UNIT) PO CAPS: 50000.0000 [IU] | ORAL_CAPSULE | ORAL | 0 refills | Status: DC

## 2022-04-27 NOTE — Progress Notes (Signed)
Chief Complaint:   OBESITY Katrina Beard is here to discuss her progress with her obesity treatment plan along with follow-up of her obesity related diagnoses. Katrina Beard is on the Category 2 Plan and states she is following her eating plan approximately 75% of the time.  Katrina Beard states she is nit currently exercising due to acute R shoulder pain.  Today's visit was #: 43 Starting weight: 231 lbs Starting date: 04/03/2019 Today's weight: 189 lbs Today's date: 04/27/2022 Total lbs lost to date: 42 lbs Total lbs lost since last in-office visit: - l lbs  Interim History:  Katrina Beard reports increased R shoulder pain r/t rotator cuff tear, unsure of severity.  She declined surgical repair and prefers rest, immobilization (in sling at OV today), and patience to recover.  She was see by PCP/Katrina Beard yesterday- treat for otitis media Clotrimazole-Betamethasone 0000000 %1 application to affected area Externally Twice a day for 14 days 04/26/2022 ActiveZithromax Z-Pak 250 MG2 tablet on the first day, then 1 tablet daily for 4 days Orally Once a day for 5 day(s)  Subjective:   1. Type 2 diabetes mellitus with obesity (Katrina Beard) Discussed labs Lab Results  Component Value Date   HGBA1C 5.7 (H) 03/25/2022   HGBA1C 5.2 10/01/2021   HGBA1C 5.5 04/15/2021   She is on Wegovy 2.59m- endorses increase in GERD sx's and recently started OTC PPI. She denies hematochezia, N/V/Constipation. [facility]. Discussed risks/benefits of converting from GIP-1 to GIP/GLP-1 therapy. 03/25/22 B12- 1594- she reports taking daily OTC Multivitamin.  2. Vitamin D deficiency Discussed labs  Latest Reference Range & Units 03/25/22 10:42  Vitamin D, 25-Hydroxy 30.0 - 100.0 ng/mL 39.3  She is on weekly Ergocalciferol- denies N/V/Muscle Weakness.  3. Hypertension associated with type 2 diabetes mellitus ([place] Discussed labs. 03/25/22 CMP- electrolytes and kidney  stable. BP at goal. PCP will assume management of Amlodipine 156mPCP already manages daily Valsartan 32059mAssessment/Plan:   1. Type 2 diabetes mellitus with obesity (HCC)  - Semaglutide-Weight Management (WEGOVY) 2.4 MG/0.75ML SOAJ; Inject 2.4 mg into the skin once a week.  Dispense: 3 mL; Refill: 0  If GI sx's resolve CONSIDER replacing Wegogy with Mounjaro in future.  Take OTC Multivitamin every other day.  2. Vitamin D deficiency  - Vitamin D, Ergocalciferol, (DRISDOL) 1.25 MG (50000 UNIT) CAPS capsule; Take 1 capsule (50,000 Units total) by mouth every 7 (seven) days.  Dispense: 4 capsule; Refill: 0  3. Hypertension associated with type 2 diabetes mellitus ([place]hypert[place] therapy per PCP.  4. Obesity with current BMI of 34.7  Katrina Beard is currently in the action stage of change. As such, her goal is to continue with weight loss efforts. She has agreed to the Category 2 Plan.   Exercise goals: All adults should avoid inactivity. Some physical activity is better than none, and adults who participate in any amount of physical activity gain some health benefits.  Behavioral modification strategies: increasing lean protein intake, decreasing simple carbohydrates, increasing vegetables, increasing water intake, meal planning and cooking strategies, keeping healthy foods in the home, and planning for success.  OllSamorahs agreed to follow-up with our clinic in 4 weeks. She was informed of the importance of frequent follow-up visits to maximize her success with intensive lifestyle modifications for her multiple health conditions.   Objective:   Blood pressure 124/70, pulse 71, temperature 97.8 F (36.6 C), height 5' 2"$  (1.575 m), weight 189  lb (85.7 kg), SpO2 97 %. Body mass index is 34.57 kg/m.  General: Cooperative, alert, well developed, in no acute distress. HEENT: Conjunctivae and lids unremarkable. Cardiovascular: Regular rhythm.  Lungs: Normal work of  breathing. Neurologic: No focal deficits.   Lab Results  Component Value Date   CREATININE 0.87 03/25/2022   BUN 14 03/25/2022   NA 141 03/25/2022   K 3.8 03/25/2022   CL 104 03/25/2022   CO2 22 03/25/2022   Lab Results  Component Value Date   ALT 35 (H) 03/25/2022   AST 24 03/25/2022   ALKPHOS 88 03/25/2022   BILITOT 0.5 03/25/2022   Lab Results  Component Value Date   HGBA1C 5.7 (H) 03/25/2022   HGBA1C 5.2 10/01/2021   HGBA1C 5.5 04/15/2021   HGBA1C 5.5 11/13/2020   HGBA1C 6.0 (H) 06/24/2020   Lab Results  Component Value Date   INSULIN 5.2 03/25/2022   INSULIN 6.1 10/01/2021   INSULIN 11.2 04/03/2019   Lab Results  Component Value Date   TSH 0.575 04/03/2019   Lab Results  Component Value Date   CHOL 161 04/15/2021   HDL 55 04/15/2021   LDLCALC 91 04/15/2021   TRIG 80 04/15/2021   Lab Results  Component Value Date   VD25OH 39.3 03/25/2022   VD25OH 49.1 10/01/2021   VD25OH 48.4 11/13/2020   Lab Results  Component Value Date   WBC 6.5 04/15/2021   HGB 13.0 04/15/2021   HCT 39 04/15/2021   MCV 86 06/24/2020   PLT 279 04/15/2021   Lab Results  Component Value Date   IRON 162 04/15/2021   TIBC 252 06/24/2020   FERRITIN 64 06/24/2020    Attestation Statements:   Reviewed by clinician on day of visit: allergies, medications, problem list, medical history, surgical history, family history, social history, and previous encounter notes.   I have reviewed the above documentation for accuracy and completeness, and I agree with the above. -  Nakul Avino d. Simrit Gohlke, NP-C

## 2022-04-30 ENCOUNTER — Ambulatory Visit: Payer: Self-pay | Admitting: Surgery

## 2022-04-30 DIAGNOSIS — N6092 Unspecified benign mammary dysplasia of left breast: Secondary | ICD-10-CM

## 2022-05-04 ENCOUNTER — Encounter: Payer: Self-pay | Admitting: Hematology and Oncology

## 2022-05-04 ENCOUNTER — Other Ambulatory Visit: Payer: Self-pay | Admitting: Surgery

## 2022-05-04 DIAGNOSIS — N6092 Unspecified benign mammary dysplasia of left breast: Secondary | ICD-10-CM

## 2022-05-20 ENCOUNTER — Other Ambulatory Visit: Payer: Self-pay | Admitting: Hematology and Oncology

## 2022-05-25 ENCOUNTER — Encounter (INDEPENDENT_AMBULATORY_CARE_PROVIDER_SITE_OTHER): Payer: Self-pay

## 2022-05-25 ENCOUNTER — Ambulatory Visit (INDEPENDENT_AMBULATORY_CARE_PROVIDER_SITE_OTHER): Payer: BC Managed Care – PPO | Admitting: Adult Health

## 2022-05-27 ENCOUNTER — Encounter (INDEPENDENT_AMBULATORY_CARE_PROVIDER_SITE_OTHER): Payer: Self-pay | Admitting: Physician Assistant

## 2022-05-27 ENCOUNTER — Ambulatory Visit (INDEPENDENT_AMBULATORY_CARE_PROVIDER_SITE_OTHER): Payer: BC Managed Care – PPO | Admitting: Physician Assistant

## 2022-05-27 VITALS — BP 153/85 | HR 68 | Temp 97.7°F | Ht 62.0 in | Wt 189.0 lb

## 2022-05-27 DIAGNOSIS — E1169 Type 2 diabetes mellitus with other specified complication: Secondary | ICD-10-CM | POA: Diagnosis not present

## 2022-05-27 DIAGNOSIS — E1159 Type 2 diabetes mellitus with other circulatory complications: Secondary | ICD-10-CM | POA: Diagnosis not present

## 2022-05-27 DIAGNOSIS — Z6834 Body mass index (BMI) 34.0-34.9, adult: Secondary | ICD-10-CM

## 2022-05-27 DIAGNOSIS — I152 Hypertension secondary to endocrine disorders: Secondary | ICD-10-CM | POA: Diagnosis not present

## 2022-05-27 DIAGNOSIS — E559 Vitamin D deficiency, unspecified: Secondary | ICD-10-CM

## 2022-05-27 DIAGNOSIS — Z7985 Long-term (current) use of injectable non-insulin antidiabetic drugs: Secondary | ICD-10-CM

## 2022-05-27 MED ORDER — TIRZEPATIDE 7.5 MG/0.5ML ~~LOC~~ SOAJ: 7.5000 mg | SUBCUTANEOUS | 0 refills | Status: DC

## 2022-05-27 MED ORDER — VITAMIN D (ERGOCALCIFEROL) 1.25 MG (50000 UNIT) PO CAPS: 50000.0000 [IU] | ORAL_CAPSULE | ORAL | 0 refills | Status: DC

## 2022-05-27 NOTE — Progress Notes (Signed)
Office: [phone]  /  Fax: [phone]  WEIGHT SUMMARY AND BIOMETRICS  Vitals Temp: 97.7 F (36.5 C) BP: (!) 153/85 Pulse Rate: 68 SpO2: 98 %   Anthropometric Measurements Height: '5\' 2"'$  (1.575 m) Weight: 189 lb (85.7 kg) BMI (Calculated): 34.56 Weight at Last Visit: 189 lb Weight Lost Since Last Visit: 0 lb Starting Weight: 231 lb   Body Composition  Body Fat %: 46.4 % Fat Mass (lbs): 87.8 lbs Muscle Mass (lbs): 96.4 lbs Total Body Water (lbs): 79.2 lbs Visceral Fat Rating : 13   Other Clinical Data Fasting: no Labs: no Today's Visit #: 44 Starting Date: 04/03/19     HPI  Chief Complaint: OBESITY  Katrina Beard is here to discuss her progress with her obesity treatment plan. She is on the practicing portion control and making smarter food choices, such as increasing vegetables and decreasing simple carbohydrates and states she is following her eating plan approximately 60 % of the time. She states she is exercising 0 minutes 0 times per week.   Interval History:  Since last office visit she maintained well. She was having increased reflux symptoms at last visit, started Nexium and this has improved/resolved.  On Wegovy 2.4 mg weekly. Not skipping meals, supplements with protein shake if no time for meal.   Continued R shoulder pain/rotator cuff tear- Trying to hold off on surgery as just returned to work following treatment for breast cancer.  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 Foster's children in her home as well.   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 Brantley Stage for atypical ductal hyperplasia .  Pharmacotherapy: Wegovy 2.4 mg weekly. Reflux was main side effect and has improved with Nexium daily. No other side effects with Wegovy.    PHYSICAL EXAM:  Blood pressure (!) 153/85, pulse 68, temperature 97.7 F (36.5 C), height '5\' 2"'$  (1.575 m), weight 189 lb (85.7 kg), SpO2 98 %. Body mass index is 34.57  kg/m.  General: She is overweight, cooperative, alert, well developed, and in no acute distress. PSYCH: Has normal mood, affect and thought process.   Lungs: Normal breathing effort, no conversational dyspnea.  DIAGNOSTIC DATA REVIEWED:  BMET    Component Value Date/Time   NA 141 03/25/2022 1042   K 3.8 03/25/2022 1042   CL 104 03/25/2022 1042   CO2 22 03/25/2022 1042   GLUCOSE 78 03/25/2022 1042   GLUCOSE 102 (H) 06/05/2021 1256   BUN 14 03/25/2022 1042   CREATININE 0.87 03/25/2022 1042   CALCIUM 8.8 03/25/2022 1042   GFRNONAA 49 (L) 06/05/2021 1256   GFRAA 77 08/06/2019 1155   Lab Results  Component Value Date   HGBA1C 5.7 (H) 03/25/2022   HGBA1C 7.2 (H) 04/03/2019   Lab Results  Component Value Date   INSULIN 5.2 03/25/2022   INSULIN 11.2 04/03/2019   Lab Results  Component Value Date   TSH 0.575 04/03/2019   CBC    Component Value Date/Time   WBC 6.5 04/15/2021 0000   WBC 9.6 07/24/2016 1309   RBC 4.29 04/15/2021 0000   HGB 13.0 04/15/2021 0000   HGB 11.7 06/24/2020 1025   HCT 39 04/15/2021 0000   HCT 35.1 06/24/2020 1025   PLT 279 04/15/2021 0000   PLT 357 06/24/2020 1025   MCV 86 06/24/2020 1025   MCH 28.7 06/24/2020 1025   MCH 28.0 07/24/2016 1309   MCHC 33.3 06/24/2020 1025   MCHC 33.0 07/24/2016 1309   RDW 14.9 06/24/2020 1025  Iron Studies    Component Value Date/Time   IRON 162 04/15/2021 0000   TIBC 252 06/24/2020 1025   FERRITIN 64 06/24/2020 1025   IRONPCTSAT 32 06/24/2020 1025   Lipid Panel     Component Value Date/Time   CHOL 161 04/15/2021 0000   CHOL 133 06/24/2020 1025   TRIG 80 04/15/2021 0000   HDL 55 04/15/2021 0000   HDL 43 06/24/2020 1025   LDLCALC 91 04/15/2021 0000   LDLCALC 75 06/24/2020 1025   Hepatic Function Panel     Component Value Date/Time   PROT 6.7 03/25/2022 1042   ALBUMIN 3.8 03/25/2022 1042   AST 24 03/25/2022 1042   ALT 35 (H) 03/25/2022 1042   ALKPHOS 88 03/25/2022 1042   BILITOT 0.5  03/25/2022 1042      Component Value Date/Time   TSH 0.575 04/03/2019 1329   Nutritional Lab Results  Component Value Date   VD25OH 39.3 03/25/2022   VD25OH 49.1 10/01/2021   VD25OH 48.4 11/13/2020    ASSOCIATED CONDITIONS ADDRESSED TODAY  ASSESSMENT AND PLAN  Problem List Items Addressed This Visit     Class 3 severe obesity with serious comorbidity and body mass index (BMI) of 40.0 to 44.9 in adult [facility])   Relevant Medications   tirzepatide (MOUNJARO) 7.5 MG/0.5ML Pen   Hypertension associated with type 2 diabetes mellitus (HCC)    On valsartan 320 mg daily, amlodipine 10 mg daily without side effects. BP elevated today, but reports anxious, rushing to get to appointment this morning as missed previous appointment. Advised to decrease sodium in diet, hydrate well. Monitor closely over the next month.  Continue nutrition plan to promote weight loss and improve BP control.       Relevant Medications   tirzepatide (MOUNJARO) 7.5 MG/0.5ML Pen   Vitamin D deficiency    Vitamin D Deficiency Vitamin D is not at goal of 50.  Most recent vitamin D level was 39.3. She is on  prescription ergocalciferol 50,000 IU weekly. No side effects, no N/V or muscle weakness with Ergocalciferol.  Lab Results  Component Value Date   VD25OH 39.3 03/25/2022   VD25OH 49.1 10/01/2021   VD25OH 48.4 11/13/2020   Plan: Refill prescription vitamin D 50,000 IU weekly. Recheck Vit D level at least 2-3 times yearly to avoid over supplementation.        Relevant Medications   Vitamin D, Ergocalciferol, (DRISDOL) 1.25 MG (50000 UNIT) CAPS capsule   Type 2 diabetes mellitus with obesity ([place]) - Primary    Type 2 Diabetes Mellitus with other specified complication, without long-term current use of insulin HgbA1c is at goal. Last A1c was 5.7 CBGs: Fasting 110's Episodes of hypoglycemia: no Medication(s): Wegovy 2.4 mg SQ weekly  Lab Results  Component Value Date   HGBA1C 5.7 (H) 03/25/2022    HGBA1C 5.2 10/01/2021   HGBA1C 5.5 04/15/2021   Lab Results  Component Value Date   MICROALBUR <0.7 04/15/2021   [place] 91 04/15/2021   CREATININE 0.87 03/25/2022  No results found for: "GFR"  Plan: We discussed change to [facility] for Type 2 diabetes, risks and benefits of switch from GLP-1 therapy to GIP/GLP-1 therapy and the patient is agreeable to change to [facility] at this time. Discussed may need to complete a PA for [facility]. She reports she has another [place] pen currently and will continue Wegovy until [place] obtained.  Change to Mounjaro 7.5 mg SQ weekly  Patient denies a personal or family history of pancreatitis, medullary thyroid  carcinoma or multiple endocrine neoplasia type II.         Relevant Medications   tirzepatide (MOUNJARO) 7.5 MG/0.5ML Pen   BMI 34.0-34.9,adult Current BMI 34.6      TREATMENT PLAN FOR OBESITY:  Recommended Dietary Goals  Demeatrice is currently in the action stage of change. As such, her goal is to continue weight management plan. She has agreed to the Category 2 Plan.  Behavioral Intervention  We discussed the following Behavioral Modification Strategies today: increasing lean protein intake, increasing vegetables, avoiding skipping meals, and increasing water intake.  Additional resources provided today: NA  Recommended Physical Activity Goals  Anetha has been advised to work up to 150 minutes of moderate intensity aerobic activity a week and strengthening exercises 2-3 times per week for cardiovascular health, weight loss maintenance and preservation of muscle mass.   She has agreed to Continue current level of physical activity    Pharmacotherapy We discussed various medication options to help Damyah with her weight loss efforts and we both agreed to change from [facility] to [facility] for primary indication of Type 2 diabetes.    Return in about 4 weeks (around 06/24/2022).Marland Kitchen She was informed of the importance of frequent follow up  visits to maximize her success with intensive lifestyle modifications for her multiple health conditions.   ATTESTASTION STATEMENTS:  Reviewed by clinician on day of visit: allergies, medications, problem list, medical history, surgical history, family history, social history, and previous encounter notes.   I have personally spent 35 minutes total time today in preparation, patient care, nutritional counseling and documentation for this visit, including the following: review of clinical lab tests; review of medical tests/procedures/services.      Ender Rorke, PA-C

## 2022-05-27 NOTE — Assessment & Plan Note (Signed)
On valsartan 320 mg daily, amlodipine 10 mg daily without side effects. BP elevated today, but reports anxious, rushing to get to appointment this morning as missed previous appointment. Advised to decrease sodium in diet, hydrate well. Monitor closely over the next month.  Continue nutrition plan to promote weight loss and improve BP control.

## 2022-05-27 NOTE — Assessment & Plan Note (Signed)
Vitamin D Deficiency Vitamin D is not at goal of 50.  Most recent vitamin D level was 39.3. She is on  prescription ergocalciferol 50,000 IU weekly. No side effects, no N/V or muscle weakness with Ergocalciferol.  Lab Results  Component Value Date   VD25OH 39.3 03/25/2022   VD25OH 49.1 10/01/2021   VD25OH 48.4 11/13/2020    Plan: Refill prescription vitamin D 50,000 IU weekly. Recheck Vit D level at least 2-3 times yearly to avoid over supplementation.

## 2022-05-27 NOTE — Assessment & Plan Note (Signed)
Type 2 Diabetes Mellitus with other specified complication, without long-term current use of insulin HgbA1c is at goal. Last A1c was 5.7 CBGs: Fasting 110's Episodes of hypoglycemia: no Medication(s): Wegovy 2.4 mg SQ weekly  Lab Results  Component Value Date   HGBA1C 5.7 (H) 03/25/2022   HGBA1C 5.2 10/01/2021   HGBA1C 5.5 04/15/2021   Lab Results  Component Value Date   MICROALBUR <0.7 04/15/2021   [place] 91 04/15/2021   CREATININE 0.87 03/25/2022   No results found for: "GFR"  Plan: We discussed change to [facility] for Type 2 diabetes, risks and benefits of switch from GLP-1 therapy to GIP/GLP-1 therapy and the patient is agreeable to change to [facility] at this time. Discussed may need to complete a PA for [facility]. She reports she has another [place] pen currently and will continue Wegovy until [place] obtained.  Change to Mounjaro 7.5 mg SQ weekly  Patient denies a personal or family history of pancreatitis, medullary thyroid carcinoma or multiple endocrine neoplasia type II.

## 2022-06-15 ENCOUNTER — Encounter (HOSPITAL_BASED_OUTPATIENT_CLINIC_OR_DEPARTMENT_OTHER): Payer: Self-pay | Admitting: Surgery

## 2022-06-15 ENCOUNTER — Other Ambulatory Visit: Payer: Self-pay

## 2022-06-16 ENCOUNTER — Encounter (HOSPITAL_BASED_OUTPATIENT_CLINIC_OR_DEPARTMENT_OTHER)
Admission: RE | Admit: 2022-06-16 | Discharge: 2022-06-16 | Disposition: A | Discharge status: Home / Self Care | Payer: BC Managed Care – PPO | Source: Ambulatory Visit | Attending: Surgery | Admitting: Surgery

## 2022-06-16 DIAGNOSIS — Z01818 Encounter for other preprocedural examination: Secondary | ICD-10-CM | POA: Insufficient documentation

## 2022-06-16 LAB — BASIC METABOLIC PANEL
Anion gap: 12 (ref 5–15)
BUN: 15 mg/dL (ref 6–20)
CO2: 26 mmol/L (ref 22–32)
Calcium: 8.5 mg/dL — ABNORMAL LOW (ref 8.9–10.3)
Chloride: 105 mmol/L (ref 98–111)
Creatinine, Ser: 1.06 mg/dL — ABNORMAL HIGH (ref 0.44–1.00)
GFR, Estimated: >60 mL/min (ref >60)
Glucose, Bld: 98 mg/dL (ref 70–99)
Potassium: 3.7 mmol/L (ref 3.5–5.1)
Sodium: 143 mmol/L (ref 135–145)

## 2022-06-16 NOTE — Progress Notes (Signed)
       Patient Instructions  The night before surgery:  No food after midnight. ONLY clear liquids after midnight  The day of surgery (if you do NOT have diabetes):  Drink ONE (1) Pre-Surgery Clear Ensure as directed.   This drink was given to you during your hospital  pre-op appointment visit. The pre-op nurse will instruct you on the time to drink the  Pre-Surgery Ensure depending on your surgery time. Finish the drink at the designated time by the pre-op nurse.  Nothing else to drink after completing the  Pre-Surgery Clear Ensure.  The day of surgery (if you have diabetes): Drink ONE (1) Gatorade 2 (G2) as directed. This drink was given to you during your hospital  pre-op appointment visit.  The pre-op nurse will instruct you on the time to drink the   Gatorade 2 (G2) depending on your surgery time. Color of the Gatorade may vary. Red is not allowed. Nothing else to drink after completing the  Gatorade 2 (G2).         If you have questions, please contact your surgeon's office.Patient was provided with CHG cleanser to use at home before the procedure. Patient verbalized understanding of instructions.Patient was provided with CHG cleanser to use at home before the procedure. Patient verbalized understanding of instructions. 

## 2022-06-21 ENCOUNTER — Ambulatory Visit
Admission: RE | Admit: 2022-06-21 | Discharge: 2022-06-21 | Disposition: A | Discharge status: Home / Self Care | Payer: BC Managed Care – PPO | Source: Ambulatory Visit | Attending: Surgery | Admitting: Surgery

## 2022-06-21 DIAGNOSIS — N6092 Unspecified benign mammary dysplasia of left breast: Secondary | ICD-10-CM

## 2022-06-21 HISTORY — PX: BREAST BIOPSY: SHX20

## 2022-06-22 ENCOUNTER — Other Ambulatory Visit: Payer: Self-pay

## 2022-06-22 ENCOUNTER — Encounter (HOSPITAL_BASED_OUTPATIENT_CLINIC_OR_DEPARTMENT_OTHER): Payer: Self-pay | Admitting: Surgery

## 2022-06-22 ENCOUNTER — Encounter (HOSPITAL_BASED_OUTPATIENT_CLINIC_OR_DEPARTMENT_OTHER)
Admission: RE | Disposition: A | Discharge status: Home / Self Care | Payer: Self-pay | Source: Ambulatory Visit | Attending: Surgery

## 2022-06-22 ENCOUNTER — Ambulatory Visit (HOSPITAL_BASED_OUTPATIENT_CLINIC_OR_DEPARTMENT_OTHER): Payer: BC Managed Care – PPO | Admitting: Anesthesiology

## 2022-06-22 ENCOUNTER — Ambulatory Visit
Admission: RE | Admit: 2022-06-22 | Discharge: 2022-06-22 | Disposition: A | Discharge status: Home / Self Care | Payer: BC Managed Care – PPO | Source: Ambulatory Visit | Attending: Surgery | Admitting: Surgery

## 2022-06-22 ENCOUNTER — Ambulatory Visit (HOSPITAL_BASED_OUTPATIENT_CLINIC_OR_DEPARTMENT_OTHER)
Admission: RE | Admit: 2022-06-22 | Discharge: 2022-06-22 | Disposition: A | Discharge status: Home / Self Care | Payer: BC Managed Care – PPO | Source: Ambulatory Visit | Attending: Surgery | Admitting: Surgery

## 2022-06-22 DIAGNOSIS — N6092 Unspecified benign mammary dysplasia of left breast: Secondary | ICD-10-CM

## 2022-06-22 DIAGNOSIS — I2510 Atherosclerotic heart disease of native coronary artery without angina pectoris: Secondary | ICD-10-CM | POA: Diagnosis not present

## 2022-06-22 DIAGNOSIS — E669 Obesity, unspecified: Secondary | ICD-10-CM

## 2022-06-22 DIAGNOSIS — Z6835 Body mass index (BMI) 35.0-35.9, adult: Secondary | ICD-10-CM | POA: Diagnosis not present

## 2022-06-22 DIAGNOSIS — Z853 Personal history of malignant neoplasm of breast: Secondary | ICD-10-CM | POA: Diagnosis not present

## 2022-06-22 DIAGNOSIS — Z8249 Family history of ischemic heart disease and other diseases of the circulatory system: Secondary | ICD-10-CM | POA: Insufficient documentation

## 2022-06-22 DIAGNOSIS — E119 Type 2 diabetes mellitus without complications: Secondary | ICD-10-CM | POA: Diagnosis not present

## 2022-06-22 DIAGNOSIS — N6022 Fibroadenosis of left breast: Secondary | ICD-10-CM | POA: Insufficient documentation

## 2022-06-22 DIAGNOSIS — J45909 Unspecified asthma, uncomplicated: Secondary | ICD-10-CM | POA: Insufficient documentation

## 2022-06-22 DIAGNOSIS — Z79899 Other long term (current) drug therapy: Secondary | ICD-10-CM | POA: Insufficient documentation

## 2022-06-22 DIAGNOSIS — D649 Anemia, unspecified: Secondary | ICD-10-CM | POA: Diagnosis not present

## 2022-06-22 DIAGNOSIS — I10 Essential (primary) hypertension: Secondary | ICD-10-CM | POA: Insufficient documentation

## 2022-06-22 DIAGNOSIS — E1169 Type 2 diabetes mellitus with other specified complication: Secondary | ICD-10-CM

## 2022-06-22 DIAGNOSIS — Z01818 Encounter for other preprocedural examination: Secondary | ICD-10-CM

## 2022-06-22 HISTORY — PX: BREAST LUMPECTOMY WITH RADIOACTIVE SEED LOCALIZATION: SHX6424

## 2022-06-22 LAB — POCT PREGNANCY, URINE: Preg Test, Ur: NEGATIVE

## 2022-06-22 LAB — GLUCOSE, CAPILLARY: Glucose-Capillary: 79 mg/dL (ref 70–99)

## 2022-06-22 SURGERY — BREAST LUMPECTOMY WITH RADIOACTIVE SEED LOCALIZATION
Anesthesia: General | Site: Breast | Laterality: Left

## 2022-06-22 MED ORDER — CEFAZOLIN SODIUM-DEXTROSE 2-3 GM-%(50ML) IV SOLR
INTRAVENOUS | Status: DC | PRN
  Administered 2022-06-22: 2 g via INTRAVENOUS

## 2022-06-22 MED ORDER — ACETAMINOPHEN 500 MG PO TABS
1000.0000 mg | ORAL_TABLET | Freq: Once | ORAL | Status: AC
  Administered 2022-06-22: 1000 mg via ORAL

## 2022-06-22 MED ORDER — EPHEDRINE SULFATE (PRESSORS) 50 MG/ML IJ SOLN
INTRAMUSCULAR | Status: DC | PRN
  Administered 2022-06-22: 5 mg via INTRAVENOUS
  Administered 2022-06-22: 10 mg via INTRAVENOUS
  Administered 2022-06-22: 5 mg via INTRAVENOUS

## 2022-06-22 MED ORDER — DEXAMETHASONE SODIUM PHOSPHATE 10 MG/ML IJ SOLN
INTRAMUSCULAR | Status: AC
  Filled 2022-06-22: qty 1

## 2022-06-22 MED ORDER — LIDOCAINE 2% (20 MG/ML) 5 ML SYRINGE
INTRAMUSCULAR | Status: AC
  Filled 2022-06-22: qty 5

## 2022-06-22 MED ORDER — ACETAMINOPHEN 500 MG PO TABS
ORAL_TABLET | ORAL | Status: AC
  Filled 2022-06-22: qty 2

## 2022-06-22 MED ORDER — PROPOFOL 10 MG/ML IV BOLUS
INTRAVENOUS | Status: DC | PRN
  Administered 2022-06-22: 150 mg via INTRAVENOUS

## 2022-06-22 MED ORDER — MIDAZOLAM HCL 2 MG/2ML IJ SOLN
INTRAMUSCULAR | Status: AC
  Filled 2022-06-22: qty 2

## 2022-06-22 MED ORDER — CEFAZOLIN SODIUM-DEXTROSE 2-4 GM/100ML-% IV SOLN
INTRAVENOUS | Status: AC
  Filled 2022-06-22: qty 100

## 2022-06-22 MED ORDER — BUPIVACAINE-EPINEPHRINE (PF) 0.25% -1:200000 IJ SOLN: INTRAMUSCULAR | Status: DC | PRN

## 2022-06-22 MED ORDER — ONDANSETRON HCL 4 MG/2ML IJ SOLN
INTRAMUSCULAR | Status: AC
  Filled 2022-06-22: qty 2

## 2022-06-22 MED ORDER — CHLORHEXIDINE GLUCONATE CLOTH 2 % EX PADS: 6.0000 | MEDICATED_PAD | Freq: Once | CUTANEOUS | Status: DC

## 2022-06-22 MED ORDER — SODIUM CHLORIDE 0.9 % IV SOLN
INTRAVENOUS | Status: DC | PRN
  Administered 2022-06-22: 500 mL

## 2022-06-22 MED ORDER — FENTANYL CITRATE (PF) 100 MCG/2ML IJ SOLN
INTRAMUSCULAR | Status: AC
  Filled 2022-06-22: qty 2

## 2022-06-22 MED ORDER — MIDAZOLAM HCL 5 MG/5ML IJ SOLN
INTRAMUSCULAR | Status: DC | PRN
  Administered 2022-06-22: 2 mg via INTRAVENOUS

## 2022-06-22 MED ORDER — EPHEDRINE 5 MG/ML INJ
INTRAVENOUS | Status: AC
  Filled 2022-06-22: qty 5

## 2022-06-22 MED ORDER — ONDANSETRON HCL 4 MG/2ML IJ SOLN
INTRAMUSCULAR | Status: DC | PRN
  Administered 2022-06-22: 4 mg via INTRAVENOUS

## 2022-06-22 MED ORDER — FENTANYL CITRATE (PF) 100 MCG/2ML IJ SOLN
25.0000 ug | INTRAMUSCULAR | Status: DC | PRN
  Administered 2022-06-22: 50 ug via INTRAVENOUS

## 2022-06-22 MED ORDER — BUPIVACAINE HCL (PF) 0.25 % IJ SOLN
INTRAMUSCULAR | Status: DC | PRN
  Administered 2022-06-22: 20 mL

## 2022-06-22 MED ORDER — PROPOFOL 10 MG/ML IV BOLUS
INTRAVENOUS | Status: AC
  Filled 2022-06-22: qty 20

## 2022-06-22 MED ORDER — DEXAMETHASONE SODIUM PHOSPHATE 10 MG/ML IJ SOLN
INTRAMUSCULAR | Status: DC | PRN
  Administered 2022-06-22: 4 mg via INTRAVENOUS

## 2022-06-22 MED ORDER — LACTATED RINGERS IV SOLN: INTRAVENOUS | Status: DC

## 2022-06-22 MED ORDER — LIDOCAINE HCL (CARDIAC) PF 100 MG/5ML IV SOSY
PREFILLED_SYRINGE | INTRAVENOUS | Status: DC | PRN
  Administered 2022-06-22: 60 mg via INTRATRACHEAL

## 2022-06-22 MED ORDER — FENTANYL CITRATE (PF) 100 MCG/2ML IJ SOLN
INTRAMUSCULAR | Status: DC | PRN
  Administered 2022-06-22: 25 ug via INTRAVENOUS
  Administered 2022-06-22: 50 ug via INTRAVENOUS

## 2022-06-22 MED ORDER — CEFAZOLIN IN SODIUM CHLORIDE 3-0.9 GM/100ML-% IV SOLN: 3.0000 g | INTRAVENOUS | Status: DC

## 2022-06-22 SURGICAL SUPPLY — 51 items
ADH SKN CLS APL DERMABOND .7 (GAUZE/BANDAGES/DRESSINGS) ×1
APL PRP STRL LF DISP 70% ISPRP (MISCELLANEOUS) ×1
APPLIER CLIP 9.375 MED OPEN (MISCELLANEOUS)
APR CLP MED 9.3 20 MLT OPN (MISCELLANEOUS)
BINDER BREAST LRG (GAUZE/BANDAGES/DRESSINGS) IMPLANT
BINDER BREAST MEDIUM (GAUZE/BANDAGES/DRESSINGS) IMPLANT
BINDER BREAST XLRG (GAUZE/BANDAGES/DRESSINGS) IMPLANT
BINDER BREAST XXLRG (GAUZE/BANDAGES/DRESSINGS) IMPLANT
BLADE SURG 15 STRL LF DISP TIS (BLADE) ×1 IMPLANT
BLADE SURG 15 STRL SS (BLADE) ×1
CANISTER SUC SOCK COL 7IN (MISCELLANEOUS) IMPLANT
CANISTER SUCT 1200ML W/VALVE (MISCELLANEOUS) IMPLANT
CHLORAPREP W/TINT 26 (MISCELLANEOUS) ×1 IMPLANT
CLIP APPLIE 9.375 MED OPEN (MISCELLANEOUS) IMPLANT
COVER BACK TABLE 60X90IN (DRAPES) ×1 IMPLANT
COVER MAYO STAND STRL (DRAPES) ×1 IMPLANT
COVER PROBE CYLINDRICAL 5X96 (MISCELLANEOUS) ×1 IMPLANT
DERMABOND ADVANCED .7 DNX12 (GAUZE/BANDAGES/DRESSINGS) ×1 IMPLANT
DRAPE LAPAROSCOPIC ABDOMINAL (DRAPES) IMPLANT
DRAPE LAPAROTOMY 100X72 PEDS (DRAPES) ×1 IMPLANT
DRAPE UTILITY XL STRL (DRAPES) ×1 IMPLANT
ELECT COATED BLADE 2.86 ST (ELECTRODE) ×1 IMPLANT
ELECT REM PT RETURN 9FT ADLT (ELECTROSURGICAL) ×1
ELECTRODE REM PT RTRN 9FT ADLT (ELECTROSURGICAL) ×1 IMPLANT
GLOVE BIOGEL PI IND STRL 6.5 (GLOVE) IMPLANT
GLOVE BIOGEL PI IND STRL 8 (GLOVE) ×1 IMPLANT
GLOVE ECLIPSE 6.5 STRL STRAW (GLOVE) IMPLANT
GLOVE ECLIPSE 8.0 STRL XLNG CF (GLOVE) ×1 IMPLANT
GOWN STRL REUS W/ TWL LRG LVL3 (GOWN DISPOSABLE) ×2 IMPLANT
GOWN STRL REUS W/ TWL XL LVL3 (GOWN DISPOSABLE) ×1 IMPLANT
GOWN STRL REUS W/TWL LRG LVL3 (GOWN DISPOSABLE) ×1
GOWN STRL REUS W/TWL XL LVL3 (GOWN DISPOSABLE) ×1
HEMOSTAT ARISTA ABSORB 3G PWDR (HEMOSTASIS) IMPLANT
HEMOSTAT SNOW SURGICEL 2X4 (HEMOSTASIS) IMPLANT
KIT MARKER MARGIN INK (KITS) ×1 IMPLANT
NDL HYPO 25X1 1.5 SAFETY (NEEDLE) ×1 IMPLANT
NEEDLE HYPO 25X1 1.5 SAFETY (NEEDLE) ×1 IMPLANT
NS IRRIG 1000ML POUR BTL (IV SOLUTION) ×1 IMPLANT
PACK BASIN DAY SURGERY FS (CUSTOM PROCEDURE TRAY) ×1 IMPLANT
PENCIL SMOKE EVACUATOR (MISCELLANEOUS) ×1 IMPLANT
SLEEVE SCD COMPRESS KNEE MED (STOCKING) ×1 IMPLANT
SPIKE FLUID TRANSFER (MISCELLANEOUS) IMPLANT
SPONGE T-LAP 4X18 ~~LOC~~+RFID (SPONGE) ×1 IMPLANT
SUT MNCRL AB 4-0 PS2 18 (SUTURE) ×1 IMPLANT
SUT SILK 2 0 SH (SUTURE) IMPLANT
SUT VICRYL 3-0 CR8 SH (SUTURE) ×1 IMPLANT
SYR CONTROL 10ML LL (SYRINGE) ×1 IMPLANT
TOWEL GREEN STERILE FF (TOWEL DISPOSABLE) ×1 IMPLANT
TRAY FAXITRON CT DISP (TRAY / TRAY PROCEDURE) ×1 IMPLANT
TUBE CONNECTING 20X1/4 (TUBING) IMPLANT
YANKAUER SUCT BULB TIP NO VENT (SUCTIONS) IMPLANT

## 2022-06-22 NOTE — Op Note (Signed)
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  Clip was detected in the posterior margin that was excised.  All tissue oriented with ink and sent to pathology.  Films reviewed by the radiologist.  Drains: None  Indications for procedure: The patient is a 62 year old female with a history of left breast cancer and a chronic seroma who is developed an area of architectural abnormality and calcifications left breast upper outer quadrant near the tumor bed.  Core biopsy showed atypical ductal hyperplasia.  We recommended lumpectomy given her previous breast cancer history and to exclude recurrent malignancy.  Risks and benefits reviewed as well as long-term complications and outcomes.  We discussed poor wound healing with previously irradiated breast as well as the need for further surgery if this lesion is upgraded.  We discussed wound complications which she has had before the risk of bleeding, infection, cosmetic deformity, anesthesia risk, and potential complications of poor wound healing and drainage.  She agreed to proceed.  Description of procedure: The patient was met in the holding area and questions were answered.  She underwent seed placement as an outpatient.  Left breast was marked as the correct site.  All questions were answered and the films were reviewed.  She was taken back to the operating room.  She is placed supine upon the OR table.  After induction of general anesthesia, left breast was prepped and draped in sterile fashion and timeout performed.  Proper patient, site and procedure verified.  Neoprobe was used to identify the seed in the previous left breast lumpectomy scar in the upper outer quadrant.  A curvilinear incision was made through the  previous scar.  Dissection was carried down all tissue around the seed and clip were excised.  Imaging showed the seed to be present but not the clip.  The posterior margin was excised which had the clip in it.  All margins were oriented with ink and sent to pathology.  Hemostasis was achieved.  Irrigation used.  Local anesthetic infiltrated throughout and deep tissue planes closed with 3-0 Vicryl.  4 Monocryl was used to close the skin in a subcuticular fashion.  Dermabond applied.  All counts found to be correct.  Breast binder placed.  The patient was awoke extubated taken to recovery in satisfactory condition.

## 2022-06-22 NOTE — Interval H&P Note (Signed)
History and Physical Interval Note:  06/22/2022 9:45 AM  Katrina Beard  has presented today for surgery, with the diagnosis of * No pre-op diagnosis entered *.  The various methods of treatment have been discussed with the patient and family. After consideration of risks, benefits and other options for treatment, the patient has consented to  Procedure(s): LEFT BREAST LUMPECTOMY WITH RADIOACTIVE SEED LOCALIZATION (Left) as a surgical intervention.  The patient's history has been reviewed, patient examined, no change in status, stable for surgery.  I have reviewed the patient's chart and labs.  Questions were answered to the patient's satisfaction.   The procedure has been discussed with the patient. Alternatives to surgery have been discussed with the patient.  Risks of surgery include bleeding,  Infection,  Seroma formation, death,  and the need for further surgery.   The patient understands and wishes to proceed.   Berneta Sconyers A Mikayla Chiusano

## 2022-06-22 NOTE — Discharge Instructions (Signed)
[facility] Office Phone Number [phone]  BREAST BIOPSY/ PARTIAL MASTECTOMY: POST OP INSTRUCTIONS  Always review your discharge instruction sheet given to you by the facility where your surgery was performed.  IF YOU HAVE DISABILITY OR FAMILY LEAVE FORMS, YOU MUST BRING THEM TO THE OFFICE FOR PROCESSING.  DO NOT GIVE THEM TO YOUR DOCTOR.  A prescription for pain medication may be given to you upon discharge.  Take your pain medication as prescribed, if needed.  If narcotic pain medicine is not needed, then you may take acetaminophen (Tylenol) or ibuprofen (Advil) as needed. Take your usually prescribed medications unless otherwise directed If you need a refill on your pain medication, please contact your pharmacy.  They will contact our office to request authorization.  Prescriptions will not be filled after 5pm or on week-ends. You should eat very light the first 24 hours after surgery, such as soup, crackers, pudding, etc.  Resume your normal diet the day after surgery. Most patients will experience some swelling and bruising in the breast.  Ice packs and a good support bra will help.  Swelling and bruising can take several days to resolve.  It is common to experience some constipation if taking pain medication after surgery.  Increasing fluid intake and taking a stool softener will usually help or prevent this problem from occurring.  A mild laxative (Milk of Magnesia or Miralax) should be taken according to package directions if there are no bowel movements after 48 hours. Unless discharge instructions indicate otherwise, you may remove your bandages 24-48 hours after surgery, and you may shower at that time.  You may have steri-strips (small skin tapes) in place directly over the incision.  These strips should be left on the skin for 7-10 days.  If your surgeon used skin glue on the incision, you may shower in 24 hours.  The glue will flake off over the next 2-3 weeks.  Any  sutures or staples will be removed at the office during your follow-up visit. ACTIVITIES:  You may resume regular daily activities (gradually increasing) beginning the next day.  Wearing a good support bra or sports bra minimizes pain and swelling.  You may have sexual intercourse when it is comfortable. You may drive when you no longer are taking prescription pain medication, you can comfortably wear a seatbelt, and you can safely maneuver your car and apply brakes. RETURN TO WORK:  ______________________________________________________________________________________ You should see your doctor in the office for a follow-up appointment approximately two weeks after your surgery.  Your doctor's nurse will typically make your follow-up appointment when she calls you with your pathology report.  Expect your pathology report 2-3 business days after your surgery.  You may call to check if you do not hear from us after three days. OTHER INSTRUCTIONS: _______________________________________________________________________________________________ _____________________________________________________________________________________________________________________________________ _____________________________________________________________________________________________________________________________________ _____________________________________________________________________________________________________________________________________  WHEN TO CALL YOUR DOCTOR: Fever over 101.0 Nausea and/or vomiting. Extreme swelling or bruising. Continued bleeding from incision. Increased pain, redness, or drainage from the incision.  The clinic staff is available to answer your questions during regular business hours.  Please don't hesitate to call and ask to speak to one of the nurses for clinical concerns.  If you have a medical emergency, go to the nearest emergency room or call 911.  A surgeon from [facility] is always on call at the hospital.  For further questions, please visit [email or web address]   

## 2022-06-22 NOTE — H&P (Signed)
History of Present Illness: Katrina Beard is a 62 y.o. 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 This was complicated by a large seroma that is finally resolved with surgical management. She had a recent mammogram which showed a cluster of left breast microcalcifications. Core biopsy showed atypical ductal hyperplasia..    Review of Systems: A complete review of systems was obtained from the patient. I have reviewed this information and discussed as appropriate with the patient. See HPI as well for other ROS.    Medical History: Past Medical History:  Diagnosis Date  Arthritis  Asthma, unspecified asthma severity, unspecified whether complicated, unspecified whether persistent   There is no problem list on file for this patient.  Past Surgical History:  Procedure Laterality Date  LEFT BREAST LUMPECTOMY WITH RADIOACTIVE SEED LOCALIZATION X2 06/11/2021  Dr. Luisa Hart  GASTRIC BYPASS OPEN  kidney stones  left hand    Allergies  Allergen Reactions  Lisinopril Anaphylaxis and Angioedema  Bisoprolol Fumarate Other (See Comments)  Hctz/Reserpine/Hydralazine [Hydralazine-Reserpin-Hcthiazid] Other (See Comments)  Ibuprofen Other (See Comments)  Gastric bypass surgery-can't take anymore Gastric bypass surgery-can't take anymore  Liraglutide Other (See Comments)  Lisinopril-Hydrochlorothiazide Other (See Comments)  Arm numbness and tongue blisters Arm numbness and tongue blisters  Nsaids (Non-Steroidal Anti-Inflammatory Drug) Other (See Comments)   Current Outpatient Medications on File Prior to Visit  Medication Sig Dispense Refill  amLODIPine (NORVASC) 5 MG tablet Take 5 mg by mouth every morning  aspirin 81 MG EC tablet Take 81 mg by mouth once daily  atorvastatin (LIPITOR) 20 MG tablet Take 1 tablet by mouth once daily  ferrous sulfate 325 (65 FE) MG tablet Take 325 mg by mouth daily with breakfast  fluticasone propionate (FLONASE)  50 mcg/actuation nasal spray Place 2 sprays into both nostrils once daily  iron bisgly,ps-FA-B-C#12-succ 65 mg-65 mg -1,000 mcg (24) Tab Take 65 mg by mouth  naproxen sodium (ALEVE) 220 MG tablet Take 220 mg by mouth 2 (two) times daily with meals  tamoxifen (NOLVADEX) 20 MG tablet Take 20 mg by mouth once daily  valsartan (DIOVAN) 320 MG tablet 1 tablet   No current facility-administered medications on file prior to visit.   Family History  Problem Relation Age of Onset  High blood pressure (Hypertension) Mother  Hyperlipidemia (Elevated cholesterol) Mother  Breast cancer Mother  High blood pressure (Hypertension) Father  High blood pressure (Hypertension) Brother    Social History   Tobacco Use  Smoking Status Never  Smokeless Tobacco Never    Social History   Socioeconomic History  Marital status: Single  Tobacco Use  Smoking status: Never  Smokeless tobacco: Never  Substance and Sexual Activity  Alcohol use: Never  Drug use: Never   Objective:   There were no vitals filed for this visit.  There is no height or weight on file to calculate BMI.  Physical Exam HENT:  Head: Normocephalic.  Cardiovascular:  Rate and Rhythm: Normal rate.  Pulmonary:  Effort: Pulmonary effort is normal.  Chest:  Breasts: Right: Normal.  Left: No mass.   Comments: Postop changes left breast. Cosmetic deformity left breast. Postradiation changes noted. Musculoskeletal:  General: Normal range of motion.  Lymphadenopathy:  Upper Body:  Right upper body: No supraclavicular or axillary adenopathy.  Left upper body: No supraclavicular or axillary adenopathy.  Skin: General: Skin is warm.  Neurological:  General: No focal deficit present.  Mental Status: She is alert.  Psychiatric:  Mood and  Affect: Mood normal.     Labs, Imaging and Diagnostic Testing:  CLINICAL DATA: 62 year old female with history of left breast lumpectomy for DCIS, CSL with intraductal papilloma.  Margins were negative.  EXAM: DIGITAL DIAGNOSTIC BILATERAL MAMMOGRAM WITH TOMOSYNTHESIS  TECHNIQUE: Bilateral digital diagnostic mammography and breast tomosynthesis was performed.  COMPARISON: Previous exam(s).  ACR Breast Density Category b: There are scattered areas of fibroglandular density.  FINDINGS: In the lumpectomy site in the upper-outer quadrant of the left breast, there are calcifications in a linear orientation spanning in total approximately 3 cm. The calcifications along the anterior and posterior margin of these calcifications are quite coarse and heterogeneous consistent with dystrophic calcifications, however the calcifications in the midportion of the group are indeterminate. The span of the indeterminate calcifications is about 2 cm. No other suspicious calcifications, masses or areas of distortion are seen in the bilateral breasts.  IMPRESSION: 1. Indeterminate 2 cm group of linearly oriented calcifications at the patient's left breast lumpectomy site. While these may be related to fat necrosis/surgical changes, further evaluation with biopsy is warranted.  2. No evidence of right breast malignancy.  RECOMMENDATION: One site stereotactic biopsy of the left breast calcifications is recommended. This has been scheduled for 04/19/2022 at 11:30 a.m.  I have discussed the findings and recommendations with the patient. If applicable, a reminder letter will be sent to the patient regarding the next appointment.  BI-RADS CATEGORY 4: Suspicious. Assessment and Plan:   Diagnoses and all orders for this visit:  Atypical ductal hyperplasia of left breast    Recommend left breast seed localized lumpectomy. Also, this will help address the contour discrepancy in the left from previous large seroma. Given history of DCIS she agrees to proceed with left breast seed localized lumpectomy.  The procedure has been discussed with the patient. Alternatives to  surgery have been discussed with the patient. Risks of surgery include bleeding, Infection, Seroma formation, death, and the need for further surgery. The patient understands and wishes to proceed.   No follow-ups on file.  [name], MD

## 2022-06-22 NOTE — Anesthesia Postprocedure Evaluation (Signed)
Anesthesia Post Note  Patient: Katrina Beard  Procedure(s) Performed: LEFT BREAST LUMPECTOMY WITH RADIOACTIVE SEED LOCALIZATION (Left: Breast)     Patient location during evaluation: PACU Anesthesia Type: General Level of consciousness: awake and alert Pain management: pain level controlled Vital Signs Assessment: post-procedure vital signs reviewed and stable Respiratory status: spontaneous breathing, nonlabored ventilation and respiratory function stable Cardiovascular status: blood pressure returned to baseline and stable Postop Assessment: no apparent nausea or vomiting Anesthetic complications: no  No notable events documented.  Last Vitals:  Vitals:   06/22/22 1200 06/22/22 1206  BP: (!) 168/68 (!) 152/70  Pulse: 64   Resp: 16   Temp: 36.4 C   SpO2: 100%     Last Pain:  Vitals:   06/22/22 1200  TempSrc: Oral  PainSc: 0-No pain                 Anoushka Divito,W. EDMOND

## 2022-06-22 NOTE — Transfer of Care (Signed)
Immediate Anesthesia Transfer of Care Note  Patient: Katrina Beard  Procedure(s) Performed: LEFT BREAST LUMPECTOMY WITH RADIOACTIVE SEED LOCALIZATION (Left: Breast)  Patient Location: PACU  Anesthesia Type:General  Level of Consciousness: sedated  Airway & Oxygen Therapy: Patient Spontanous Breathing and Patient connected to face mask oxygen  Post-op Assessment: Report given to RN and Post -op Vital signs reviewed and stable  Post vital signs: Reviewed and stable  Last Vitals:  Vitals Value Taken Time  BP    Temp    Pulse 93 06/22/22 1105  Resp 14 06/22/22 1105  SpO2 100 % 06/22/22 1105  Vitals shown include unvalidated device data.  Last Pain:  Vitals:   06/22/22 0912  TempSrc: Oral  PainSc: 0-No pain         Complications: No notable events documented.

## 2022-06-22 NOTE — Progress Notes (Signed)
Please excuse Katrina Beard from work today since she brought a family member to surgery and has to be with them for 24 hours.  Thank you Harriette Bouillon MD

## 2022-06-22 NOTE — Anesthesia Preprocedure Evaluation (Addendum)
Anesthesia Evaluation  Patient identified by MRN, date of birth, ID band Patient awake    Reviewed: Allergy & Precautions, H&P , NPO status , Patient's Chart, lab work & pertinent test results  Airway Mallampati: II  TM Distance: >3 FB Neck ROM: Full    Dental no notable dental hx. (+) Teeth Intact, Dental Advisory Given   Pulmonary asthma    Pulmonary exam normal breath sounds clear to auscultation       Cardiovascular hypertension, Pt. on medications + CAD   Rhythm:Regular Rate:Normal     Neuro/Psych   Anxiety Depression    negative neurological ROS     GI/Hepatic Neg liver ROS,GERD  Medicated,,  Endo/Other  diabetes, Type 2, Oral Hypoglycemic Agents  Morbid obesity  Renal/GU negative Renal ROS  negative genitourinary   Musculoskeletal   Abdominal   Peds  Hematology  (+) Blood dyscrasia, anemia   Anesthesia Other Findings   Reproductive/Obstetrics negative OB ROS                             Anesthesia Physical Anesthesia Plan  ASA: 3  Anesthesia Plan: General   Post-op Pain Management: Tylenol PO (pre-op)*   Induction: Intravenous  PONV Risk Score and Plan: 4 or greater and Ondansetron, Dexamethasone and Midazolam  Airway Management Planned: LMA  Additional Equipment:   Intra-op Plan:   Post-operative Plan: Extubation in OR  Informed Consent: I have reviewed the patients History and Physical, chart, labs and discussed the procedure including the risks, benefits and alternatives for the proposed anesthesia with the patient or authorized representative who has indicated his/her understanding and acceptance.     Dental advisory given  Plan Discussed with: CRNA  Anesthesia Plan Comments:        Anesthesia Quick Evaluation

## 2022-06-22 NOTE — Anesthesia Procedure Notes (Signed)
Procedure Name: LMA Insertion Date/Time: 06/22/2022 10:10 AM  Performed by: Thornell Mule, CRNAPre-anesthesia Checklist: Patient identified, Emergency Drugs available, Suction available and Patient being monitored Patient Re-evaluated:Patient Re-evaluated prior to induction Oxygen Delivery Method: Circle system utilized Preoxygenation: Pre-oxygenation with 100% oxygen Induction Type: IV induction LMA: LMA inserted LMA Size: 4.0 Number of attempts: 1 Placement Confirmation: positive ETCO2 Tube secured with: Tape Dental Injury: Teeth and Oropharynx as per pre-operative assessment

## 2022-06-23 ENCOUNTER — Encounter (HOSPITAL_BASED_OUTPATIENT_CLINIC_OR_DEPARTMENT_OTHER): Payer: Self-pay | Admitting: Surgery

## 2022-06-24 ENCOUNTER — Ambulatory Visit (INDEPENDENT_AMBULATORY_CARE_PROVIDER_SITE_OTHER): Payer: BC Managed Care – PPO | Admitting: Adult Health

## 2022-06-24 ENCOUNTER — Encounter (INDEPENDENT_AMBULATORY_CARE_PROVIDER_SITE_OTHER): Payer: Self-pay | Admitting: Adult Health

## 2022-06-24 VITALS — BP 125/84 | HR 67 | Temp 98.2°F | Ht 62.0 in | Wt 194.0 lb

## 2022-06-24 DIAGNOSIS — E1159 Type 2 diabetes mellitus with other circulatory complications: Secondary | ICD-10-CM

## 2022-06-24 DIAGNOSIS — E559 Vitamin D deficiency, unspecified: Secondary | ICD-10-CM

## 2022-06-24 DIAGNOSIS — E1169 Type 2 diabetes mellitus with other specified complication: Secondary | ICD-10-CM

## 2022-06-24 DIAGNOSIS — Z6835 Body mass index (BMI) 35.0-35.9, adult: Secondary | ICD-10-CM

## 2022-06-24 DIAGNOSIS — E669 Obesity, unspecified: Secondary | ICD-10-CM

## 2022-06-24 DIAGNOSIS — I152 Hypertension secondary to endocrine disorders: Secondary | ICD-10-CM

## 2022-06-24 DIAGNOSIS — Z7985 Long-term (current) use of injectable non-insulin antidiabetic drugs: Secondary | ICD-10-CM

## 2022-06-24 LAB — SURGICAL PATHOLOGY

## 2022-06-24 MED ORDER — VITAMIN D (ERGOCALCIFEROL) 1.25 MG (50000 UNIT) PO CAPS
50000.0000 [IU] | ORAL_CAPSULE | ORAL | 0 refills | Status: DC
Start: 2022-06-24 — End: 2022-07-22

## 2022-06-24 MED ORDER — AMLODIPINE BESYLATE 10 MG PO TABS
ORAL_TABLET | ORAL | 0 refills | Status: DC
Start: 2022-06-24 — End: 2022-06-28

## 2022-06-24 MED ORDER — TIRZEPATIDE 7.5 MG/0.5ML ~~LOC~~ SOAJ
7.5000 mg | SUBCUTANEOUS | 0 refills | Status: DC
Start: 2022-06-24 — End: 2022-07-22
  Filled 2022-07-08: qty 2, 28d supply, fill #0

## 2022-06-24 NOTE — Progress Notes (Signed)
WEIGHT SUMMARY AND BIOMETRICS  Vitals Temp: 98.2 F (36.8 C) BP: 125/84 Pulse Rate: 67 SpO2: 97 %   Anthropometric Measurements Height: 5\' 2"  (1.575 m) Weight: 194 lb (88 kg) BMI (Calculated): 35.47 Weight at Last Visit: 189lb Weight Lost Since Last Visit: 0 Weight Gained Since Last Visit: 5lb Starting Weight: 231lb Total Weight Loss (lbs): 37 lb (16.8 kg)   Body Composition  Body Fat %: 46.6 % Fat Mass (lbs): 90.4 lbs Muscle Mass (lbs): 98.6 lbs Total Body Water (lbs): 76 lbs Visceral Fat Rating : 13   Other Clinical Data Fasting: No Labs: No Today's Visit #: 48 Starting Date: 04/03/19    Chief Complaint:   OBESITY Katrina Beard is here to discuss her progress with her obesity treatment plan. She is on the practicing portion control and making smarter food choices, such as increasing vegetables and decreasing simple carbohydrates and states she is following her eating plan approximately 50 % of the time.  She states she is not currently exercising.   Interim History:  05/27/2022 Converted from [place] 2.4 to [facility] 7.5mg   She is frustrated that her net weight has increased by 5 lbs since last OV.  Reviewed Bioimpedance results: Muscle Mass + 2.2 lbs Adipose Mass + 2.6 lbs Water Weight - 3.2 lbs  06/22/2022 LEFT BREAST LUMPECTOMY WITH RADIOACTIVE SEED LOCALIZATION X2 06/11/2021   Subjective:   1. Vitamin D deficiency  Latest Reference Range & Units 03/25/22 10:42  Vitamin D, 25-Hydroxy 30.0 - 100.0 ng/mL 39.3  She is on weekly Ergocalciferol-denies N/V/Muscle Weakness  2. Type 2 diabetes mellitus with obesity 05/27/2022 Converted from [place] 2.4 to [facility] 7.5mg   She has had 3 doses of Mounjaro 7.5mg - Denies mass in neck, dysphagia, dyspepsia, persistent hoarseness, abdominal pain, or N/V/C   3. Hypertension associated with type 2 diabetes mellitus BP excellent at OV She is on: aspirin EC 81 MG tablet  atorvastatin (LIPITOR) 20 MG tablet   valsartan (DIOVAN) 320 MG tablet  tirzepatide (MOUNJARO) 7.5 MG/0.5ML Pen  amLODipine (NORVASC) 10 MG table     Assessment/Plan:   1. Vitamin D deficiency Refill - Vitamin D, Ergocalciferol, (DRISDOL) 1.25 MG (50000 UNIT) CAPS capsule; Take 1 capsule (50,000 Units total) by mouth every 7 (seven) days.  Dispense: 4 capsule; Refill: 0  2. Type 2 diabetes mellitus with obesity Refill - tirzepatide (MOUNJARO) 7.5 MG/0.5ML Pen; Inject 7.5 mg into the skin once a week.  Dispense: 2 mL; Refill: 0  3. Hypertension associated with type 2 diabetes mellitus Refill - amLODipine (NORVASC) 10 MG tablet; LAST REFILL FROM [facility]- [facility]EX FROM PCP/DR. WOLTERS  Dispense: 30 tablet; Refill: 0  4. Current BMI 35.47  Katrina Beard is currently in the action stage of change. As such, her goal is to continue with weight loss efforts. She has agreed to the Category 2 Plan.   Exercise goals: All adults should avoid inactivity. Some physical activity is better than none, and adults who participate in any amount of physical activity gain some health benefits.  Behavioral modification strategies: increasing lean protein intake, decreasing simple carbohydrates, increasing vegetables, increasing water intake, no skipping meals, meal planning and cooking strategies, better snacking choices, and planning for success.  Katrina Beard has agreed to follow-up with our clinic in 4 weeks. She was informed of the importance of frequent follow-up visits to maximize her success with intensive lifestyle modifications for her multiple health conditions.   Objective:   Blood pressure 125/84, pulse 67, temperature 98.2 F (36.8 C), height  5\' 2"  (1.575 m), weight 194 lb (88 kg), last menstrual period 11/09/2021, SpO2 97 %. Body mass index is 35.48 kg/m.  General: Cooperative, alert, well developed, in no acute distress. HEENT: Conjunctivae and lids unremarkable. Cardiovascular: Regular rhythm.  Lungs: Normal work of breathing. Neurologic: No  focal deficits.   Lab Results  Component Value Date   CREATININE 1.06 (H) 06/16/2022   BUN 15 06/16/2022   NA 143 06/16/2022   K 3.7 06/16/2022   CL 105 06/16/2022   CO2 26 06/16/2022   Lab Results  Component Value Date   ALT 35 (H) 03/25/2022   AST 24 03/25/2022   ALKPHOS 88 03/25/2022   BILITOT 0.5 03/25/2022   Lab Results  Component Value Date   HGBA1C 5.7 (H) 03/25/2022   HGBA1C 5.2 10/01/2021   HGBA1C 5.5 04/15/2021   HGBA1C 5.5 11/13/2020   HGBA1C 6.0 (H) 06/24/2020   Lab Results  Component Value Date   INSULIN 5.2 03/25/2022   INSULIN 6.1 10/01/2021   INSULIN 11.2 04/03/2019   Lab Results  Component Value Date   TSH 0.575 04/03/2019   Lab Results  Component Value Date   CHOL 161 04/15/2021   HDL 55 04/15/2021   LDLCALC 91 04/15/2021   TRIG 80 04/15/2021   Lab Results  Component Value Date   VD25OH 39.3 03/25/2022   VD25OH 49.1 10/01/2021   VD25OH 48.4 11/13/2020   Lab Results  Component Value Date   WBC 6.5 04/15/2021   HGB 13.0 04/15/2021   HCT 39 04/15/2021   MCV 86 06/24/2020   PLT 279 04/15/2021   Lab Results  Component Value Date   IRON 162 04/15/2021   TIBC 252 06/24/2020   FERRITIN 64 06/24/2020   Attestation Statements:   Reviewed by clinician on day of visit: allergies, medications, problem list, medical history, surgical history, family history, social history, and previous encounter notes.  I have reviewed the above documentation for accuracy and completeness, and I agree with the above. -  Neko Mcgeehan d. Jahir Halt, NP-C

## 2022-06-28 ENCOUNTER — Other Ambulatory Visit (INDEPENDENT_AMBULATORY_CARE_PROVIDER_SITE_OTHER): Payer: Self-pay | Admitting: Adult Health

## 2022-06-28 DIAGNOSIS — E1159 Type 2 diabetes mellitus with other circulatory complications: Secondary | ICD-10-CM

## 2022-06-28 MED ORDER — AMLODIPINE BESYLATE 10 MG PO TABS: ORAL_TABLET | ORAL | 0 refills | Status: DC

## 2022-06-29 ENCOUNTER — Other Ambulatory Visit: Payer: Self-pay | Admitting: Hematology and Oncology

## 2022-07-08 ENCOUNTER — Telehealth (INDEPENDENT_AMBULATORY_CARE_PROVIDER_SITE_OTHER): Payer: Self-pay

## 2022-07-08 ENCOUNTER — Telehealth (INDEPENDENT_AMBULATORY_CARE_PROVIDER_SITE_OTHER): Payer: Self-pay | Admitting: Adult Health

## 2022-07-08 ENCOUNTER — Encounter (INDEPENDENT_AMBULATORY_CARE_PROVIDER_SITE_OTHER): Payer: Self-pay

## 2022-07-08 ENCOUNTER — Other Ambulatory Visit (HOSPITAL_COMMUNITY): Payer: Self-pay

## 2022-07-08 NOTE — Telephone Encounter (Signed)
Patient called stating that [facility] at [facility] has [facility] 2.5mg  and . Patient wants to know what she should do. Please call patient at the number of file. Thank You!

## 2022-07-08 NOTE — Telephone Encounter (Signed)
Pt called 07/08/22 [facility] out stock.of Mounjaro missed yesterday shot. Wants to know where can she get her Mounjaro from because she has another shot do soon

## 2022-07-08 NOTE — Telephone Encounter (Signed)
Patient called stating she could not get her Mounjaro at the pharmacy we called it into, because they were out, asked what she should do. I instructed her to call other pharmacies to see if she could find it and if she did they could transfer it to that pharmacy. Pt called again to say she found it and asked again what she should do, I gave her the same instructions a second time.

## 2022-07-22 ENCOUNTER — Other Ambulatory Visit (HOSPITAL_COMMUNITY): Payer: Self-pay

## 2022-07-22 ENCOUNTER — Ambulatory Visit (INDEPENDENT_AMBULATORY_CARE_PROVIDER_SITE_OTHER): Payer: BC Managed Care – PPO | Admitting: Adult Health

## 2022-07-22 ENCOUNTER — Encounter (INDEPENDENT_AMBULATORY_CARE_PROVIDER_SITE_OTHER): Payer: Self-pay | Admitting: Adult Health

## 2022-07-22 VITALS — BP 133/81 | HR 67 | Temp 98.1°F | Ht 62.0 in | Wt 196.0 lb

## 2022-07-22 DIAGNOSIS — Z7985 Long-term (current) use of injectable non-insulin antidiabetic drugs: Secondary | ICD-10-CM

## 2022-07-22 DIAGNOSIS — E1169 Type 2 diabetes mellitus with other specified complication: Secondary | ICD-10-CM

## 2022-07-22 DIAGNOSIS — Z6835 Body mass index (BMI) 35.0-35.9, adult: Secondary | ICD-10-CM

## 2022-07-22 DIAGNOSIS — E559 Vitamin D deficiency, unspecified: Secondary | ICD-10-CM

## 2022-07-22 DIAGNOSIS — F3289 Other specified depressive episodes: Secondary | ICD-10-CM

## 2022-07-22 DIAGNOSIS — E669 Obesity, unspecified: Secondary | ICD-10-CM | POA: Diagnosis not present

## 2022-07-22 MED ORDER — VITAMIN D (ERGOCALCIFEROL) 1.25 MG (50000 UNIT) PO CAPS
50000.0000 [IU] | ORAL_CAPSULE | ORAL | 0 refills | Status: DC
Start: 2022-07-22 — End: 2022-08-24
  Filled 2022-07-22: qty 4, 28d supply, fill #0

## 2022-07-22 MED ORDER — TIRZEPATIDE 10 MG/0.5ML ~~LOC~~ SOAJ
10.0000 mg | SUBCUTANEOUS | 0 refills | Status: DC
  Filled 2022-07-22: qty 2, 28d supply, fill #0

## 2022-07-22 NOTE — Progress Notes (Signed)
WEIGHT SUMMARY AND BIOMETRICS  Vitals Temp: 98.1 F (36.7 C) BP: 133/81 Pulse Rate: 67 SpO2: 100 %   Anthropometric Measurements Height: 5\' 2"  (1.575 m) Weight: 196 lb (88.9 kg) BMI (Calculated): 35.84 Weight at Last Visit: 194lb Weight Lost Since Last Visit: 0 Weight Gained Since Last Visit: 2lb Starting Weight: 231lb Total Weight Loss (lbs): 35 lb (15.9 kg)   Body Composition  Body Fat %: 47.6 % Fat Mass (lbs): 93.6 lbs Muscle Mass (lbs): 97.8 lbs Total Body Water (lbs): 80 lbs Visceral Fat Rating : 14   Other Clinical Data Fasting: no Labs: no Today's Visit #: 36 Starting Date: 04/03/19    Chief Complaint:   OBESITY Katrina Beard is here to discuss her progress with her obesity treatment plan. She is on the practicing portion control and making smarter food choices, such as increasing vegetables and decreasing simple carbohydrates and states she is following her eating plan approximately 60 % of the time. She states she is not currently exercising due to acute pain of Left knee and R ankle.   Interim History:  Hunger/appetite-increased cravings despite Mounjaro 7.5mg  once weeky Cravings- increased cravings despite Mounjaro 7.5mg  once weeky Stress- current foster daughter continues to cause stress in home, she hopes to have her re-homed June 2024 Sleep- estimated 4 hours/night due to frequent awakings Exercise-none due to L knee pain and R ankle pain Hydration-2 bottles 16.9 oz water bottle per day  Reviewed [facility] Results with pt: Muscle Mass: -0.8 lb Adipose Mass: + 3.2 lbs  Subjective:   1. Vitamin D deficiency  Latest Reference Range & Units 10/01/21 11:48 03/25/22 10:42  Vitamin D, 25-Hydroxy 30.0 - 100.0 ng/mL 49.1 39.3  She is on weekly Ergocalciferol- denies N/V/Muscle Weakness  2. Type 2 diabetes mellitus with obesity (HCC) Lab Results  Component Value Date   HGBA1C 5.7 (H) 03/25/2022   HGBA1C 5.2 10/01/2021   HGBA1C 5.5 04/15/2021      Latest Reference Range & Units 03/25/22 10:42  Glucose 70 - 99 mg/dL 78  Hemoglobin Z6X 4.8 - 5.6 % 5.7 (H)  Est. average glucose Bld gHb Est-mCnc mg/dL 096  INSULIN 2.6 - 04.5 uIU/mL 5.2  (H): Data is abnormally high  05/27/2022 Wegovy 2.4 replaced with Mounjaro 7.5mg  once weekly injection She endorses polyphagia Denies mass in neck, dysphagia, dyspepsia, persistent hoarseness, abdominal pain, or N/V/C   3. Other depression, with emotional eating 09/01/2021 [facility] Note- Oncology advises to stop bupropion SR 200 mg once daily due to interaction with tamoxifen and decreases the effectiveness of tamoxifen.   She has been off bupropion since 08/30/2021.  Assessment/Plan:   1. Vitamin D deficiency Refill - Vitamin D, Ergocalciferol, (DRISDOL) 1.25 MG (50000 UNIT) CAPS capsule; Take 1 capsule (50,000 Units total) by mouth every 7 (seven) days.  Dispense: 4 capsule; Refill: 0  2. Type 2 diabetes mellitus with obesity (HCC) Refill and increase tirzepatide (MOUNJARO) 10 MG/0.5ML Pen Inject 10 mg into the skin once a week. Dispense: 6 mL, Refills: 0 of 0 remaining   3. Other depression, with emotional eating REMAIN OFF BUPROPION THERAPY  4. Current BMI 35.84  [name] is not currently in the action stage of change. As such, her goal is to get back to weightloss efforts . She has agreed to following a lower carbohydrate, vegetable and lean protein rich diet plan.   Exercise goals: All adults should avoid inactivity. Some physical activity is better than none, and adults who participate in any amount of  physical activity gain some health benefits.  Behavioral modification strategies: increasing lean protein intake, decreasing simple carbohydrates, increasing vegetables, increasing water intake, no skipping meals, meal planning and cooking strategies, and planning for success.  Katrina Beard has agreed to follow-up with our clinic in 4 weeks. She was informed of the importance of frequent follow-up  visits to maximize her success with intensive lifestyle modifications for her multiple health conditions.   Objective:   Blood pressure 133/81, pulse 67, temperature 98.1 F (36.7 C), height 5\' 2"  (1.575 m), weight 196 lb (88.9 kg), SpO2 100 %. Body mass index is 35.85 kg/m.  General: Cooperative, alert, well developed, in no acute distress. HEENT: Conjunctivae and lids unremarkable. Cardiovascular: Regular rhythm.  Lungs: Normal work of breathing. Neurologic: No focal deficits.   Lab Results  Component Value Date   CREATININE 1.06 (H) 06/16/2022   BUN 15 06/16/2022   NA 143 06/16/2022   K 3.7 06/16/2022   CL 105 06/16/2022   CO2 26 06/16/2022   Lab Results  Component Value Date   ALT 35 (H) 03/25/2022   AST 24 03/25/2022   ALKPHOS 88 03/25/2022   BILITOT 0.5 03/25/2022   Lab Results  Component Value Date   HGBA1C 5.7 (H) 03/25/2022   HGBA1C 5.2 10/01/2021   HGBA1C 5.5 04/15/2021   HGBA1C 5.5 11/13/2020   HGBA1C 6.0 (H) 06/24/2020   Lab Results  Component Value Date   INSULIN 5.2 03/25/2022   INSULIN 6.1 10/01/2021   INSULIN 11.2 04/03/2019   Lab Results  Component Value Date   TSH 0.575 04/03/2019   Lab Results  Component Value Date   CHOL 161 04/15/2021   HDL 55 04/15/2021   LDLCALC 91 04/15/2021   TRIG 80 04/15/2021   Lab Results  Component Value Date   VD25OH 39.3 03/25/2022   VD25OH 49.1 10/01/2021   VD25OH 48.4 11/13/2020   Lab Results  Component Value Date   WBC 6.5 04/15/2021   HGB 13.0 04/15/2021   HCT 39 04/15/2021   MCV 86 06/24/2020   PLT 279 04/15/2021   Lab Results  Component Value Date   IRON 162 04/15/2021   TIBC 252 06/24/2020   FERRITIN 64 06/24/2020   Attestation Statements:   Reviewed by clinician on day of visit: allergies, medications, problem list, medical history, surgical history, family history, social history, and previous encounter notes.  I have reviewed the above documentation for accuracy and completeness,  and I agree with the above. -  Sakshi Sermons d. Janace Decker, NP-C

## 2022-07-26 ENCOUNTER — Other Ambulatory Visit: Payer: Self-pay | Admitting: Hematology and Oncology

## 2022-08-24 ENCOUNTER — Encounter (INDEPENDENT_AMBULATORY_CARE_PROVIDER_SITE_OTHER): Payer: Self-pay | Admitting: Adult Health

## 2022-08-24 ENCOUNTER — Other Ambulatory Visit (HOSPITAL_COMMUNITY): Payer: Self-pay

## 2022-08-24 ENCOUNTER — Ambulatory Visit (INDEPENDENT_AMBULATORY_CARE_PROVIDER_SITE_OTHER): Payer: BC Managed Care – PPO | Admitting: Adult Health

## 2022-08-24 VITALS — BP 153/90 | HR 74 | Temp 97.9°F | Ht 62.0 in | Wt 195.0 lb

## 2022-08-24 DIAGNOSIS — E1169 Type 2 diabetes mellitus with other specified complication: Secondary | ICD-10-CM | POA: Diagnosis not present

## 2022-08-24 DIAGNOSIS — I152 Hypertension secondary to endocrine disorders: Secondary | ICD-10-CM

## 2022-08-24 DIAGNOSIS — E559 Vitamin D deficiency, unspecified: Secondary | ICD-10-CM

## 2022-08-24 DIAGNOSIS — Z7985 Long-term (current) use of injectable non-insulin antidiabetic drugs: Secondary | ICD-10-CM

## 2022-08-24 DIAGNOSIS — Z6835 Body mass index (BMI) 35.0-35.9, adult: Secondary | ICD-10-CM

## 2022-08-24 DIAGNOSIS — E1159 Type 2 diabetes mellitus with other circulatory complications: Secondary | ICD-10-CM | POA: Diagnosis not present

## 2022-08-24 DIAGNOSIS — E669 Obesity, unspecified: Secondary | ICD-10-CM

## 2022-08-24 MED ORDER — VITAMIN D (ERGOCALCIFEROL) 1.25 MG (50000 UNIT) PO CAPS
50000.0000 [IU] | ORAL_CAPSULE | ORAL | 0 refills | Status: DC
Start: 2022-08-24 — End: 2022-09-30
  Filled 2022-08-24: qty 4, 28d supply, fill #0

## 2022-08-24 MED ORDER — TIRZEPATIDE 10 MG/0.5ML ~~LOC~~ SOAJ
10.0000 mg | SUBCUTANEOUS | 0 refills | Status: DC
  Filled 2022-08-24: qty 2, 28d supply, fill #0

## 2022-08-24 NOTE — Progress Notes (Signed)
WEIGHT SUMMARY AND BIOMETRICS  Vitals Temp: 97.9 F (36.6 C) BP: (!) 153/90 Pulse Rate: 74 SpO2: 100 %   Anthropometric Measurements Height: 5\' 2"  (1.575 m) Weight: 195 lb (88.5 kg) BMI (Calculated): 35.66 Weight at Last Visit: 196 lb Weight Lost Since Last Visit: 1 lb Weight Gained Since Last Visit: 0 Starting Weight: 231 lb Total Weight Loss (lbs): 36 lb (16.3 kg)   Body Composition  Body Fat %: 43.2 % Fat Mass (lbs): 84.4 lbs Muscle Mass (lbs): 105.6 lbs Total Body Water (lbs): 83.2 lbs Visceral Fat Rating : 13   Other Clinical Data Fasting: no Labs: no Today's Visit #: 50 Starting Date: 04/03/19    Chief Complaint:   OBESITY Katrina Beard is here to discuss her progress with her obesity treatment plan. She is on the practicing portion control and making smarter food choices, such as increasing vegetables and decreasing simple carbohydrates and states she is following her eating plan approximately 30 % of the time. She states she is not currently exercising.   Interim History:  Ms. Ingman celebrated her 24th birthday for a week and even travelled [address] with her boyfriend. She reports decrease cravings/polyphagia after increase in Mounjaro from 7.5mg  to 10mg - Denies mass in neck, dysphagia, dyspepsia, persistent hoarseness, abdominal pain, or N/V/C  She has had 4 doses of increase GIP/GLP-1 therapy  Subjective:   1. T2D Lab Results  Component Value Date   HGBA1C 5.7 (H) 03/25/2022   HGBA1C 5.2 10/01/2021   HGBA1C 5.5 04/15/2021   05/27/2022 Wegovy 2.4 replaced with Mounjaro 7.5mg  once weekly injection  07/22/2022 Mounjaro 7.5mg  increase to 10mg  due to worsening polyphagia She has not been checking CBG at home She denies sx's hypoglycemia  2. Hypertension associated with type 2 diabetes mellitus (HCC) BP above goal She denies acute cardiac sx's She is on: aspirin EC 81 MG tablet  atorvastatin (LIPITOR) 20 MG tablet  valsartan (DIOVAN) 320 MG  tablet  amLODipine (NORVASC) 10 MG tablet  tirzepatide (MOUNJARO) 10 MG/0.5ML Pen    3. Vitamin D deficiency  Latest Reference Range & Units 10/01/21 11:48 03/25/22 10:42  Vitamin D, 25-Hydroxy 30.0 - 100.0 ng/mL 49.1 39.3  She is taking weekly Ergocalciferol- denies N/V/Muscle Weakness  Assessment/Plan:   T2D Refill  tirzepatide (MOUNJARO) 10 MG/0.5ML Pen Inject 10 mg into the skin once a week. Dispense: 6 mL, Refills: 0 of 0 remaining   1. Hypertension associated with type 2 diabetes mellitus (HCC) Consistently take antihypertensive therapy Reduce stress Increase daily walking  2. Vitamin D deficiency Refill - Vitamin D, Ergocalciferol, (DRISDOL) 1.25 MG (50000 UNIT) CAPS capsule; Take 1 capsule (50,000 Units total) by mouth every 7 (seven) days.  Dispense: 4 capsule; Refill: 0  3. Current BMI 35.8  Shuntavia is currently in the action stage of change. As such, her goal is to continue with weight loss efforts. She has agreed to the Category 2 Plan.   Exercise goals: All adults should avoid inactivity. Some physical activity is better than none, and adults who participate in any amount of physical activity gain some health benefits. Adults should also include muscle-strengthening activities that involve all major muscle groups on 2 or more days a week.  Behavioral modification strategies: increasing lean protein intake, decreasing simple carbohydrates, increasing vegetables, increasing water intake, no skipping meals, meal planning and cooking strategies, better snacking choices, and planning for success.  Marvelene has agreed to follow-up with our clinic in 4 weeks. She was informed of the  importance of frequent follow-up visits to maximize her success with intensive lifestyle modifications for her multiple health conditions.   Check Fasting Labs at next OV  Objective:   Blood pressure (!) 153/90, pulse 74, temperature 97.9 F (36.6 C), height 5\' 2"  (1.575 m), weight 195 lb (88.5  kg), SpO2 100 %. Body mass index is 35.67 kg/m.  General: Cooperative, alert, well developed, in no acute distress. HEENT: Conjunctivae and lids unremarkable. Cardiovascular: Regular rhythm.  Lungs: Normal work of breathing. Neurologic: No focal deficits.   Lab Results  Component Value Date   CREATININE 1.06 (H) 06/16/2022   BUN 15 06/16/2022   NA 143 06/16/2022   K 3.7 06/16/2022   CL 105 06/16/2022   CO2 26 06/16/2022   Lab Results  Component Value Date   ALT 35 (H) 03/25/2022   AST 24 03/25/2022   ALKPHOS 88 03/25/2022   BILITOT 0.5 03/25/2022   Lab Results  Component Value Date   HGBA1C 5.7 (H) 03/25/2022   HGBA1C 5.2 10/01/2021   HGBA1C 5.5 04/15/2021   HGBA1C 5.5 11/13/2020   HGBA1C 6.0 (H) 06/24/2020   Lab Results  Component Value Date   INSULIN 5.2 03/25/2022   INSULIN 6.1 10/01/2021   INSULIN 11.2 04/03/2019   Lab Results  Component Value Date   TSH 0.575 04/03/2019   Lab Results  Component Value Date   CHOL 161 04/15/2021   HDL 55 04/15/2021   LDLCALC 91 04/15/2021   TRIG 80 04/15/2021   Lab Results  Component Value Date   VD25OH 39.3 03/25/2022   VD25OH 49.1 10/01/2021   VD25OH 48.4 11/13/2020   Lab Results  Component Value Date   WBC 6.5 04/15/2021   HGB 13.0 04/15/2021   HCT 39 04/15/2021   MCV 86 06/24/2020   PLT 279 04/15/2021   Lab Results  Component Value Date   IRON 162 04/15/2021   TIBC 252 06/24/2020   FERRITIN 64 06/24/2020   Attestation Statements:   Reviewed by clinician on day of visit: allergies, medications, problem list, medical history, surgical history, family history, social history, and previous encounter notes.  I have reviewed the above documentation for accuracy and completeness, and I agree with the above. -  Chawn Spraggins d. Violet Seabury, NP-C

## 2022-08-25 ENCOUNTER — Other Ambulatory Visit (HOSPITAL_COMMUNITY): Payer: Self-pay

## 2022-09-04 ENCOUNTER — Other Ambulatory Visit: Payer: Self-pay | Admitting: Hematology and Oncology

## 2022-09-29 NOTE — Progress Notes (Signed)
TeleHealth Visit:  This visit was completed with telemedicine (audio/video) technology. Katrina Beard has verbally consented to this TeleHealth visit. The patient is located at home, the provider is located at home. The participants in this visit include the listed provider and patient. The visit was conducted today via MyChart video.  OBESITY Katrina Beard is here to discuss her progress with her obesity treatment plan along with follow-up of her obesity related diagnoses.   Today's visit was # 51 Starting weight: 231 lbs Starting date: 04/03/19 Weight at last in office visit: 195 lbs on 08/24/22 Total weight loss: 36 lbs at last in office visit on 08/24/22. Today's reported weight (09/30/22): none reported  Nutrition Plan: the Category 2 plan - 0% adherence.  Current exercise:  walking 30 minutes twice weekly.   Interim History:  She has not been following cat 2 but she is trying to avoid fried foods and carbs such as potatoes, rice, pasta. Gained 5 lbs between March and April and has not lost that weight. She was going through some stress with a foster child. She eats protein at all meals and eats protein first. Eating small portions.  She is status post Roux-en-Y gastric bypass in 2016. No intake of sugar sweetened beverages.  Appetite and cravings are well-controlled with Mounjaro 10 mg. Assessment/Plan:  1. Nausea and vomiting For the past 4 weeks has had some vomiting with greasy, acidic foods.  She does not feel this is related to increased dose of Mounjaro.  Plan: New rx- pantoprazole 40 mg daily for one month only. If symptoms continue after that see PCP. Avoid trigger foods.  2. Type 2 Diabetes Mellitus with other specified complication, without long-term current use of insulin HgbA1c is at goal. Last A1c was 5.7 CBGs: Not checking Episodes of hypoglycemia: no Medication(s): Zepbound 10 mg SQ weekly  Lab Results  Component Value Date   HGBA1C 5.7 (H) 03/25/2022   HGBA1C  5.2 10/01/2021   HGBA1C 5.5 04/15/2021   Lab Results  Component Value Date   MICROALBUR <0.7 04/15/2021   LDLCALC 91 04/15/2021   CREATININE 1.06 (H) 06/16/2022   No results found for: "GFR"  Plan: Continue and refill Mounjaro 10 mg SQ weekly   3. Vitamin D Deficiency Vitamin D is not at goal of 50.  Most recent vitamin D level was 39.3. She is on  prescription ergocalciferol 50,000 IU weekly. Lab Results  Component Value Date   VD25OH 39.3 03/25/2022   VD25OH 49.1 10/01/2021   VD25OH 48.4 11/13/2020    Plan: Continue and refill  prescription ergocalciferol 50,000 IU weekly   4. Morbid Obesity: Current BMI 35 Katrina Beard is currently in the action stage of change. As such, her goal is to continue with weight loss efforts.  She has agreed to the Category 2 plan.  Encouraged better compliance with category 2 plan.  Exercise goals:  as is  Behavioral modification strategies: increasing lean protein intake, decreasing simple carbohydrates , and planning for success.  Katrina Beard has agreed to follow-up with our clinic in 4 weeks.  No orders of the defined types were placed in this encounter.   Medications Discontinued During This Encounter  Medication Reason   Vitamin D, Ergocalciferol, (DRISDOL) 1.25 MG (50000 UNIT) CAPS capsule Reorder   tirzepatide Katrina Beard) 10 MG/0.5ML Pen Reorder     Meds ordered this encounter  Medications   pantoprazole (PROTONIX) 40 MG tablet    Sig: Take 1 tablet (40 mg total) by mouth daily.    Dispense:  30 tablet    Refill:  0    Order Specific Question:   Supervising Provider    Answer:   Katrina Beard   Vitamin D, Ergocalciferol, (DRISDOL) 1.25 MG (50000 UNIT) CAPS capsule    Sig: Take 1 capsule (50,000 Units total) by mouth every 7 (seven) days.    Dispense:  4 capsule    Refill:  0    Order Specific Question:   Supervising Provider    Answer:   Katrina Beard [[id number]]   tirzepatide (MOUNJARO) 10 MG/0.5ML Pen    Sig: Inject 10 mg  into the skin once a week.    Dispense:  6 mL    Refill:  0    Order Specific Question:   Supervising Provider    Answer:   Katrina Beard [[id number]]      Objective:   VITALS: Per patient if applicable, see vitals. GENERAL: Alert and in no acute distress. CARDIOPULMONARY: No increased WOB. Speaking in clear sentences.  PSYCH: Pleasant and cooperative. Speech normal rate and rhythm. Affect is appropriate. Insight and judgement are appropriate. Attention is focused, linear, and appropriate.  NEURO: Oriented as arrived to appointment on time with no prompting.   Attestation Statements:   Reviewed by clinician on day of visit: allergies, medications, problem list, medical history, surgical history, family history, social history, and previous encounter notes.   This was prepared with the assistance of Engineer, civil (consulting).  Occasional wrong-word or sound-a-like substitutions may have occurred due to the inherent limitations of voice recognition software.

## 2022-09-30 ENCOUNTER — Other Ambulatory Visit (HOSPITAL_COMMUNITY): Payer: Self-pay

## 2022-09-30 ENCOUNTER — Telehealth (INDEPENDENT_AMBULATORY_CARE_PROVIDER_SITE_OTHER): Payer: BC Managed Care – PPO | Admitting: Family Medicine

## 2022-09-30 ENCOUNTER — Encounter (INDEPENDENT_AMBULATORY_CARE_PROVIDER_SITE_OTHER): Payer: Self-pay | Admitting: Family Medicine

## 2022-09-30 DIAGNOSIS — R112 Nausea with vomiting, unspecified: Secondary | ICD-10-CM | POA: Diagnosis not present

## 2022-09-30 DIAGNOSIS — E559 Vitamin D deficiency, unspecified: Secondary | ICD-10-CM | POA: Diagnosis not present

## 2022-09-30 DIAGNOSIS — E1169 Type 2 diabetes mellitus with other specified complication: Secondary | ICD-10-CM | POA: Diagnosis not present

## 2022-09-30 DIAGNOSIS — Z6835 Body mass index (BMI) 35.0-35.9, adult: Secondary | ICD-10-CM

## 2022-09-30 MED ORDER — VITAMIN D (ERGOCALCIFEROL) 1.25 MG (50000 UNIT) PO CAPS
50000.0000 [IU] | ORAL_CAPSULE | ORAL | 0 refills | Status: DC
Start: 2022-09-30 — End: 2022-11-01
  Filled 2022-09-30: qty 4, 28d supply, fill #0

## 2022-09-30 MED ORDER — TIRZEPATIDE 10 MG/0.5ML ~~LOC~~ SOAJ
10.0000 mg | SUBCUTANEOUS | 0 refills | Status: DC
Start: 2022-09-30 — End: 2022-11-01
  Filled 2022-09-30: qty 2, 28d supply, fill #0

## 2022-09-30 MED ORDER — PANTOPRAZOLE SODIUM 40 MG PO TBEC
40.0000 mg | DELAYED_RELEASE_TABLET | Freq: Every day | ORAL | 0 refills | Status: DC
Start: 2022-09-30 — End: 2022-11-01
  Filled 2022-09-30: qty 30, 30d supply, fill #0

## 2022-10-01 ENCOUNTER — Other Ambulatory Visit (HOSPITAL_COMMUNITY): Payer: Self-pay

## 2022-10-03 ENCOUNTER — Other Ambulatory Visit: Payer: Self-pay | Admitting: Hematology and Oncology

## 2022-10-31 ENCOUNTER — Other Ambulatory Visit: Payer: Self-pay | Admitting: Hematology and Oncology

## 2022-11-01 ENCOUNTER — Ambulatory Visit (INDEPENDENT_AMBULATORY_CARE_PROVIDER_SITE_OTHER): Payer: BC Managed Care – PPO | Admitting: Adult Health

## 2022-11-01 ENCOUNTER — Encounter (INDEPENDENT_AMBULATORY_CARE_PROVIDER_SITE_OTHER): Payer: Self-pay | Admitting: Adult Health

## 2022-11-01 ENCOUNTER — Other Ambulatory Visit: Payer: Self-pay

## 2022-11-01 ENCOUNTER — Other Ambulatory Visit (HOSPITAL_COMMUNITY): Payer: Self-pay

## 2022-11-01 VITALS — BP 122/73 | HR 89 | Temp 98.0°F | Ht 62.0 in | Wt 200.0 lb

## 2022-11-01 DIAGNOSIS — Z6835 Body mass index (BMI) 35.0-35.9, adult: Secondary | ICD-10-CM

## 2022-11-01 DIAGNOSIS — E559 Vitamin D deficiency, unspecified: Secondary | ICD-10-CM

## 2022-11-01 DIAGNOSIS — E1169 Type 2 diabetes mellitus with other specified complication: Secondary | ICD-10-CM | POA: Diagnosis not present

## 2022-11-01 DIAGNOSIS — R112 Nausea with vomiting, unspecified: Secondary | ICD-10-CM

## 2022-11-01 DIAGNOSIS — E1159 Type 2 diabetes mellitus with other circulatory complications: Secondary | ICD-10-CM

## 2022-11-01 DIAGNOSIS — Z7985 Long-term (current) use of injectable non-insulin antidiabetic drugs: Secondary | ICD-10-CM

## 2022-11-01 DIAGNOSIS — I152 Hypertension secondary to endocrine disorders: Secondary | ICD-10-CM

## 2022-11-01 DIAGNOSIS — E669 Obesity, unspecified: Secondary | ICD-10-CM

## 2022-11-01 MED ORDER — SEMAGLUTIDE (2 MG/DOSE) 8 MG/3ML ~~LOC~~ SOPN
2.0000 mg | PEN_INJECTOR | SUBCUTANEOUS | 0 refills | Status: DC
  Filled 2022-11-01: qty 3, 28d supply, fill #0

## 2022-11-01 MED ORDER — VITAMIN D (ERGOCALCIFEROL) 1.25 MG (50000 UNIT) PO CAPS
50000.0000 [IU] | ORAL_CAPSULE | ORAL | 0 refills | Status: DC
  Filled 2022-11-01: qty 4, 28d supply, fill #0

## 2022-11-01 MED ORDER — PANTOPRAZOLE SODIUM 40 MG PO TBEC
40.0000 mg | DELAYED_RELEASE_TABLET | Freq: Every day | ORAL | 0 refills | Status: DC
  Filled 2022-11-01: qty 30, 30d supply, fill #0

## 2022-11-01 NOTE — Progress Notes (Signed)
WEIGHT SUMMARY AND BIOMETRICS  Vitals Temp: 98 F (36.7 C) BP: 122/73 Pulse Rate: 89 SpO2: 100 %   Anthropometric Measurements Height: 5\' 2"  (1.575 m) Weight: 200 lb (90.7 kg) BMI (Calculated): 36.57 Weight at Last Visit: 195lb Weight Lost Since Last Visit: 0 Weight Gained Since Last Visit: 5lb Starting Weight: 231lb Total Weight Loss (lbs): 31 lb (14.1 kg)   Body Composition  Body Fat %: 49.5 % Fat Mass (lbs): 99.4 lbs Muscle Mass (lbs): 96.2 lbs Total Body Water (lbs): 80.4 lbs Visceral Fat Rating : 15   Other Clinical Data Fasting: no Labs: no Today's Visit #: 38 Starting Date: 04/03/19    Chief Complaint:   OBESITY Katrina Beard is here to discuss her progress with her obesity treatment plan. She is on the practicing portion control and making smarter food choices, such as increasing vegetables and decreasing simple carbohydrates and states she is following her eating plan approximately 75 % of the time. She states she is exercising Walking 30 minutes 2 times per week.   Interim History:  05/27/2022 Wegovy 2.4 replaced with Mounjaro 7.5mg  once weekly injection  07/22/2022 Mounjaro 7.5mg  increase to 10mg  due to worsening polyphagia Increased appetite the last several weeks, despite weekly Mounjaro 10mg  09/30/2022 [facility] virtual OV- provided Protonix 40mg  every day to treat N/V She held [facility] 10mg  injection last week- reports resolution of N/V/GERD sx's  She feels that [facility] therapy isn't helping with weight loss and she went to [facility] for consult.  She was previously on Wegovy 2.4- tolerated well  Subjective:   1. Type 2 diabetes mellitus with other specified complication, without long-term current use of insulin (HCC) Lab Results  Component Value Date   HGBA1C 5.7 (H) 03/25/2022   HGBA1C 5.2 10/01/2021   HGBA1C 5.5 04/15/2021  05/27/2022 Wegovy 2.4 replaced with Mounjaro 7.5mg  once weekly injection  07/22/2022 Mounjaro 7.5mg  increase to 10mg  due to  worsening polyphagia Increased appetite the last several weeks, despite weekly Mounjaro 10mg  09/30/2022 [facility] virtual OV- provided Protonix 40mg  every day to treat N/V She held [facility] 10mg  injection last week- reports resolution of N/V/GERD sx's  Fasting CBG since holding Mounjarp therapy, high 90s  2. Hypertension associated with type 2 diabetes mellitus (HCC) Repeat BP at goal at OV today PCP recently reduced Amlodipine from 10mg  to 2.5mg , continued Valsartan 320mg  every day  3. Vitamin D deficiency  Latest Reference Range & Units 10/01/21 11:48 03/25/22 10:42  Vitamin D, 25-Hydroxy 30.0 - 100.0 ng/mL 49.1 39.3   She is on weekly Ergocalciferol- denies N/V/Muscle Weakness  4. Nausea and vomiting, unspecified vomiting type 09/30/2022 [facility] virtual OV- provided Protonix 40mg  every day to treat N/V She held [facility] 10mg  injection last week- reports resolution of N/V/GERD sx's She denies CP with exertion  Assessment/Plan:   1. Type 2 diabetes mellitus with other specified complication, without long-term current use of insulin (HCC) STOP Mounjaro Re-start Ozempic  Semaglutide, 2 MG/DOSE, 8 MG/3ML SOPN Inject 2 mg into the skin once a week. Dispense: 3 mL, Refills: 0 of 0 remaining   2. Hypertension associated with type 2 diabetes mellitus (HCC) Continue Amlodipine 2.5mg  and  Valsartan 320mg  every day  3. Vitamin D deficiency Refill - Vitamin D, Ergocalciferol, (DRISDOL) 1.25 MG (50000 UNIT) CAPS capsule; Take 1 capsule (50,000 Units total) by mouth every 7 (seven) days.  Dispense: 4 capsule; Refill: 0  4. Nausea and vomiting, unspecified vomiting type Refill - pantoprazole (PROTONIX) 40 MG tablet; Take 1 tablet (40  mg total) by mouth daily.  Dispense: 30 tablet; Refill: 0  5. Current BMI 35.57  Elanda is currently in the action stage of change. As such, her goal is to continue with weight loss efforts. She has agreed to practicing portion control and making smarter food choices,  such as increasing vegetables and decreasing simple carbohydrates.   Exercise goals: For substantial health benefits, adults should do at least 150 minutes (2 hours and 30 minutes) a week of moderate-intensity, or 75 minutes (1 hour and 15 minutes) a week of vigorous-intensity aerobic physical activity, or an equivalent combination of moderate- and vigorous-intensity aerobic activity. Aerobic activity should be performed in episodes of at least 10 minutes, and preferably, it should be spread throughout the week.  Behavioral modification strategies: increasing lean protein intake, decreasing simple carbohydrates, increasing vegetables, increasing water intake, no skipping meals, meal planning and cooking strategies, keeping healthy foods in the home, ways to avoid boredom eating, and planning for success.  Malaney has agreed to follow-up with our clinic in 4 weeks. She was informed of the importance of frequent follow-up visits to maximize her success with intensive lifestyle modifications for her multiple health conditions.   Check Fasting Labs at next OV  Objective:   Blood pressure 122/73, pulse 89, temperature 98 F (36.7 C), height 5\' 2"  (1.575 m), weight 200 lb (90.7 kg), SpO2 100%. Body mass index is 36.58 kg/m.  General: Cooperative, alert, well developed, in no acute distress. HEENT: Conjunctivae and lids unremarkable. Cardiovascular: Regular rhythm.  Lungs: Normal work of breathing. Neurologic: No focal deficits.   Lab Results  Component Value Date   CREATININE 1.06 (H) 06/16/2022   BUN 15 06/16/2022   NA 143 06/16/2022   K 3.7 06/16/2022   CL 105 06/16/2022   CO2 26 06/16/2022   Lab Results  Component Value Date   ALT 35 (H) 03/25/2022   AST 24 03/25/2022   ALKPHOS 88 03/25/2022   BILITOT 0.5 03/25/2022   Lab Results  Component Value Date   HGBA1C 5.7 (H) 03/25/2022   HGBA1C 5.2 10/01/2021   HGBA1C 5.5 04/15/2021   HGBA1C 5.5 11/13/2020   HGBA1C 6.0 (H) 06/24/2020    Lab Results  Component Value Date   INSULIN 5.2 03/25/2022   INSULIN 6.1 10/01/2021   INSULIN 11.2 04/03/2019   Lab Results  Component Value Date   TSH 0.575 04/03/2019   Lab Results  Component Value Date   CHOL 161 04/15/2021   HDL 55 04/15/2021   LDLCALC 91 04/15/2021   TRIG 80 04/15/2021   Lab Results  Component Value Date   VD25OH 39.3 03/25/2022   VD25OH 49.1 10/01/2021   VD25OH 48.4 11/13/2020   Lab Results  Component Value Date   WBC 6.5 04/15/2021   HGB 13.0 04/15/2021   HCT 39 04/15/2021   MCV 86 06/24/2020   PLT 279 04/15/2021   Lab Results  Component Value Date   IRON 162 04/15/2021   TIBC 252 06/24/2020   FERRITIN 64 06/24/2020    Attestation Statements:   Reviewed by clinician on day of visit: allergies, medications, problem list, medical history, surgical history, family history, social history, and previous encounter notes.  I have reviewed the above documentation for accuracy and completeness, and I agree with the above. -  Wrenn Willcox d. Murriel Eidem, NP-C

## 2022-11-01 NOTE — Telephone Encounter (Signed)
Per last OV, continue tamoxifen. Gardiner Rhyme, RN

## 2022-11-04 ENCOUNTER — Inpatient Hospital Stay: Payer: BC Managed Care – PPO | Attending: Hematology and Oncology | Admitting: Hematology and Oncology

## 2022-11-04 VITALS — BP 145/72 | HR 82 | Temp 97.5°F | Resp 18 | Ht 62.0 in | Wt 201.3 lb

## 2022-11-04 DIAGNOSIS — C50412 Malignant neoplasm of upper-outer quadrant of left female breast: Secondary | ICD-10-CM

## 2022-11-04 DIAGNOSIS — Z170 Estrogen receptor positive status [ER+]: Secondary | ICD-10-CM | POA: Insufficient documentation

## 2022-11-04 DIAGNOSIS — R635 Abnormal weight gain: Secondary | ICD-10-CM | POA: Insufficient documentation

## 2022-11-04 DIAGNOSIS — D0512 Intraductal carcinoma in situ of left breast: Secondary | ICD-10-CM | POA: Diagnosis present

## 2022-11-04 DIAGNOSIS — Z923 Personal history of irradiation: Secondary | ICD-10-CM | POA: Diagnosis not present

## 2022-11-04 DIAGNOSIS — Z79899 Other long term (current) drug therapy: Secondary | ICD-10-CM | POA: Insufficient documentation

## 2022-11-04 NOTE — Progress Notes (Signed)
BRIEF ONCOLOGIC HISTORY:   Oncology History  Malignant neoplasm of upper-outer quadrant of left breast in female, estrogen receptor positive (HCC)  05/15/2021 Initial Diagnosis   Left breast UOQ biopsy: DCIS with calcifications in the upper outer anterior calcifications.  ER 100%/PR70% Upper outer posterior biopsy: complex sclerosing lesion with atypia and calcifications.      06/11/2021 Surgery   Left lumpectomy: DCIS 1.5cm, margins negative, CSL with intraductal papilloma noted.   06/11/2021 Cancer Staging   Staging form: Breast, AJCC 8th Edition - Pathologic stage from 06/11/2021: Stage 0 (pTis (DCIS), pN0, cM0, ER+, PR+) - Signed by Loa Socks, NP on 10/01/2021   06/15/2021 Genetic Testing   CustomNext+RNA cancer panel found no pathogenic mutations.    The CustomNext-Cancer+RNAinsight panel offered by [facility] includes sequencing and rearrangement analysis for the following 47 genes:  APC, ATM, AXIN2, BARD1, BMPR1A, BRCA1, BRCA2, BRIP1, CDH1, CDK4, CDKN2A, CHEK2, DICER1, EPCAM, GREM1, HOXB13, MEN1, MLH1, MSH2, MSH3, MSH6, MUTYH, NBN, NF1, NF2, NTHL1, PALB2, PMS2, POLD1, POLE, PTEN, RAD51C, RAD51D, RECQL, RET, SDHA, SDHAF2, SDHB, SDHC, SDHD, SMAD4, SMARCA4, STK11, TP53, TSC1, TSC2, and VHL.  RNA data is routinely analyzed for use in variant interpretation for all genes.    07/16/2021 - 08/14/2021 Radiation Therapy   Site Technique Total Dose (Gy) Dose per Fx (Gy) Completed Fx Beam Energies  Breast, Left: Breast_L 3D 42.56/42.56 2.66 16/16 10X  Breast, Left: Breast_L_Bst 3D 8/8 2 4/4 10X     08/2021 -  Anti-estrogen oral therapy   Tamoxifen daily    INTERVAL HISTORY:   She is here for follow up. She is taking Tamoxifen well. Since her last visit here, she is compliant. Her main complaint today is weight gain. She has been working with weight loss clinic, and has lost about 40 lbs, but she says she has now gained almost 20 lbs back.  Otherwise she denies any major  complaints with tamoxifen.  No lower extremity swelling, no unexpected vaginal bleeding.  She is motivated to continue the medication but is hoping to keep the weight down since she has been working very hard.  No breast changes reported. Rest of the pertinent 10 point ROS reviewed and neg.  REVIEW OF SYSTEMS:  Review of Systems  Constitutional:  Negative for appetite change, chills, fatigue, fever and unexpected weight change.  HENT:   Negative for hearing loss, lump/mass and trouble swallowing.   Eyes:  Negative for eye problems and icterus.  Respiratory:  Negative for chest tightness, cough and shortness of breath.   Cardiovascular:  Negative for chest pain, leg swelling and palpitations.  Gastrointestinal:  Negative for abdominal distention, abdominal pain, constipation, diarrhea, nausea and vomiting.  Endocrine: Negative for hot flashes.  Genitourinary:  Negative for difficulty urinating.   Musculoskeletal:  Negative for arthralgias.  Skin:  Negative for itching and rash.  Neurological:  Negative for dizziness, extremity weakness, headaches and numbness.  Hematological:  Negative for adenopathy. Does not bruise/bleed easily.  Psychiatric/Behavioral:  Negative for depression. The patient is not nervous/anxious.    ONCOLOGY TREATMENT TEAM:  1. Surgeon:  Dr. Luisa Hart at [facility] 2. Medical Oncologist: Dr. Al Pimple 3. Radiation Oncologist: Dr. Mitzi Hansen    PAST MEDICAL/SURGICAL HISTORY:  Past Medical History:  Diagnosis Date   Asthma    Breast cancer (HCC)    CAD (coronary artery disease)    CHF (congestive heart failure) (HCC)    Diabetes mellitus    Gallbladder problem    GERD (gastroesophageal reflux disease)  HLD (hyperlipidemia)    Hypertension    Joint pain    Kidney stone    Knee pain    MI, old 2003   no stent placement   Personal history of radiation therapy    Vitamin D deficiency    Past Surgical History:  Procedure Laterality Date   BREAST BIOPSY  Left 05/15/2021   x2   BREAST BIOPSY Left 04/19/2022   MM LT BREAST BX W LOC DEV 1ST LESION IMAGE BX SPEC STEREO GUIDE 04/19/2022 [facility]   BREAST BIOPSY  06/21/2022   MM LT RADIOACTIVE SEED LOC MAMMO GUIDE 06/21/2022 [facility]   BREAST EXCISIONAL BIOPSY Bilateral    BREAST EXCISIONAL BIOPSY Left 06/11/2021   BREAST LUMPECTOMY Left 06/11/2021   BREAST LUMPECTOMY WITH RADIOACTIVE SEED LOCALIZATION Left 06/11/2021   Procedure: LEFT BREAST LUMPECTOMY WITH RADIOACTIVE SEED LOCALIZATION X2;  Surgeon: Harriette Bouillon, MD;  Location: [facility];  Service: General;  Laterality: Left;   BREAST LUMPECTOMY WITH RADIOACTIVE SEED LOCALIZATION Left 06/22/2022   Procedure: LEFT BREAST LUMPECTOMY WITH RADIOACTIVE SEED LOCALIZATION;  Surgeon: Harriette Bouillon, MD;  Location:  SURGERY CENTER;  Service: General;  Laterality: Left;   BREAST SURGERY     CHOLECYSTECTOMY     CYSTOSCOPY W/ URETERAL STENT PLACEMENT Right 07/25/2016   Procedure: CYSTOSCOPY WITH RETROGRADE PYELOGRAM/LEFT URETERAL STENT PLACEMENT;  Surgeon: Crist Fat, MD;  Location: [facility];  Service: Urology;  Laterality: Right;   HAND SURGERY     ROUX-EN-Y GASTRIC BYPASS     07/15/2014     ALLERGIES:  Allergies  Allergen Reactions   Bisoprolol Fumarate Other (See Comments)   Hydrochlorothiazide     Other Reaction(s): low K   Ibuprofen Other (See Comments)    Gastric bypass surgery-can't take anymore   Liraglutide Other (See Comments)   Lisinopril-Hydrochlorothiazide Other (See Comments)    Arm numbness and tongue blisters     CURRENT MEDICATIONS:  Outpatient Encounter Medications as of 11/04/2022  Medication Sig   ALBUTEROL IN Inhale into the lungs.   amLODipine (NORVASC) 10 MG tablet 1 tab by mouth daily. LAST REFILL FROM [facility]- NEX FROM PCP/DR. WOLTERS (Patient taking differently: Take 2.5 mg by mouth daily. 1 tab by mouth daily. LAST REFILL FROM [facility]- NEX FROM PCP/DR. WOLTERS)   aspirin EC 81  MG tablet Take 81 mg by mouth daily.   atorvastatin (LIPITOR) 20 MG tablet Take 20 mg by mouth daily.   blood glucose meter kit and supplies KIT Dispense based on patient and insurance preference. Use up to four times daily as directed.   ferrous sulfate (FERROUSUL) 325 (65 FE) MG tablet Take 1 tablet (325 mg total) by mouth daily with breakfast.   fluticasone (FLONASE) 50 MCG/ACT nasal spray Place 1 spray into both nostrils in the morning and at bedtime.   Multiple Vitamins-Iron (MULTIVITAMIN/IRON PO) Take by mouth.   pantoprazole (PROTONIX) 40 MG tablet Take 1 tablet (40 mg total) by mouth daily.   Semaglutide, 2 MG/DOSE, 8 MG/3ML SOPN Inject 2 mg into the skin once a week.   tamoxifen (NOLVADEX) 20 MG tablet Take 1 tablet by mouth once daily   valsartan (DIOVAN) 320 MG tablet Take 320 mg by mouth daily.   Vitamin D, Ergocalciferol, (DRISDOL) 1.25 MG (50000 UNIT) CAPS capsule Take 1 capsule (50,000 Units total) by mouth every 7 (seven) days.   No facility-administered encounter medications on file as of 11/04/2022.     ONCOLOGIC FAMILY HISTORY:  Family History  Problem Relation Age of Onset   Breast cancer Mother 45   Hypertension Mother    Hyperlipidemia Mother    Obesity Mother    Hypertension Father    Heart disease Father      SOCIAL HISTORY:  Social History   Socioeconomic History   Marital status: Single    Spouse name: Not on file   Number of children: Not on file   Years of education: Not on file   Highest education level: Not on file  Occupational History   Occupation: Surveyor, mining  Tobacco Use   Smoking status: Never   Smokeless tobacco: Never  Vaping Use   Vaping status: Never Used  Substance and Sexual Activity   Alcohol use: Yes    Comment: occassional   Drug use: No   Sexual activity: Not on file  Other Topics Concern   Not on file  Social History Narrative   Not on file   Social Determinants of Health   Financial Resource Strain: Not on  file  Food Insecurity: Not on file  Transportation Needs: Not on file  Physical Activity: Not on file  Stress: Not on file  Social Connections: Not on file  Intimate Partner Violence: Not on file     OBSERVATIONS/OBJECTIVE:  BP (!) 145/72 (BP Location: Left Arm, Patient Position: Sitting)   Pulse 82   Temp (!) 97.5 F (36.4 C) (Temporal)   Resp 18   Ht 5\' 2"  (1.575 m)   Wt 201 lb 4.8 oz (91.3 kg)   SpO2 100%   BMI 36.82 kg/m   GENERAL: Patient is a well appearing female in no acute distress HEENT:  Sclerae anicteric.  Oropharynx clear and moist. No ulcerations or evidence of oropharyngeal candidiasis. Neck is supple.  NODES:  No cervical, supraclavicular, or axillary lymphadenopathy palpated.  BREAST EXAM:  left breast s/p lumpectomy and radiation, no sign of local recurrence, very large seroma noted in left breast, right breast benign LUNGS:  Clear to auscultation bilaterally.  No wheezes or rhonchi. HEART:  Regular rate and rhythm. No murmur appreciated. ABDOMEN:  Soft, nontender.  Positive, normoactive bowel sounds. No organomegaly palpated. MSK:  No focal spinal tenderness to palpation. Full range of motion bilaterally in the upper extremities. EXTREMITIES:  + left leg edema. SKIN:  Clear with no obvious rashes or skin changes. No nail dyscrasia. NEURO:  Nonfocal. Well oriented.  Appropriate affect.   LABORATORY DATA:  None for this visit.  DIAGNOSTIC IMAGING:  None for this visit.   ASSESSMENT AND PLAN:   Ms.. Katrina Beard is a pleasant 62 y.o. female with Stage 0 left DCIS, ER+/PR+, diagnosed in 04/2021, treated with lumpectomy, adjuvant radiation therapy, and anti-estrogen therapy with Tamoxifen beginning in 08/2021.  She remains compliant with tamoxifen and is here for follow-up  No concerns on PE except chronically inverted nipple, hyperpigmentation of left breast and scar tissue. She had left breast lumpectomy which showed focal usual ductal hyperplasia, no evidence of  ADH or malignancy. Continue Tamoxifen as prescribed.  I have encouraged her to work with the weight loss clinic regarding the weight gain. She appears to have no toxicity from tamoxifen otherwise.  No concerns on breast exam for recurrence. RTC in 6 months  The patient was provided an opportunity to ask questions and all were answered. The patient agreed with the plan and demonstrated an understanding of the instructions.   Total encounter time:30 minutes*in face-to-face visit time, chart review, lab review, care coordination, order entry,  and documentation of the encounter time. *Total Encounter Time as defined by the Centers for Medicare and Medicaid Services includes, in addition to the face-to-face time of a patient visit (documented in the note above) non-face-to-face time: obtaining and reviewing outside history, ordering and reviewing medications, tests or procedures, care coordination (communications with other health care professionals or caregivers) and documentation in the medical record.

## 2022-11-18 ENCOUNTER — Other Ambulatory Visit: Payer: Self-pay | Admitting: Family Medicine

## 2022-11-18 ENCOUNTER — Ambulatory Visit
Admission: RE | Admit: 2022-11-18 | Discharge: 2022-11-18 | Disposition: A | Discharge status: Home / Self Care | Payer: BC Managed Care – PPO | Source: Ambulatory Visit | Attending: Family Medicine | Admitting: Family Medicine

## 2022-11-18 DIAGNOSIS — M5412 Radiculopathy, cervical region: Secondary | ICD-10-CM

## 2022-12-01 ENCOUNTER — Other Ambulatory Visit: Payer: Self-pay | Admitting: Hematology and Oncology

## 2022-12-02 ENCOUNTER — Other Ambulatory Visit (HOSPITAL_COMMUNITY): Payer: Self-pay

## 2022-12-02 ENCOUNTER — Ambulatory Visit (INDEPENDENT_AMBULATORY_CARE_PROVIDER_SITE_OTHER): Payer: BC Managed Care – PPO | Admitting: Adult Health

## 2022-12-02 ENCOUNTER — Other Ambulatory Visit (INDEPENDENT_AMBULATORY_CARE_PROVIDER_SITE_OTHER): Payer: Self-pay | Admitting: Adult Health

## 2022-12-02 ENCOUNTER — Encounter (INDEPENDENT_AMBULATORY_CARE_PROVIDER_SITE_OTHER): Payer: Self-pay | Admitting: Adult Health

## 2022-12-02 ENCOUNTER — Telehealth (INDEPENDENT_AMBULATORY_CARE_PROVIDER_SITE_OTHER): Payer: Self-pay

## 2022-12-02 VITALS — BP 150/82 | HR 65 | Temp 97.7°F | Ht 62.0 in | Wt 196.0 lb

## 2022-12-02 DIAGNOSIS — E669 Obesity, unspecified: Secondary | ICD-10-CM

## 2022-12-02 DIAGNOSIS — E559 Vitamin D deficiency, unspecified: Secondary | ICD-10-CM | POA: Diagnosis not present

## 2022-12-02 DIAGNOSIS — Z6841 Body Mass Index (BMI) 40.0 and over, adult: Secondary | ICD-10-CM

## 2022-12-02 DIAGNOSIS — Z6835 Body mass index (BMI) 35.0-35.9, adult: Secondary | ICD-10-CM

## 2022-12-02 DIAGNOSIS — I152 Hypertension secondary to endocrine disorders: Secondary | ICD-10-CM | POA: Diagnosis not present

## 2022-12-02 DIAGNOSIS — Z7985 Long-term (current) use of injectable non-insulin antidiabetic drugs: Secondary | ICD-10-CM

## 2022-12-02 DIAGNOSIS — E1159 Type 2 diabetes mellitus with other circulatory complications: Secondary | ICD-10-CM | POA: Diagnosis not present

## 2022-12-02 DIAGNOSIS — E1169 Type 2 diabetes mellitus with other specified complication: Secondary | ICD-10-CM | POA: Diagnosis not present

## 2022-12-02 MED ORDER — SEMAGLUTIDE (2 MG/DOSE) 8 MG/3ML ~~LOC~~ SOPN
2.0000 mg | PEN_INJECTOR | SUBCUTANEOUS | 0 refills | Status: DC
Start: 2022-12-02 — End: 2023-01-25
  Filled 2022-12-02: qty 3, 28d supply, fill #0
  Filled 2022-12-02 – 2022-12-13 (×3): qty 9, 84d supply, fill #0

## 2022-12-02 NOTE — Progress Notes (Signed)
WEIGHT SUMMARY AND BIOMETRICS  Vitals Temp: 97.7 F (36.5 C) BP: (!) 150/82 Pulse Rate: 65 SpO2: 96 %   Anthropometric Measurements Height: 5\' 2"  (1.575 m) Weight: 196 lb (88.9 kg) BMI (Calculated): 35.84 Weight at Last Visit: 200lb Weight Lost Since Last Visit: 4lb Weight Gained Since Last Visit: 0 Starting Weight: 231lb Total Weight Loss (lbs): 35 lb (15.9 kg)   Body Composition  Body Fat %: 40.7 % Fat Mass (lbs): 80 lbs Muscle Mass (lbs): 110.8 lbs Total Body Water (lbs): 79.4 lbs Visceral Fat Rating : 12   Other Clinical Data Fasting: yes Labs: no Today's Visit #: 62 Starting Date: 04/03/19    Chief Complaint:   OBESITY Katrina Beard is here to discuss her progress with her obesity treatment plan. She is on the practicing portion control and making smarter food choices, such as increasing vegetables and decreasing simple carbohydrates and states she is following her eating plan approximately 80 % of the time. She states she is not currently exercising.   Interim History:  11/01/2022 Mounjaro 10mg  replaced with Ozempic 2mg   Denies mass in neck, dysphagia, dyspepsia, persistent hoarseness, abdominal pain, or N/V/C   Reviewed Bioimpedance results with pt: Muscle Mass: +14.6 lbs Adipose Mass: -19.4 lbs  [name] has two new female foster children- been quite challenging to place them in the correct school districts- this has caused an increase in BP  Subjective:   1. Vitamin D deficiency  Latest Reference Range & Units 03/25/22 10:42  Vitamin D, 25-Hydroxy 30.0 - 100.0 ng/mL 39.3   She is on weekly Ergocalciferol-denies N/V/Muscle Weakness  2. Hypertension associated with type 2 diabetes mellitus (HCC) BP above goal PCP is managing Norvasc 2.5mg  every day and Diovan 320mg  every day  Norvasc was recently decreased from 10mg  to 2.5mg   [name] has two new female foster children- been quite challenging to place them in the correct school districts- this  has caused an increase in BP  She denies acute cardiac sx's at present  3. Type 2 diabetes mellitus with other specified complication, without long-term current use of insulin (HCC) 05/27/2022 Wegovy 2.4 replaced with Mounjaro 7.5mg  once weekly injection   07/22/2022 Mounjaro 7.5mg  increase to 10mg  due to worsening polyphagia Increased appetite the last several weeks, despite weekly Mounjaro 10mg   09/30/2022 [facility] virtual OV- provided Protonix 40mg  every day to treat N/V She held [facility] 10mg  injection last week- reports resolution of N/V/GERD sx's  11/01/2022 Mounjaro 10mg  d/c'd Started on weekly Ozempic 2mg  Denies mass in neck, dysphagia, dyspepsia, persistent hoarseness, abdominal pain, or N/V/C    Assessment/Plan:   1. Vitamin D deficiency Check Labs  2. Hypertension associated with type 2 diabetes mellitus (HCC) Check Labs  3. Type 2 diabetes mellitus with other specified complication, without long-term current use of insulin (HCC) Check Labs Refill Semaglutide, 2 MG/DOSE, 8 MG/3ML SOPN Inject 2 mg into the skin once a week. Dispense: 9 mL, Refills: 0 of 0 remaining   4. Current BMI 35.84  [name] is currently in the action stage of change. As such, her goal is to continue with weight loss efforts. She has agreed to practicing portion control and making smarter food choices, such as increasing vegetables and decreasing simple carbohydrates.   Exercise goals: All adults should avoid inactivity. Some physical activity is better than none, and adults who participate in any amount of physical activity gain some health benefits. Adults should also include muscle-strengthening activities that involve all major muscle groups on  2 or more days a week.  Behavioral modification strategies: increasing lean protein intake, decreasing simple carbohydrates, increasing vegetables, increasing water intake, meal planning and cooking strategies, and planning for success.  [name] has agreed to  follow-up with our clinic in 4 weeks. She was informed of the importance of frequent follow-up visits to maximize her success with intensive lifestyle modifications for her multiple health conditions.   [name] was informed we would discuss her lab results at her next visit unless there is a critical issue that needs to be addressed sooner. [name] agreed to keep her next visit at the agreed upon time to discuss these results.  Objective:   Blood pressure (!) 150/82, pulse 65, temperature 97.7 F (36.5 C), height 5\' 2"  (1.575 m), weight 196 lb (88.9 kg), SpO2 96%. Body mass index is 35.85 kg/m.  General: Cooperative, alert, well developed, in no acute distress. HEENT: Conjunctivae and lids unremarkable. Cardiovascular: Regular rhythm.  Lungs: Normal work of breathing. Neurologic: No focal deficits.   Lab Results  Component Value Date   CREATININE 1.06 (H) 06/16/2022   BUN 15 06/16/2022   NA 143 06/16/2022   K 3.7 06/16/2022   CL 105 06/16/2022   CO2 26 06/16/2022   Lab Results  Component Value Date   ALT 35 (H) 03/25/2022   AST 24 03/25/2022   ALKPHOS 88 03/25/2022   BILITOT 0.5 03/25/2022   Lab Results  Component Value Date   HGBA1C 5.7 (H) 03/25/2022   HGBA1C 5.2 10/01/2021   HGBA1C 5.5 04/15/2021   HGBA1C 5.5 11/13/2020   HGBA1C 6.0 (H) 06/24/2020   Lab Results  Component Value Date   INSULIN 5.2 03/25/2022   INSULIN 6.1 10/01/2021   INSULIN 11.2 04/03/2019   Lab Results  Component Value Date   TSH 0.575 04/03/2019   Lab Results  Component Value Date   CHOL 161 04/15/2021   HDL 55 04/15/2021   LDLCALC 91 04/15/2021   TRIG 80 04/15/2021   Lab Results  Component Value Date   VD25OH 39.3 03/25/2022   VD25OH 49.1 10/01/2021   VD25OH 48.4 11/13/2020   Lab Results  Component Value Date   WBC 6.5 04/15/2021   HGB 13.0 04/15/2021   HCT 39 04/15/2021   MCV 86 06/24/2020   PLT 279 04/15/2021   Lab Results  Component Value Date   IRON 162 04/15/2021    TIBC 252 06/24/2020   FERRITIN 64 06/24/2020   Attestation Statements:   Reviewed by clinician on day of visit: allergies, medications, problem list, medical history, surgical history, family history, social history, and previous encounter notes.  I have reviewed the above documentation for accuracy and completeness, and I agree with the above. -  Bethany Hirt d. Findley Vi, NP-C

## 2022-12-02 NOTE — Telephone Encounter (Signed)
PA submitted for Ozempic, waiting on a determination. 2:40 12/02/2022 KP

## 2022-12-03 ENCOUNTER — Other Ambulatory Visit (HOSPITAL_COMMUNITY): Payer: Self-pay

## 2022-12-03 LAB — COMPREHENSIVE METABOLIC PANEL
ALT: 25 IU/L (ref 0–32)
AST: 21 IU/L (ref 0–40)
Albumin: 4.1 g/dL (ref 3.8–4.9)
Alkaline Phosphatase: 96 IU/L (ref 44–121)
BUN/Creatinine Ratio: 18 (ref 12–28)
BUN: 16 mg/dL (ref 8–27)
Bilirubin Total: 0.5 mg/dL (ref 0.0–1.2)
CO2: 24 mmol/L (ref 20–29)
Calcium: 9.3 mg/dL (ref 8.7–10.3)
Chloride: 104 mmol/L (ref 96–106)
Creatinine, Ser: 0.9 mg/dL (ref 0.57–1.00)
Globulin, Total: 2.6 g/dL (ref 1.5–4.5)
Glucose: 81 mg/dL (ref 70–99)
Potassium: 3.6 mmol/L (ref 3.5–5.2)
Sodium: 144 mmol/L (ref 134–144)
Total Protein: 6.7 g/dL (ref 6.0–8.5)
eGFR: 73 mL/min/{1.73_m2} (ref >59)

## 2022-12-03 LAB — HEMOGLOBIN A1C
Est. average glucose Bld gHb Est-mCnc: 126 mg/dL
Hgb A1c MFr Bld: 6 % — ABNORMAL HIGH (ref 4.8–5.6)

## 2022-12-03 LAB — INSULIN, RANDOM: INSULIN: 6.4 u[IU]/mL (ref 2.6–24.9)

## 2022-12-03 LAB — VITAMIN D 25 HYDROXY (VIT D DEFICIENCY, FRACTURES): Vit D, 25-Hydroxy: 58.8 ng/mL (ref 30.0–100.0)

## 2022-12-03 LAB — VITAMIN B12: Vitamin B-12: 1735 pg/mL — ABNORMAL HIGH (ref 232–1245)

## 2022-12-06 ENCOUNTER — Encounter (HOSPITAL_COMMUNITY): Payer: Self-pay

## 2022-12-06 ENCOUNTER — Telehealth (INDEPENDENT_AMBULATORY_CARE_PROVIDER_SITE_OTHER): Payer: Self-pay | Admitting: Adult Health

## 2022-12-06 ENCOUNTER — Other Ambulatory Visit (HOSPITAL_COMMUNITY): Payer: Self-pay

## 2022-12-06 NOTE — Telephone Encounter (Signed)
Patient states that she did not receive her refill of Ozempic. Patient was prescribed the refill during her last visit. Pt may need a Prior Authorization. [facility].

## 2022-12-06 NOTE — Telephone Encounter (Signed)
PA for Ozempic came back as NA:  Your request has been n/a Your PA request cannot be processed. The corresponding PA request is closed. If a prior authorization is required please submit a new request. (Message 1110)  Patient notified 9:53 12/06/22 KP

## 2022-12-08 ENCOUNTER — Other Ambulatory Visit (INDEPENDENT_AMBULATORY_CARE_PROVIDER_SITE_OTHER): Payer: Self-pay

## 2022-12-08 ENCOUNTER — Other Ambulatory Visit (HOSPITAL_COMMUNITY): Payer: Self-pay

## 2022-12-08 NOTE — Telephone Encounter (Signed)
Patient called stating that she has been denied for both her Ozempic and Vitamin D. Patient would like a call back today as she did not receive a response from her call to [place] earlier this week. Please call [phone].

## 2022-12-09 NOTE — Telephone Encounter (Signed)
Phone call to patient to let her know I have re submitted the prior authorization. Waiting on determination.

## 2022-12-09 NOTE — Telephone Encounter (Signed)
Re did patients prior authorization for her Ozempic and sent over the office notes. It was denied, and per insurance her A1C was not within range. Patient and provider notified.

## 2022-12-13 ENCOUNTER — Other Ambulatory Visit (INDEPENDENT_AMBULATORY_CARE_PROVIDER_SITE_OTHER): Payer: Self-pay | Admitting: Adult Health

## 2022-12-13 ENCOUNTER — Other Ambulatory Visit (HOSPITAL_COMMUNITY): Payer: Self-pay

## 2022-12-13 ENCOUNTER — Telehealth (INDEPENDENT_AMBULATORY_CARE_PROVIDER_SITE_OTHER): Payer: Self-pay | Admitting: Adult Health

## 2022-12-13 ENCOUNTER — Encounter (HOSPITAL_COMMUNITY): Payer: Self-pay

## 2022-12-13 DIAGNOSIS — E559 Vitamin D deficiency, unspecified: Secondary | ICD-10-CM

## 2022-12-13 NOTE — Telephone Encounter (Signed)
PA for Ozempic has been approved. Pt has been notified.   Message from [employer] Your PA request has been approved. Additional information will be provided in the approval communication. (Message [id number]). Authorization Expiration Date: December 12, 2025.

## 2022-12-13 NOTE — Telephone Encounter (Signed)
Patient Katrina Beard called in stating she spoke with a representative from [facility] and was told before they will approve her refills for Semaglutide and Vitamin D, they need to see her last test results and they need an ICD 10 code.       Thank you

## 2022-12-13 NOTE — Telephone Encounter (Addendum)
Patient Katrina Beard called in stating she spoke with a representative from [facility] and was told before they will approve her refills for Semaglutide and Vitamin D, they need to see her last test results and they need an ICD 10 code.       Thank you

## 2022-12-14 ENCOUNTER — Telehealth (INDEPENDENT_AMBULATORY_CARE_PROVIDER_SITE_OTHER): Payer: Self-pay

## 2022-12-14 NOTE — Telephone Encounter (Signed)
I spoke to a representative at [facility] today and she said the Ozempic was approved on 12/13/22 unti 11/15/2025, and Vitamin D was approved as well. Patient has been notified.

## 2022-12-29 ENCOUNTER — Ambulatory Visit (INDEPENDENT_AMBULATORY_CARE_PROVIDER_SITE_OTHER): Payer: BC Managed Care – PPO | Admitting: Adult Health

## 2022-12-29 ENCOUNTER — Encounter (INDEPENDENT_AMBULATORY_CARE_PROVIDER_SITE_OTHER): Payer: Self-pay | Admitting: Adult Health

## 2022-12-29 ENCOUNTER — Other Ambulatory Visit (HOSPITAL_COMMUNITY): Payer: Self-pay

## 2022-12-29 VITALS — BP 118/70 | HR 66 | Temp 97.5°F | Ht 62.0 in | Wt 197.0 lb

## 2022-12-29 DIAGNOSIS — Z7985 Long-term (current) use of injectable non-insulin antidiabetic drugs: Secondary | ICD-10-CM

## 2022-12-29 DIAGNOSIS — E1159 Type 2 diabetes mellitus with other circulatory complications: Secondary | ICD-10-CM | POA: Diagnosis not present

## 2022-12-29 DIAGNOSIS — E669 Obesity, unspecified: Secondary | ICD-10-CM | POA: Diagnosis not present

## 2022-12-29 DIAGNOSIS — I152 Hypertension secondary to endocrine disorders: Secondary | ICD-10-CM

## 2022-12-29 DIAGNOSIS — E559 Vitamin D deficiency, unspecified: Secondary | ICD-10-CM | POA: Diagnosis not present

## 2022-12-29 DIAGNOSIS — E1169 Type 2 diabetes mellitus with other specified complication: Secondary | ICD-10-CM

## 2022-12-29 DIAGNOSIS — Z6836 Body mass index (BMI) 36.0-36.9, adult: Secondary | ICD-10-CM

## 2022-12-29 MED ORDER — VITAMIN D (ERGOCALCIFEROL) 1.25 MG (50000 UNIT) PO CAPS
50000.0000 [IU] | ORAL_CAPSULE | ORAL | 0 refills | Status: DC
  Filled 2022-12-29: qty 4, 28d supply, fill #0

## 2022-12-29 NOTE — Progress Notes (Addendum)
 WEIGHT SUMMARY AND BIOMETRICS  Vitals Temp: (!) 97.5 F (36.4 C) BP: 118/70 Pulse Rate: 66 SpO2: 98 %   Anthropometric Measurements Height: 5\' 2"  (1.575 m) Weight: 197 lb (89.4 kg) BMI (Calculated): 36.02 Weight at Last Visit: 196lb Weight Lost Since Last Visit: 0 Weight Gained Since Last Visit: 1lb Starting Weight: 231lb Total Weight Loss (lbs): 34 lb (15.4 kg)   Body Composition  Body Fat %: 47.3 % Fat Mass (lbs): 93.4 lbs Muscle Mass (lbs): 98.8 lbs Total Body Water (lbs): 75.8 lbs Visceral Fat Rating : 14   Other Clinical Data Fasting: no Labs: no Today's Visit #: 54 Starting Date: 04/03/19    Chief Complaint:   OBESITY Katrina Beard is here to discuss her progress with her obesity treatment plan. She is on the practicing portion control and making smarter food choices, such as increasing vegetables and decreasing simple carbohydrates and states she is following her eating plan approximately 80 % of the time. She states she is not currently exercising.   Interim History:  Katrina Beard has two foster children, age 32 ([employer]) and age 94 ([place])  Both girls are settling in and this week has been relatively drama free.  She was able to finally obtain Ozempic 2mg  on/about 12/28/2022  Subjective:   1. Hypertension associated with type 2 diabetes mellitus (HCC) Discussed Labs  12/02/2022 CMP- Electrolytes and kidney fx- stable  BP at goal at OV  2. Type 2 diabetes mellitus with other specified complication, without long-term current use of insulin (HCC) Dicussed Labs  Latest Reference Range & Units 12/02/22 12:31  Glucose 70 - 99 mg/dL 81  Hemoglobin Z6X 4.8 - 5.6 % 6.0 (H)  Est. average glucose Bld gHb Est-mCnc mg/dL 096  INSULIN 2.6 - 04.5 uIU/mL 6.4  (H): Data is abnormally high  Levels stable  05/27/2022 Wegovy 2.4 replaced with Mounjaro 7.5mg  once weekly injection   07/22/2022 Mounjaro 7.5mg  increase to 10mg  due to worsening  polyphagia Increased appetite the last several weeks, despite weekly Mounjaro 10mg    09/30/2022 [facility] virtual OV- provided Protonix 40mg  every day to treat N/V She held [facility] 10mg  injection last week- reports resolution of N/V/GERD sx's   11/01/2022 Mounjaro 10mg  d/c'd Started on weekly Ozempic 2mg  Conversion per pt request as she was gaining weight on Mounjaro and she felt that Ozempic "worked better".  2 week gap in therapy due to delay from pharmacy She restarted Ozempic 2mg  on/about 12/28/2022  Denies mass in neck, dysphagia, dyspepsia, persistent hoarseness, abdominal pain, or N/V/C   3. Vitamin D deficiency Discussed Labs  Latest Reference Range & Units 12/02/22 12:31  Vitamin D, 25-Hydroxy 30.0 - 100.0 ng/mL 58.8   Level stable  Assessment/Plan:   1. Hypertension associated with type 2 diabetes mellitus (HCC) Continue  aspirin EC 81 MG tablet  atorvastatin (LIPITOR) 20 MG tablet  valsartan (DIOVAN) 320 MG tablet  amLODipine (NORVASC) 10 MG tablet   2. Type 2 diabetes mellitus with other specified complication, without long-term current use of insulin (HCC) Continue weekly Ozempic 2mg , per pt she has 3 month supply at home  3. Vitamin D deficiency Refill - Vitamin D, Ergocalciferol, (DRISDOL) 1.25 MG (50000 UNIT) CAPS capsule; Take 1 capsule (50,000 Units total) by mouth every 7 (seven) days.  Dispense: 4 capsule; Refill: 0  4. Current BMI 36.02  Katrina Beard is currently in the action stage of change. As such, her goal is to continue with weight loss efforts. She has agreed to practicing  portion control and making smarter food choices, such as increasing vegetables and decreasing simple carbohydrates.   Exercise goals: For substantial health benefits, adults should do at least 150 minutes (2 hours and 30 minutes) a week of moderate-intensity, or 75 minutes (1 hour and 15 minutes) a week of vigorous-intensity aerobic physical activity, or an equivalent combination of moderate-  and vigorous-intensity aerobic activity. Aerobic activity should be performed in episodes of at least 10 minutes, and preferably, it should be spread throughout the week.  Behavioral modification strategies: increasing lean protein intake, decreasing simple carbohydrates, increasing vegetables, increasing water intake, no skipping meals, meal planning and cooking strategies, keeping healthy foods in the home, and planning for success.  Katrina Beard has agreed to follow-up with our clinic in 4 weeks. She was informed of the importance of frequent follow-up visits to maximize her success with intensive lifestyle modifications for her multiple health conditions.   Objective:   Blood pressure 118/70, pulse 66, temperature (!) 97.5 F (36.4 C), height 5\' 2"  (1.575 m), weight 197 lb (89.4 kg), SpO2 98%. Body mass index is 36.03 kg/m.  General: Cooperative, alert, well developed, in no acute distress. HEENT: Conjunctivae and lids unremarkable. Cardiovascular: Regular rhythm.  Lungs: Normal work of breathing. Neurologic: No focal deficits.   Lab Results  Component Value Date   CREATININE 0.90 12/02/2022   BUN 16 12/02/2022   NA 144 12/02/2022   K 3.6 12/02/2022   CL 104 12/02/2022   CO2 24 12/02/2022   Lab Results  Component Value Date   ALT 25 12/02/2022   AST 21 12/02/2022   ALKPHOS 96 12/02/2022   BILITOT 0.5 12/02/2022   Lab Results  Component Value Date   HGBA1C 6.0 (H) 12/02/2022   HGBA1C 5.7 (H) 03/25/2022   HGBA1C 5.2 10/01/2021   HGBA1C 5.5 04/15/2021   HGBA1C 5.5 11/13/2020   Lab Results  Component Value Date   INSULIN 6.4 12/02/2022   INSULIN 5.2 03/25/2022   INSULIN 6.1 10/01/2021   INSULIN 11.2 04/03/2019   Lab Results  Component Value Date   TSH 0.575 04/03/2019   Lab Results  Component Value Date   CHOL 161 04/15/2021   HDL 55 04/15/2021   LDLCALC 91 04/15/2021   TRIG 80 04/15/2021   Lab Results  Component Value Date   VD25OH 58.8 12/02/2022   VD25OH  39.3 03/25/2022   VD25OH 49.1 10/01/2021   Lab Results  Component Value Date   WBC 6.5 04/15/2021   HGB 13.0 04/15/2021   HCT 39 04/15/2021   MCV 86 06/24/2020   PLT 279 04/15/2021   Lab Results  Component Value Date   IRON 162 04/15/2021   TIBC 252 06/24/2020   FERRITIN 64 06/24/2020   Attestation Statements:   Reviewed by clinician on day of visit: allergies, medications, problem list, medical history, surgical history, family history, social history, and previous encounter notes.  I have reviewed the above documentation for accuracy and completeness, and I agree with the above. -  Miron Marxen d. Christophe Rising, NP-C

## 2023-01-08 ENCOUNTER — Other Ambulatory Visit: Payer: Self-pay | Admitting: Hematology and Oncology

## 2023-01-25 ENCOUNTER — Ambulatory Visit (INDEPENDENT_AMBULATORY_CARE_PROVIDER_SITE_OTHER): Payer: BC Managed Care – PPO | Admitting: Adult Health

## 2023-01-25 ENCOUNTER — Other Ambulatory Visit (HOSPITAL_COMMUNITY): Payer: Self-pay

## 2023-01-25 ENCOUNTER — Encounter (INDEPENDENT_AMBULATORY_CARE_PROVIDER_SITE_OTHER): Payer: Self-pay | Admitting: Adult Health

## 2023-01-25 VITALS — BP 135/85 | HR 77 | Temp 97.8°F | Ht 62.0 in | Wt 199.0 lb

## 2023-01-25 DIAGNOSIS — E1159 Type 2 diabetes mellitus with other circulatory complications: Secondary | ICD-10-CM

## 2023-01-25 DIAGNOSIS — Z7985 Long-term (current) use of injectable non-insulin antidiabetic drugs: Secondary | ICD-10-CM

## 2023-01-25 DIAGNOSIS — Z6841 Body Mass Index (BMI) 40.0 and over, adult: Secondary | ICD-10-CM

## 2023-01-25 DIAGNOSIS — Z6836 Body mass index (BMI) 36.0-36.9, adult: Secondary | ICD-10-CM

## 2023-01-25 DIAGNOSIS — I152 Hypertension secondary to endocrine disorders: Secondary | ICD-10-CM | POA: Diagnosis not present

## 2023-01-25 DIAGNOSIS — E559 Vitamin D deficiency, unspecified: Secondary | ICD-10-CM

## 2023-01-25 DIAGNOSIS — E669 Obesity, unspecified: Secondary | ICD-10-CM

## 2023-01-25 DIAGNOSIS — E1169 Type 2 diabetes mellitus with other specified complication: Secondary | ICD-10-CM

## 2023-01-25 MED ORDER — VITAMIN D (ERGOCALCIFEROL) 1.25 MG (50000 UNIT) PO CAPS
50000.0000 [IU] | ORAL_CAPSULE | ORAL | 0 refills | Status: DC
  Filled 2023-01-25: qty 4, 28d supply, fill #0

## 2023-01-25 MED ORDER — SEMAGLUTIDE (2 MG/DOSE) 8 MG/3ML ~~LOC~~ SOPN
2.0000 mg | PEN_INJECTOR | SUBCUTANEOUS | 0 refills | Status: DC
  Filled 2023-01-25: qty 9, 84d supply, fill #0

## 2023-01-25 NOTE — Progress Notes (Signed)
WEIGHT SUMMARY AND BIOMETRICS  Vitals Temp: 97.8 F (36.6 C) BP: 135/85 Pulse Rate: 77 SpO2: 97 %   Anthropometric Measurements Height: 5\' 2"  (1.575 m) Weight: 199 lb (90.3 kg) BMI (Calculated): 36.39 Weight at Last Visit: 197lb Weight Lost Since Last Visit: 0 Weight Gained Since Last Visit: 2lb Starting Weight: 231lb Total Weight Loss (lbs): 32 lb (14.5 kg)   Body Composition  Body Fat %: 48.1 % Fat Mass (lbs): 95.8 lbs Muscle Mass (lbs): 98.2 lbs Total Body Water (lbs): 78.6 lbs Visceral Fat Rating : 14   Other Clinical Data Fasting: no Labs: no Today's Visit #: 55 Starting Date: 04/03/19    Chief Complaint:   OBESITY Katrina Beard is here to discuss her progress with her obesity treatment plan. She is on the practicing portion control and making smarter food choices, such as increasing vegetables and decreasing simple carbohydrates and states she is following her eating plan approximately 85 % of the time. She states she is exercising [employer] Activities and park play with foster children.   Interim History:  Katrina Beard provided the following food recall that is typical of a day: Breakfast: Coffee, 1-2 eggs, grits with cheese, sausage patty Lunch: either skips or will consume Chicken kabob with rice and vegetables OR [employer] (fish sandwich, french fried) Dinner: Airline pilot or pork chops with rice/mac n cheese and vegetables  Reviewed Bioimpedance results with pt: Muscle Mass:-0.6 lb Adipose Mass:+2.4 lbs  Subjective:   1. Hypertension associated with type 2 diabetes mellitus (HCC) She is on aspirin EC 81 MG tablet  atorvastatin (LIPITOR) 20 MG tablet  valsartan (DIOVAN) 320 MG tablet  amLODipine (NORVASC) 10 MG tablet   She denies tobacco/vape use  2. Type 2 diabetes mellitus with other specified complication, without long-term current use of insulin (HCC)  Latest Reference Range & Units 12/02/22 12:31  Glucose 70 - 99 mg/dL 81  Hemoglobin X3K  4.8 - 5.6 % 6.0 (H)  Est. average glucose Bld gHb Est-mCnc mg/dL 440  INSULIN 2.6 - 10.2 uIU/mL 6.4  (H): Data is abnormally high  She is on weekly Ozmepic 2mg   Denies mass in neck, dysphagia, dyspepsia, persistent hoarseness, abdominal pain, or N/V/C  She denies sx's of hypoglycemia  3. Vitamin D deficiency  Latest Reference Range & Units 03/25/22 10:42 12/02/22 12:31  Vitamin D, 25-Hydroxy 30.0 - 100.0 ng/mL 39.3 58.8   She is on weekly Ergocalciferol- denies N/V/Muscle Weakness She endorses stable energy levels  Assessment/Plan:   1. Hypertension associated with type 2 diabetes mellitus (HCC) Continue aspirin EC 81 MG tablet  atorvastatin (LIPITOR) 20 MG tablet  valsartan (DIOVAN) 320 MG tablet  amLODipine (NORVASC) 10 MG tablet   Increase cardiovascular exercise  2. Type 2 diabetes mellitus with other specified complication, without long-term current use of insulin (HCC) Refill Semaglutide, 2 MG/DOSE, 8 MG/3ML SOPN Inject 2 mg into the skin once a week. Dispense: 9 mL, Refills: 0 of 0 remaining   3. Vitamin D deficiency Refill - Vitamin D, Ergocalciferol, (DRISDOL) 1.25 MG (50000 UNIT) CAPS capsule; Take 1 capsule (50,000 Units total) by mouth every 7 (seven) days.  Dispense: 4 capsule; Refill: 0  4. Current BMI 36.39  Katrina Beard is currently in the action stage of change. As such, her goal is to continue with weight loss efforts. She has agreed to practicing portion control and making smarter food choices, such as increasing vegetables and decreasing simple carbohydrates.   Exercise goals: For substantial health benefits, adults should  do at least 150 minutes (2 hours and 30 minutes) a week of moderate-intensity, or 75 minutes (1 hour and 15 minutes) a week of vigorous-intensity aerobic physical activity, or an equivalent combination of moderate- and vigorous-intensity aerobic activity. Aerobic activity should be performed in episodes of at least 10 minutes, and preferably, it  should be spread throughout the week.  Behavioral modification strategies: increasing lean protein intake, decreasing simple carbohydrates, increasing vegetables, increasing water intake, no skipping meals, meal planning and cooking strategies, keeping healthy foods in the home, and planning for success.  Katrina Beard has agreed to follow-up with our clinic in 4 weeks. She was informed of the importance of frequent follow-up visits to maximize her success with intensive lifestyle modifications for her multiple health conditions.   Objective:   Blood pressure 135/85, pulse 77, temperature 97.8 F (36.6 C), height 5\' 2"  (1.575 m), weight 199 lb (90.3 kg), SpO2 97%. Body mass index is 36.4 kg/m.  General: Cooperative, alert, well developed, in no acute distress. HEENT: Conjunctivae and lids unremarkable. Cardiovascular: Regular rhythm.  Lungs: Normal work of breathing. Neurologic: No focal deficits.   Lab Results  Component Value Date   CREATININE 0.90 12/02/2022   BUN 16 12/02/2022   NA 144 12/02/2022   K 3.6 12/02/2022   CL 104 12/02/2022   CO2 24 12/02/2022   Lab Results  Component Value Date   ALT 25 12/02/2022   AST 21 12/02/2022   ALKPHOS 96 12/02/2022   BILITOT 0.5 12/02/2022   Lab Results  Component Value Date   HGBA1C 6.0 (H) 12/02/2022   HGBA1C 5.7 (H) 03/25/2022   HGBA1C 5.2 10/01/2021   HGBA1C 5.5 04/15/2021   HGBA1C 5.5 11/13/2020   Lab Results  Component Value Date   INSULIN 6.4 12/02/2022   INSULIN 5.2 03/25/2022   INSULIN 6.1 10/01/2021   INSULIN 11.2 04/03/2019   Lab Results  Component Value Date   TSH 0.575 04/03/2019   Lab Results  Component Value Date   CHOL 161 04/15/2021   HDL 55 04/15/2021   LDLCALC 91 04/15/2021   TRIG 80 04/15/2021   Lab Results  Component Value Date   VD25OH 58.8 12/02/2022   VD25OH 39.3 03/25/2022   VD25OH 49.1 10/01/2021   Lab Results  Component Value Date   WBC 6.5 04/15/2021   HGB 13.0 04/15/2021   HCT 39  04/15/2021   MCV 86 06/24/2020   PLT 279 04/15/2021   Lab Results  Component Value Date   IRON 162 04/15/2021   TIBC 252 06/24/2020   FERRITIN 64 06/24/2020   Attestation Statements:   Reviewed by clinician on day of visit: allergies, medications, problem list, medical history, surgical history, family history, social history, and previous encounter notes.  I have reviewed the above documentation for accuracy and completeness, and I agree with the above. -  Lenore Moyano d. Jarica Plass, NP-C

## 2023-02-08 ENCOUNTER — Other Ambulatory Visit: Payer: Self-pay | Admitting: Hematology and Oncology

## 2023-02-28 ENCOUNTER — Ambulatory Visit (INDEPENDENT_AMBULATORY_CARE_PROVIDER_SITE_OTHER): Payer: BC Managed Care – PPO | Admitting: Adult Health

## 2023-02-28 ENCOUNTER — Other Ambulatory Visit (HOSPITAL_COMMUNITY): Payer: Self-pay

## 2023-02-28 ENCOUNTER — Encounter (INDEPENDENT_AMBULATORY_CARE_PROVIDER_SITE_OTHER): Payer: Self-pay | Admitting: Adult Health

## 2023-02-28 VITALS — BP 139/80 | HR 72 | Temp 98.2°F | Ht 62.0 in | Wt 198.0 lb

## 2023-02-28 DIAGNOSIS — E66813 Obesity, class 3: Secondary | ICD-10-CM

## 2023-02-28 DIAGNOSIS — I152 Hypertension secondary to endocrine disorders: Secondary | ICD-10-CM

## 2023-02-28 DIAGNOSIS — Z7985 Long-term (current) use of injectable non-insulin antidiabetic drugs: Secondary | ICD-10-CM

## 2023-02-28 DIAGNOSIS — E669 Obesity, unspecified: Secondary | ICD-10-CM

## 2023-02-28 DIAGNOSIS — Z6836 Body mass index (BMI) 36.0-36.9, adult: Secondary | ICD-10-CM

## 2023-02-28 DIAGNOSIS — E559 Vitamin D deficiency, unspecified: Secondary | ICD-10-CM | POA: Diagnosis not present

## 2023-02-28 DIAGNOSIS — Z6841 Body Mass Index (BMI) 40.0 and over, adult: Secondary | ICD-10-CM

## 2023-02-28 DIAGNOSIS — E1159 Type 2 diabetes mellitus with other circulatory complications: Secondary | ICD-10-CM

## 2023-02-28 DIAGNOSIS — E1169 Type 2 diabetes mellitus with other specified complication: Secondary | ICD-10-CM | POA: Diagnosis not present

## 2023-02-28 MED ORDER — SEMAGLUTIDE (2 MG/DOSE) 8 MG/3ML ~~LOC~~ SOPN
2.0000 mg | PEN_INJECTOR | SUBCUTANEOUS | 0 refills | Status: DC
  Filled 2023-02-28: qty 9, 84d supply, fill #0

## 2023-02-28 MED ORDER — VITAMIN D (ERGOCALCIFEROL) 1.25 MG (50000 UNIT) PO CAPS
50000.0000 [IU] | ORAL_CAPSULE | ORAL | 0 refills | Status: DC
  Filled 2023-02-28: qty 4, 28d supply, fill #0

## 2023-02-28 NOTE — Progress Notes (Signed)
WEIGHT SUMMARY AND BIOMETRICS  Vitals Temp: 98.2 F (36.8 C) BP: 139/80 Pulse Rate: 72 SpO2: 99 %   Anthropometric Measurements Height: 5\' 2"  (1.575 m) Weight: 198 lb (89.8 kg) BMI (Calculated): 36.21 Weight at Last Visit: 199lb Weight Lost Since Last Visit: 1lb Weight Gained Since Last Visit: 0 Starting Weight: 231lb Total Weight Loss (lbs): 33 lb (15 kg)   Body Composition  Body Fat %: 48.2 % Fat Mass (lbs): 95.8 lbs Muscle Mass (lbs): 97.8 lbs Total Body Water (lbs): 77.8 lbs Visceral Fat Rating : 14   Other Clinical Data Fasting: no Labs: no Today's Visit #: 56 Starting Date: 04/03/19    Chief Complaint:   OBESITY Katrina Beard is here to discuss her progress with her obesity treatment plan.  She is on the practicing portion control and making smarter food choices, such as increasing vegetables and decreasing simple carbohydrates and states she is following her eating plan approximately 0 % of the time.  She states she is not currently exercising- bilateral knee pain   Interim History:  She has one foster child,age 9 She is enrolled at [employer]- grade 8  Exercise-unable to exercise due to bilateral knee pain L knee >R Knee discomfort L knee pain rated 10/10 R knee pain rated 6/10  Hydration-she estimates to drink at least 40 oz water/day  Subjective:   1. Type 2 diabetes mellitus with other specified complication, without long-term current use of insulin (HCC) Lab Results  Component Value Date   HGBA1C 6.0 (H) 12/02/2022   HGBA1C 5.7 (H) 03/25/2022   HGBA1C 5.2 10/01/2021    She has not been checking home CBG She denies sx's of hypoglycemia  She is currently on aspirin EC 81 MG tablet  atorvastatin (LIPITOR) 20 MG tablet  valsartan (DIOVAN) 320 MG tablet  Semaglutide, 2 MG/DOSE, 8 MG/3ML SOPN   Denies mass in neck, dysphagia, dyspepsia, persistent hoarseness, abdominal pain, or N/V/C   2. Hypertension associated with  type 2 diabetes mellitus (HCC) BP slightly elevated at OV She denies PC or dyspnea She is currently on: valsartan (DIOVAN) 320 MG tablet  amLODipine (NORVASC) 10 MG tablet   She denies tobacco/vape use  3. Vitamin D deficiency  Latest Reference Range & Units 12/02/22 12:31  Vitamin D, 25-Hydroxy 30.0 - 100.0 ng/mL 58.8  Vitamin B12 232 - 1,245 pg/mL 1,735 (H)  (H): Data is abnormally high  She endorses increases fatigue She is on weekly Ergocalciferol- denies N/V/Muscle Weakness  Assessment/Plan:   1. Type 2 diabetes mellitus with other specified complication, without long-term current use of insulin (HCC) (Primary) Refill  Semaglutide, 2 MG/DOSE, 8 MG/3ML SOPN Inject 2 mg into the skin once a week. Dispense: 9 mL, Refills: 0 of 0 remaining   2. Hypertension associated with type 2 diabetes mellitus (HCC) Reduce Na+ in diet Increase water intake Recommend monitor readings at home  3. Vitamin D deficiency Refill - Vitamin D, Ergocalciferol, (DRISDOL) 1.25 MG (50000 UNIT) CAPS capsule; Take 1 capsule (50,000 Units total) by mouth every 7 (seven) days.  Dispense: 4 capsule; Refill: 0  4. Current BMI 36.21  Katrina Beard is not currently in the action stage of change. As such, her goal is to get back to weightloss efforts . She has agreed to practicing portion control and making smarter food choices, such as increasing vegetables and decreasing simple carbohydrates.   Exercise goals: No exercise has been prescribed at this time.  Behavioral modification strategies: increasing lean protein intake,  decreasing simple carbohydrates, increasing vegetables, increasing water intake, meal planning and cooking strategies, keeping healthy foods in the home, ways to avoid boredom eating, ways to avoid night time snacking, and planning for success.  Katrina Beard has agreed to follow-up with our clinic in 4 weeks. She was informed of the importance of frequent follow-up visits to maximize her success with  intensive lifestyle modifications for her multiple health conditions.   Check Fasting Labs at next OV  Objective:   Blood pressure 139/80, pulse 72, temperature 98.2 F (36.8 C), height 5\' 2"  (1.575 m), weight 198 lb (89.8 kg), SpO2 99%. Body mass index is 36.21 kg/m.  General: Cooperative, alert, well developed, in no acute distress. HEENT: Conjunctivae and lids unremarkable. Cardiovascular: Regular rhythm.  Lungs: Normal work of breathing. Neurologic: No focal deficits.   Lab Results  Component Value Date   CREATININE 0.90 12/02/2022   BUN 16 12/02/2022   NA 144 12/02/2022   K 3.6 12/02/2022   CL 104 12/02/2022   CO2 24 12/02/2022   Lab Results  Component Value Date   ALT 25 12/02/2022   AST 21 12/02/2022   ALKPHOS 96 12/02/2022   BILITOT 0.5 12/02/2022   Lab Results  Component Value Date   HGBA1C 6.0 (H) 12/02/2022   HGBA1C 5.7 (H) 03/25/2022   HGBA1C 5.2 10/01/2021   HGBA1C 5.5 04/15/2021   HGBA1C 5.5 11/13/2020   Lab Results  Component Value Date   INSULIN 6.4 12/02/2022   INSULIN 5.2 03/25/2022   INSULIN 6.1 10/01/2021   INSULIN 11.2 04/03/2019   Lab Results  Component Value Date   TSH 0.575 04/03/2019   Lab Results  Component Value Date   CHOL 161 04/15/2021   HDL 55 04/15/2021   LDLCALC 91 04/15/2021   TRIG 80 04/15/2021   Lab Results  Component Value Date   VD25OH 58.8 12/02/2022   VD25OH 39.3 03/25/2022   VD25OH 49.1 10/01/2021   Lab Results  Component Value Date   WBC 6.5 04/15/2021   HGB 13.0 04/15/2021   HCT 39 04/15/2021   MCV 86 06/24/2020   PLT 279 04/15/2021   Lab Results  Component Value Date   IRON 162 04/15/2021   TIBC 252 06/24/2020   FERRITIN 64 06/24/2020   Attestation Statements:   Reviewed by clinician on day of visit: allergies, medications, problem list, medical history, surgical history, family history, social history, and previous encounter notes.  I have reviewed the above documentation for accuracy and  completeness, and I agree with the above. -  Katrina Beard d. Fama Muenchow, NP-C

## 2023-03-11 ENCOUNTER — Other Ambulatory Visit: Payer: Self-pay | Admitting: Hematology and Oncology

## 2023-04-04 ENCOUNTER — Ambulatory Visit (INDEPENDENT_AMBULATORY_CARE_PROVIDER_SITE_OTHER): Payer: 59 | Admitting: Adult Health

## 2023-04-07 ENCOUNTER — Other Ambulatory Visit: Payer: Self-pay | Admitting: Hematology and Oncology

## 2023-04-11 ENCOUNTER — Other Ambulatory Visit (HOSPITAL_COMMUNITY): Payer: Self-pay

## 2023-04-11 ENCOUNTER — Encounter (INDEPENDENT_AMBULATORY_CARE_PROVIDER_SITE_OTHER): Payer: Self-pay | Admitting: Adult Health

## 2023-04-11 ENCOUNTER — Ambulatory Visit (INDEPENDENT_AMBULATORY_CARE_PROVIDER_SITE_OTHER): Payer: 59 | Admitting: Adult Health

## 2023-04-11 VITALS — BP 166/71 | HR 69 | Temp 97.8°F | Ht 62.0 in | Wt 197.0 lb

## 2023-04-11 DIAGNOSIS — E559 Vitamin D deficiency, unspecified: Secondary | ICD-10-CM

## 2023-04-11 DIAGNOSIS — E66813 Obesity, class 3: Secondary | ICD-10-CM

## 2023-04-11 DIAGNOSIS — Z7985 Long-term (current) use of injectable non-insulin antidiabetic drugs: Secondary | ICD-10-CM

## 2023-04-11 DIAGNOSIS — E1159 Type 2 diabetes mellitus with other circulatory complications: Secondary | ICD-10-CM

## 2023-04-11 DIAGNOSIS — E1169 Type 2 diabetes mellitus with other specified complication: Secondary | ICD-10-CM | POA: Diagnosis not present

## 2023-04-11 DIAGNOSIS — I152 Hypertension secondary to endocrine disorders: Secondary | ICD-10-CM

## 2023-04-11 DIAGNOSIS — Z6836 Body mass index (BMI) 36.0-36.9, adult: Secondary | ICD-10-CM

## 2023-04-11 DIAGNOSIS — E669 Obesity, unspecified: Secondary | ICD-10-CM

## 2023-04-11 MED ORDER — VITAMIN D (ERGOCALCIFEROL) 1.25 MG (50000 UNIT) PO CAPS
50000.0000 [IU] | ORAL_CAPSULE | ORAL | 0 refills | Status: DC
  Filled 2023-04-11: qty 4, 28d supply, fill #0

## 2023-04-11 NOTE — Progress Notes (Signed)
WEIGHT SUMMARY AND BIOMETRICS  Vitals Temp: 97.8 F (36.6 C) BP: (!) 166/71 Pulse Rate: 69 SpO2: 99 %   Anthropometric Measurements Height: 5\' 2"  (1.575 m) Weight: 197 lb (89.4 kg) BMI (Calculated): 36.02 Weight at Last Visit: 198 lb Weight Lost Since Last Visit: 1 lb Weight Gained Since Last Visit: 0 Starting Weight: 318 lb Total Weight Loss (lbs): 34 lb (15.4 kg)   Body Composition  Body Fat %: 47 % Fat Mass (lbs): 92.6 lbs Muscle Mass (lbs): 99.2 lbs Total Body Water (lbs): 74.8 lbs Visceral Fat Rating : 14   Other Clinical Data Fasting: no Labs: no Today's Visit #: 58 Starting Date: 04/03/19    Chief Complaint:   OBESITY Katrina Beard is here to discuss her progress with her obesity treatment plan. She is on the practicing portion control and making smarter food choices, such as increasing vegetables and decreasing simple carbohydrates and states she is following her eating plan approximately 60 % of the time. She states she is exercising: None.   Interim History:  Katrina Beard endorses chronic L knee pain. Her current BMI 36.0- acceptable for total L knee replacement  Katrina Beard currently has one foster child at home- 9 year old girl.  Reviewed Bioimpedance results with pt: Muscle Mass: +1.4 lbs Adipose Mass: -3.2 lbs  Subjective:   1. Vitamin D deficiency  Latest Reference Range & Units 12/02/22 12:31  Vitamin D, 25-Hydroxy 30.0 - 100.0 ng/mL 58.8   She is on weekly Ergocalciferol-denies N/V/Muscle Weakness  2. Hypertension associated with type 2 diabetes mellitus (HCC) BP not well controlled at OV today. Antihypertensive therapy managed by PCP/Dr. Paulino Rily valsartan (DIOVAN) 320 MG tablet  amLODipine (NORVASC) 10 MG tablet   She denies acute cardiac sx's at present She denies tobacco/vape use  3. Type 2 diabetes mellitus with other specified complication, without long-term current use of insulin (HCC) Lab Results  Component Value Date    HGBA1C 6.0 (H) 12/02/2022   HGBA1C 5.7 (H) 03/25/2022   HGBA1C 5.2 10/01/2021     Latest Reference Range & Units 12/02/22 12:31  Glucose 70 - 99 mg/dL 81  Hemoglobin Z6X 4.8 - 5.6 % 6.0 (H)  Est. average glucose Bld gHb Est-mCnc mg/dL 096  INSULIN 2.6 - 04.5 uIU/mL 6.4  (H): Data is abnormally high  She has not been checking home CBG She denies sx's of hypoglycemia She is currently on weekly Ozempic 2mg  Denies mass in neck, dysphagia, dyspepsia, persistent hoarseness, abdominal pain, or N/V/C  She endorses stable appetite levels.  Assessment/Plan:   1. Vitamin D deficiency Check Labs and Refill - VITAMIN D 25 Hydroxy (Vit-D Deficiency, Fractures) - Vitamin D, Ergocalciferol, (DRISDOL) 1.25 MG (50000 UNIT) CAPS capsule; Take 1 capsule (50,000 Units total) by mouth every 7 (seven) days.  Dispense: 4 capsule; Refill: 0  2. Hypertension associated with type 2 diabetes mellitus (HCC) (Primary) Check Labs - Comprehensive metabolic panel F/u with PCP, re: Amlodipine dose. BP not well controlled at OV today.  3. Type 2 diabetes mellitus with other specified complication, without long-term current use of insulin (HCC) Check Labs - Hemoglobin A1c - Insulin, random  4. Current BMI 36  Katrina Beard is currently in the action stage of change. As such, her goal is to continue with weight loss efforts. She has agreed to practicing portion control and making smarter food choices, such as increasing vegetables and decreasing simple carbohydrates.   Exercise goals: All adults should avoid inactivity. Some physical activity is  better than none, and adults who participate in any amount of physical activity gain some health benefits. Adults should also include muscle-strengthening activities that involve all major muscle groups on 2 or more days a week.  Behavioral modification strategies: increasing lean protein intake, decreasing simple carbohydrates, increasing vegetables, increasing water intake, no  skipping meals, meal planning and cooking strategies, keeping healthy foods in the home, ways to avoid boredom eating, and planning for success.  Katrina Beard has agreed to follow-up with our clinic in 4 weeks. She was informed of the importance of frequent follow-up visits to maximize her success with intensive lifestyle modifications for her multiple health conditions.   Objective:   Blood pressure (!) 166/71, pulse 69, temperature 97.8 F (36.6 C), height 5\' 2"  (1.575 m), weight 197 lb (89.4 kg), SpO2 99%. Body mass index is 36.03 kg/m.  General: Cooperative, alert, well developed, in no acute distress. HEENT: Conjunctivae and lids unremarkable. Cardiovascular: Regular rhythm.  Lungs: Normal work of breathing. Neurologic: No focal deficits.   Lab Results  Component Value Date   CREATININE 0.90 12/02/2022   BUN 16 12/02/2022   NA 144 12/02/2022   K 3.6 12/02/2022   CL 104 12/02/2022   CO2 24 12/02/2022   Lab Results  Component Value Date   ALT 25 12/02/2022   AST 21 12/02/2022   ALKPHOS 96 12/02/2022   BILITOT 0.5 12/02/2022   Lab Results  Component Value Date   HGBA1C 6.0 (H) 12/02/2022   HGBA1C 5.7 (H) 03/25/2022   HGBA1C 5.2 10/01/2021   HGBA1C 5.5 04/15/2021   HGBA1C 5.5 11/13/2020   Lab Results  Component Value Date   INSULIN 6.4 12/02/2022   INSULIN 5.2 03/25/2022   INSULIN 6.1 10/01/2021   INSULIN 11.2 04/03/2019   Lab Results  Component Value Date   TSH 0.575 04/03/2019   Lab Results  Component Value Date   CHOL 161 04/15/2021   HDL 55 04/15/2021   LDLCALC 91 04/15/2021   TRIG 80 04/15/2021   Lab Results  Component Value Date   VD25OH 58.8 12/02/2022   VD25OH 39.3 03/25/2022   VD25OH 49.1 10/01/2021   Lab Results  Component Value Date   WBC 6.5 04/15/2021   HGB 13.0 04/15/2021   HCT 39 04/15/2021   MCV 86 06/24/2020   PLT 279 04/15/2021   Lab Results  Component Value Date   IRON 162 04/15/2021   TIBC 252 06/24/2020   FERRITIN 64  06/24/2020    Attestation Statements:   Reviewed by clinician on day of visit: allergies, medications, problem list, medical history, surgical history, family history, social history, and previous encounter notes.  I have reviewed the above documentation for accuracy and completeness, and I agree with the above. -  Zacharia Sowles d. Rue Tinnel, NP-C

## 2023-04-12 LAB — COMPREHENSIVE METABOLIC PANEL
ALT: 32 [IU]/L (ref 0–32)
AST: 24 [IU]/L (ref 0–40)
Albumin: 4 g/dL (ref 3.8–4.9)
Alkaline Phosphatase: 116 [IU]/L (ref 44–121)
BUN/Creatinine Ratio: 15 (ref 12–28)
BUN: 14 mg/dL (ref 8–27)
Bilirubin Total: 0.5 mg/dL (ref 0.0–1.2)
CO2: 23 mmol/L (ref 20–29)
Calcium: 9.1 mg/dL (ref 8.7–10.3)
Chloride: 102 mmol/L (ref 96–106)
Creatinine, Ser: 0.92 mg/dL (ref 0.57–1.00)
Globulin, Total: 2.8 g/dL (ref 1.5–4.5)
Glucose: 75 mg/dL (ref 70–99)
Potassium: 3.7 mmol/L (ref 3.5–5.2)
Sodium: 141 mmol/L (ref 134–144)
Total Protein: 6.8 g/dL (ref 6.0–8.5)
eGFR: 71 mL/min/{1.73_m2} (ref >59)

## 2023-04-12 LAB — HEMOGLOBIN A1C
Est. average glucose Bld gHb Est-mCnc: 123 mg/dL
Hgb A1c MFr Bld: 5.9 % — ABNORMAL HIGH (ref 4.8–5.6)

## 2023-04-12 LAB — INSULIN, RANDOM: INSULIN: 5.5 u[IU]/mL (ref 2.6–24.9)

## 2023-04-12 LAB — VITAMIN D 25 HYDROXY (VIT D DEFICIENCY, FRACTURES): Vit D, 25-Hydroxy: 50.7 ng/mL (ref 30.0–100.0)

## 2023-04-14 ENCOUNTER — Ambulatory Visit
Admission: RE | Admit: 2023-04-14 | Discharge: 2023-04-14 | Disposition: A | Discharge status: Home / Self Care | Payer: 59 | Source: Ambulatory Visit | Attending: Hematology and Oncology | Admitting: Hematology and Oncology

## 2023-04-14 DIAGNOSIS — C50412 Malignant neoplasm of upper-outer quadrant of left female breast: Secondary | ICD-10-CM

## 2023-04-15 ENCOUNTER — Other Ambulatory Visit: Payer: Self-pay | Admitting: Hematology and Oncology

## 2023-04-15 DIAGNOSIS — R921 Mammographic calcification found on diagnostic imaging of breast: Secondary | ICD-10-CM

## 2023-04-20 ENCOUNTER — Ambulatory Visit
Admission: RE | Admit: 2023-04-20 | Discharge: 2023-04-20 | Disposition: A | Discharge status: Home / Self Care | Payer: 59 | Source: Ambulatory Visit | Attending: Hematology and Oncology | Admitting: Hematology and Oncology

## 2023-04-20 DIAGNOSIS — R921 Mammographic calcification found on diagnostic imaging of breast: Secondary | ICD-10-CM

## 2023-04-20 HISTORY — PX: BREAST BIOPSY: SHX20

## 2023-04-21 LAB — SURGICAL PATHOLOGY

## 2023-05-05 ENCOUNTER — Telehealth: Payer: Self-pay

## 2023-05-05 NOTE — Telephone Encounter (Signed)
Spoke with patient and confirmed visit on 05/06/2023

## 2023-05-06 ENCOUNTER — Inpatient Hospital Stay: Payer: 59 | Attending: Hematology and Oncology | Admitting: Hematology and Oncology

## 2023-05-06 VITALS — BP 141/58 | HR 79 | Temp 97.3°F | Resp 16 | Wt 203.6 lb

## 2023-05-06 DIAGNOSIS — Z803 Family history of malignant neoplasm of breast: Secondary | ICD-10-CM | POA: Insufficient documentation

## 2023-05-06 DIAGNOSIS — E119 Type 2 diabetes mellitus without complications: Secondary | ICD-10-CM | POA: Insufficient documentation

## 2023-05-06 DIAGNOSIS — Z1721 Progesterone receptor positive status: Secondary | ICD-10-CM | POA: Diagnosis not present

## 2023-05-06 DIAGNOSIS — R635 Abnormal weight gain: Secondary | ICD-10-CM | POA: Insufficient documentation

## 2023-05-06 DIAGNOSIS — D0512 Intraductal carcinoma in situ of left breast: Secondary | ICD-10-CM | POA: Insufficient documentation

## 2023-05-06 DIAGNOSIS — I2510 Atherosclerotic heart disease of native coronary artery without angina pectoris: Secondary | ICD-10-CM | POA: Insufficient documentation

## 2023-05-06 DIAGNOSIS — C50412 Malignant neoplasm of upper-outer quadrant of left female breast: Secondary | ICD-10-CM | POA: Diagnosis not present

## 2023-05-06 DIAGNOSIS — Z79899 Other long term (current) drug therapy: Secondary | ICD-10-CM | POA: Insufficient documentation

## 2023-05-06 DIAGNOSIS — Z170 Estrogen receptor positive status [ER+]: Secondary | ICD-10-CM | POA: Diagnosis not present

## 2023-05-06 NOTE — Progress Notes (Signed)
BRIEF ONCOLOGIC HISTORY:   Oncology History  Malignant neoplasm of upper-outer quadrant of left breast in female, estrogen receptor positive (HCC)  05/15/2021 Initial Diagnosis   Left breast UOQ biopsy: DCIS with calcifications in the upper outer anterior calcifications.  ER 100%/PR70% Upper outer posterior biopsy: complex sclerosing lesion with atypia and calcifications.      06/11/2021 Surgery   Left lumpectomy: DCIS 1.5cm, margins negative, CSL with intraductal papilloma noted.   06/11/2021 Cancer Staging   Staging form: Breast, AJCC 8th Edition - Pathologic stage from 06/11/2021: Stage 0 (pTis (DCIS), pN0, cM0, ER+, PR+) - Signed by Loa Socks, NP on 10/01/2021   06/15/2021 Genetic Testing   CustomNext+RNA cancer panel found no pathogenic mutations.    The CustomNext-Cancer+RNAinsight panel offered by [facility] includes sequencing and rearrangement analysis for the following 47 genes:  APC, ATM, AXIN2, BARD1, BMPR1A, BRCA1, BRCA2, BRIP1, CDH1, CDK4, CDKN2A, CHEK2, DICER1, EPCAM, GREM1, HOXB13, MEN1, MLH1, MSH2, MSH3, MSH6, MUTYH, NBN, NF1, NF2, NTHL1, PALB2, PMS2, POLD1, POLE, PTEN, RAD51C, RAD51D, RECQL, RET, SDHA, SDHAF2, SDHB, SDHC, SDHD, SMAD4, SMARCA4, STK11, TP53, TSC1, TSC2, and VHL.  RNA data is routinely analyzed for use in variant interpretation for all genes.    07/16/2021 - 08/14/2021 Radiation Therapy   Site Technique Total Dose (Gy) Dose per Fx (Gy) Completed Fx Beam Energies  Breast, Left: Breast_L 3D 42.56/42.56 2.66 16/16 10X  Breast, Left: Breast_L_Bst 3D 8/8 2 4/4 10X     08/2021 -  Anti-estrogen oral therapy   Tamoxifen daily    INTERVAL HISTORY:   Discussed the use of AI scribe software for clinical note transcription with the patient, who gave verbal consent to proceed.  History of Present Illness    Katrina Beard is a 63 year old female with breast cancer who presents for follow-up regarding weight management and medication side effects.  She  had a mammogram in January, which led to a biopsy showing benign fibroadenomatous changes.  She has been experiencing difficulty with weight management over the past several months. Initially, she was on [facility], which led to weight gain, reaching up to 200 pounds, making her clothes tight. She was then switched back to Ozempic, which helped reduce her weight to 198 pounds. However, she feels her weight loss has plateaued despite exercising twice a week for 20 minutes and maintaining the same eating habits. She describes the experience as 'very depressing.  She has tried various dietary supplements, including protein shakes and Celsius drinks, which she felt helped with weight loss in the past. She continues to consume protein-rich foods like eggs but feels these efforts are not currently effective.  She is taking tamoxifen as prescribed. She denies any side effects with tamoxifen.   REVIEW OF SYSTEMS:  Review of Systems  Constitutional:  Negative for appetite change, chills, fatigue, fever and unexpected weight change.  HENT:   Negative for hearing loss, lump/mass and trouble swallowing.   Eyes:  Negative for eye problems and icterus.  Respiratory:  Negative for chest tightness, cough and shortness of breath.   Cardiovascular:  Negative for chest pain, leg swelling and palpitations.  Gastrointestinal:  Negative for abdominal distention, abdominal pain, constipation, diarrhea, nausea and vomiting.  Endocrine: Negative for hot flashes.  Genitourinary:  Negative for difficulty urinating.   Musculoskeletal:  Negative for arthralgias.  Skin:  Negative for itching and rash.  Neurological:  Negative for dizziness, extremity weakness, headaches and numbness.  Hematological:  Negative for adenopathy. Does not bruise/bleed easily.  Psychiatric/Behavioral:  Negative for depression. The patient is not nervous/anxious.    ONCOLOGY TREATMENT TEAM:  1. Surgeon:  Dr. Luisa Hart at [facility] 2. Medical Oncologist: Dr. Al Pimple 3. Radiation Oncologist: Dr. Mitzi Hansen    PAST MEDICAL/SURGICAL HISTORY:  Past Medical History:  Diagnosis Date   Asthma    Breast cancer (HCC)    CAD (coronary artery disease)    CHF (congestive heart failure) (HCC)    Diabetes mellitus    Gallbladder problem    GERD (gastroesophageal reflux disease)    HLD (hyperlipidemia)    Hypertension    Joint pain    Kidney stone    Knee pain    MI, old 2003   no stent placement   Personal history of radiation therapy    Vitamin D deficiency    Past Surgical History:  Procedure Laterality Date   BREAST BIOPSY Left 05/15/2021   x2   BREAST BIOPSY Left 04/19/2022   MM LT BREAST BX W LOC DEV 1ST LESION IMAGE BX SPEC STEREO GUIDE 04/19/2022 [facility]   BREAST BIOPSY  06/21/2022   MM LT RADIOACTIVE SEED LOC MAMMO GUIDE 06/21/2022 [facility]   BREAST BIOPSY Left 04/20/2023   MM LT BREAST BX W LOC DEV 1ST LESION IMAGE BX SPEC STEREO GUIDE 04/20/2023 [facility]   BREAST EXCISIONAL BIOPSY Bilateral    BREAST EXCISIONAL BIOPSY Left 06/11/2021   BREAST LUMPECTOMY Left 06/11/2021   BREAST LUMPECTOMY WITH RADIOACTIVE SEED LOCALIZATION Left 06/11/2021   Procedure: LEFT BREAST LUMPECTOMY WITH RADIOACTIVE SEED LOCALIZATION X2;  Surgeon: Harriette Bouillon, MD;  Location: [facility];  Service: General;  Laterality: Left;   BREAST LUMPECTOMY WITH RADIOACTIVE SEED LOCALIZATION Left 06/22/2022   Procedure: LEFT BREAST LUMPECTOMY WITH RADIOACTIVE SEED LOCALIZATION;  Surgeon: Harriette Bouillon, MD;  Location: [facility];  Service: General;  Laterality: Left;   BREAST SURGERY     CHOLECYSTECTOMY     CYSTOSCOPY W/ URETERAL STENT PLACEMENT Right 07/25/2016   Procedure: CYSTOSCOPY WITH RETROGRADE PYELOGRAM/LEFT URETERAL STENT PLACEMENT;  Surgeon: Crist Fat, MD;  Location: [facility];  Service: Urology;  Laterality: Right;   HAND SURGERY     ROUX-EN-Y GASTRIC BYPASS      07/15/2014     ALLERGIES:  Allergies  Allergen Reactions   Bisoprolol Fumarate Other (See Comments)   Hydrochlorothiazide     Other Reaction(s): low K   Ibuprofen Other (See Comments)    Gastric bypass surgery-can't take anymore   Liraglutide Other (See Comments)   Lisinopril-Hydrochlorothiazide Other (See Comments)    Arm numbness and tongue blisters     CURRENT MEDICATIONS:  Outpatient Encounter Medications as of 05/06/2023  Medication Sig   ALBUTEROL IN Inhale into the lungs.   amLODipine (NORVASC) 10 MG tablet 1 tab by mouth daily. LAST REFILL FROM [facility]- NEX FROM PCP/DR. WOLTERS (Patient taking differently: Take 2.5 mg by mouth daily. 1 tab by mouth daily. LAST REFILL FROM [facility]- NEX FROM PCP/DR. WOLTERS)   aspirin EC 81 MG tablet Take 81 mg by mouth daily.   atorvastatin (LIPITOR) 20 MG tablet Take 20 mg by mouth daily.   blood glucose meter kit and supplies KIT Dispense based on patient and insurance preference. Use up to four times daily as directed.   ferrous sulfate (FERROUSUL) 325 (65 FE) MG tablet Take 1 tablet (325 mg total) by mouth daily with breakfast.   fluticasone (FLONASE) 50 MCG/ACT nasal spray Place 1 spray into both nostrils in the morning  and at bedtime.   Multiple Vitamins-Iron (MULTIVITAMIN/IRON PO) Take by mouth.   pantoprazole (PROTONIX) 40 MG tablet Take 1 tablet (40 mg total) by mouth daily.   Semaglutide, 2 MG/DOSE, 8 MG/3ML SOPN Inject 2 mg into the skin once a week.   tamoxifen (NOLVADEX) 20 MG tablet Take 1 tablet by mouth once daily   valsartan (DIOVAN) 320 MG tablet Take 320 mg by mouth daily.   Vitamin D, Ergocalciferol, (DRISDOL) 1.25 MG (50000 UNIT) CAPS capsule Take 1 capsule (50,000 Units total) by mouth every 7 (seven) days.   No facility-administered encounter medications on file as of 05/06/2023.     ONCOLOGIC FAMILY HISTORY:  Family History  Problem Relation Age of Onset   Breast cancer Mother 77   Hypertension Mother    Hyperlipidemia  Mother    Obesity Mother    Hypertension Father    Heart disease Father      SOCIAL HISTORY:  Social History   Socioeconomic History   Marital status: Single    Spouse name: Not on file   Number of children: Not on file   Years of education: Not on file   Highest education level: Not on file  Occupational History   Occupation: Surveyor, mining  Tobacco Use   Smoking status: Never   Smokeless tobacco: Never  Vaping Use   Vaping status: Never Used  Substance and Sexual Activity   Alcohol use: Yes    Comment: occassional   Drug use: No   Sexual activity: Not on file  Other Topics Concern   Not on file  Social History Narrative   Not on file   Social Drivers of Health   Financial Resource Strain: Not on file  Food Insecurity: Not on file  Transportation Needs: Not on file  Physical Activity: Not on file  Stress: Not on file  Social Connections: Not on file  Intimate Partner Violence: Not on file     OBSERVATIONS/OBJECTIVE:  BP (!) 141/58 (BP Location: Left Arm, Patient Position: Sitting)   Pulse 79   Temp (!) 97.3 F (36.3 C) (Temporal)   Resp 16   Wt 203 lb 9.6 oz (92.4 kg)   SpO2 100%   BMI 37.24 kg/m   GENERAL: Patient is a well appearing female in no acute distress NODES:  No cervical, supraclavicular, or axillary lymphadenopathy palpated.  BREAST EXAM:  left breast s/p lumpectomy and radiation, no sign of local recurrence, very large seroma noted in left breast, right breast benign LUNGS:  Clear to auscultation bilaterally.  No wheezes or rhonchi. HEART:  Regular rate and rhythm. No murmur appreciated. EXTREMITIES:  + left leg edema. SKIN:  Clear with no obvious rashes or skin changes. No nail dyscrasia. NEURO:  Nonfocal. Well oriented.  Appropriate affect.   LABORATORY DATA:  None for this visit.  DIAGNOSTIC IMAGING:  None for this visit.   ASSESSMENT AND PLAN:   Ms.. Herard is a pleasant 63 y.o. female with Stage 0 left DCIS, ER+/PR+,  diagnosed in 04/2021, treated with lumpectomy, adjuvant radiation therapy, and anti-estrogen therapy with Tamoxifen beginning in 08/2021.  She remains compliant with tamoxifen and is here for follow-up  Malignant neoplasm of upper-outer quadrant of left breast in female, estrogen receptor positive (HCC) This is a very pleasant 63 year old female patient with past medical history significant for coronary artery disease, diabetes, obesity and DCIS now on adj tamoxifen who is here for follow up.  Breast Cancer On Tamoxifen with no reported side effects.  No new lumps or changes on physical exam. Mammogram and biopsy in January 2025 were benign. -Continue Tamoxifen as prescribed. -Next follow-up in six months (August 2025).  Weight Management Patient reports weight gain while on Mounjaro and subsequent weight loss on Ozempic. Currently struggling with further weight loss despite exercise and unchanged eating habits. -Encourage continued exercise and healthy eating habits. -Consider consultation with a nutritionist for further dietary guidance.  General Health Maintenance -Continue regular mammograms as advised by breast surgeon. -Continue monitoring liver enzymes due to Tamoxifen use.  The patient was provided an opportunity to ask questions and all were answered. The patient agreed with the plan and demonstrated an understanding of the instructions.   Total encounter time:30 minutes*in face-to-face visit time, chart review, lab review, care coordination, order entry, and documentation of the encounter time.  *Total Encounter Time as defined by the Centers for Medicare and Medicaid Services includes, in addition to the face-to-face time of a patient visit (documented in the note above) non-face-to-face time: obtaining and reviewing outside history, ordering and reviewing medications, tests or procedures, care coordination (communications with other health care professionals or caregivers) and  documentation in the medical record.

## 2023-05-06 NOTE — Assessment & Plan Note (Signed)
This is a very pleasant 63 year old female patient with past medical history significant for coronary artery disease, diabetes, obesity and DCIS now on adj tamoxifen who is here for follow up.  Breast Cancer On Tamoxifen with no reported side effects. No new lumps or changes on physical exam. Mammogram and biopsy in January 2025 were benign. -Continue Tamoxifen as prescribed. -Next follow-up in six months (August 2025).  Weight Management Patient reports weight gain while on Mounjaro and subsequent weight loss on Ozempic. Currently struggling with further weight loss despite exercise and unchanged eating habits. -Encourage continued exercise and healthy eating habits. -Consider consultation with a nutritionist for further dietary guidance.  General Health Maintenance -Continue regular mammograms as advised by breast surgeon. -Continue monitoring liver enzymes due to Tamoxifen use.

## 2023-05-07 ENCOUNTER — Other Ambulatory Visit: Payer: Self-pay | Admitting: Hematology and Oncology

## 2023-05-11 ENCOUNTER — Ambulatory Visit (INDEPENDENT_AMBULATORY_CARE_PROVIDER_SITE_OTHER): Payer: 59 | Admitting: Adult Health

## 2023-05-11 ENCOUNTER — Other Ambulatory Visit (HOSPITAL_COMMUNITY): Payer: Self-pay

## 2023-05-11 ENCOUNTER — Encounter (INDEPENDENT_AMBULATORY_CARE_PROVIDER_SITE_OTHER): Payer: Self-pay | Admitting: Adult Health

## 2023-05-11 VITALS — BP 126/79 | HR 76 | Temp 97.5°F | Ht 62.0 in | Wt 200.0 lb

## 2023-05-11 DIAGNOSIS — Z7985 Long-term (current) use of injectable non-insulin antidiabetic drugs: Secondary | ICD-10-CM

## 2023-05-11 DIAGNOSIS — E1159 Type 2 diabetes mellitus with other circulatory complications: Secondary | ICD-10-CM

## 2023-05-11 DIAGNOSIS — R112 Nausea with vomiting, unspecified: Secondary | ICD-10-CM | POA: Diagnosis not present

## 2023-05-11 DIAGNOSIS — E559 Vitamin D deficiency, unspecified: Secondary | ICD-10-CM

## 2023-05-11 DIAGNOSIS — Z6836 Body mass index (BMI) 36.0-36.9, adult: Secondary | ICD-10-CM

## 2023-05-11 DIAGNOSIS — E669 Obesity, unspecified: Secondary | ICD-10-CM

## 2023-05-11 DIAGNOSIS — E1169 Type 2 diabetes mellitus with other specified complication: Secondary | ICD-10-CM

## 2023-05-11 DIAGNOSIS — I152 Hypertension secondary to endocrine disorders: Secondary | ICD-10-CM

## 2023-05-11 MED ORDER — SEMAGLUTIDE (2 MG/DOSE) 8 MG/3ML ~~LOC~~ SOPN
2.0000 mg | PEN_INJECTOR | SUBCUTANEOUS | 0 refills | Status: DC
  Filled 2023-05-11: qty 9, 84d supply, fill #0

## 2023-05-11 MED ORDER — BD ASSURE BPM/DELUXE ARM CUFF MISC
1.0000 [IU] | Freq: Every day | 0 refills | Status: AC
  Filled 2023-05-11: qty 1, 90d supply, fill #0

## 2023-05-11 MED ORDER — VITAMIN D (ERGOCALCIFEROL) 1.25 MG (50000 UNIT) PO CAPS
50000.0000 [IU] | ORAL_CAPSULE | ORAL | 0 refills | Status: DC
  Filled 2023-05-11: qty 4, 28d supply, fill #0

## 2023-05-11 NOTE — Progress Notes (Addendum)
 WEIGHT SUMMARY AND BIOMETRICS  No data recorded Anthropometric Measurements Height: 5\' 2"  (1.575 m) Weight at Last Visit: 197lb Starting Weight: 318lb   No data recorded Other Clinical Data Labs: no Today's Visit #: 104 Starting Date: 04/03/19    Chief Complaint:   OBESITY Katrina Beard is here to discuss her progress with her obesity treatment plan.  She is on the practicing portion control and making smarter food choices, such as increasing vegetables and decreasing simple carbohydrates and states she is following her eating plan approximately 50 % of the time.  She states she is exercising Walking 20 minutes 3 times per week.   Interim History:  She has been on Wegovy, Ozempic, and Mounjaro  Lowest weight recorded with [facility]   06/23/21 12:00  Weight 179 lb (81.2 kg)  BMI (Calculated) 32.73   She is currently on weekly Ozempic 2mg    05/11/23 10:00  Weight 200 lb (90.7 kg)  BMI (Calculated) 36.57  Total Weight Loss (lbs) 31 lb (14.1 kg)   Reviewed Bioimpedance results with pt: Muscle Mass: +0.2 lb Adipose Mass: + 2.8 lbs  She is not utilizing her food scale, simply estimating protein servings  Of note- 11/01/2022 Mounjaro 10mg  d/c'd Started on weekly Ozempic 2mg  Conversion per pt request as she was gaining weight on Mounjaro and she felt that Ozempic "worked better".  Subjective:   1. Type 2 diabetes mellitus with other specified complication, without long-term current use of insulin (HCC) Discussed Labs  Latest Reference Range & Units 04/11/23 12:39  Glucose 70 - 99 mg/dL 75  Hemoglobin Z6X 4.8 - 5.6 % 5.9 (H)  Est. average glucose Bld gHb Est-mCnc mg/dL 096  INSULIN 2.6 - 04.5 uIU/mL 5.5  (H): Data is abnormally high  CBG, A1c, and Insulin Levels all improved and at goal. She has not been checking home CBG She denies sx's of hypoglycemia She is currently on weekly Ozempic 2mg  Denies mass in neck, dysphagia, dyspepsia, persistent hoarseness, abdominal  pain, or N/V/C  She endorses stable appetite levels.  Of note- 11/01/2022 Mounjaro 10mg  d/c'd Started on weekly Ozempic 2mg  Conversion per pt request as she was gaining weight on Mounjaro and she felt that Ozempic "worked better".  2. Nausea and vomiting, unspecified vomiting type She denies any current or recent GI upset. She denies her appetite being over on max dose of Ozempic 2mg   3. Vitamin D deficiency Discussed Labs  Latest Reference Range & Units 04/11/23 12:39  Vitamin D, 25-Hydroxy 30.0 - 100.0 ng/mL 50.7   Vit D Level at goal  4. Hypertension associated with type 2 diabetes mellitus (HCC) Discussed Labs 04/11/2023 CMP: Electrolytes, Kidney/Liver enzymes are stable 04/11/2023 BP at [facility] OV 166/71 She was directed to f/u with PCP whom is managing her antihypertensive therapy 04/14/2023 and 04/18/2023 Messages from PCP to pt- increased Amlodipine 2.5mg  to 5mg  (via 2 tabs of 2.5mg ).  She is on  aspirin EC 81 MG tablet  atorvastatin (LIPITOR) 20 MG tablet  valsartan (DIOVAN) 320 MG tablet  amLODipine (NORVASC) 5 MG tablet (2 x 2.5mg  tabs daily)   Assessment/Plan:   1. Type 2 diabetes mellitus with other specified complication, without long-term current use of insulin (HCC) Refill  Semaglutide, 2 MG/DOSE, 8 MG/3ML SOPN Inject 2 mg into the skin once a week. Dispense: 9 mL, Refills: 0 of 0 remaining   2. Nausea and vomiting, unspecified vomiting type Do not overeat Limit sugar/simple CHO  3. Vitamin D deficiency Refill  Vitamin D, Ergocalciferol, (  DRISDOL) 1.25 MG (50000 UNIT) CAPS capsule Take 1 capsule (50,000 Units total) by mouth every 7 (seven) days. Dispense: 4 capsule, Refills: 0 of 0 remaining   4. Hypertension associated with type 2 diabetes mellitus (HCC) Order- check BP at home and medical OVs Blood Pressure Monitoring (B-D ASSURE BPM/DELUXE ARM CUFF) MISC 1 Units by Does not apply route daily. Dispense: 1 each, Refills: 0 of 0 remaining   5. Current BMI  36.57  Katrina Beard is not currently in the action stage of change. As such, her goal is to get back to weightloss efforts . She has agreed to practicing portion control and making smarter food choices, such as increasing vegetables and decreasing simple carbohydrates.   Utilize food scale and strive for 4-6 oz meat protein per meal. Weight food AFTER cooking- demonstrated use of scale during OV.  Exercise goals: All adults should avoid inactivity. Some physical activity is better than none, and adults who participate in any amount of physical activity gain some health benefits. Adults should also include muscle-strengthening activities that involve all major muscle groups on 2 or more days a week.  Behavioral modification strategies: increasing lean protein intake, decreasing simple carbohydrates, increasing vegetables, increasing water intake, no skipping meals, meal planning and cooking strategies, keeping healthy foods in the home, ways to avoid boredom eating, and planning for success.  Katrina Beard has agreed to follow-up with our clinic in 4 weeks. She was informed of the importance of frequent follow-up visits to maximize her success with intensive lifestyle modifications for her multiple health conditions.   Objective:   Height 5\' 2"  (1.575 m). Body mass index is 37.24 kg/m.  General: Cooperative, alert, well developed, in no acute distress. HEENT: Conjunctivae and lids unremarkable. Cardiovascular: Regular rhythm.  Lungs: Normal work of breathing. Neurologic: No focal deficits.   Lab Results  Component Value Date   CREATININE 0.92 04/11/2023   BUN 14 04/11/2023   NA 141 04/11/2023   K 3.7 04/11/2023   CL 102 04/11/2023   CO2 23 04/11/2023   Lab Results  Component Value Date   ALT 32 04/11/2023   AST 24 04/11/2023   ALKPHOS 116 04/11/2023   BILITOT 0.5 04/11/2023   Lab Results  Component Value Date   HGBA1C 5.9 (H) 04/11/2023   HGBA1C 6.0 (H) 12/02/2022   HGBA1C 5.7 (H)  03/25/2022   HGBA1C 5.2 10/01/2021   HGBA1C 5.5 04/15/2021   Lab Results  Component Value Date   INSULIN 5.5 04/11/2023   INSULIN 6.4 12/02/2022   INSULIN 5.2 03/25/2022   INSULIN 6.1 10/01/2021   INSULIN 11.2 04/03/2019   Lab Results  Component Value Date   TSH 0.575 04/03/2019   Lab Results  Component Value Date   CHOL 161 04/15/2021   HDL 55 04/15/2021   LDLCALC 91 04/15/2021   TRIG 80 04/15/2021   Lab Results  Component Value Date   VD25OH 50.7 04/11/2023   VD25OH 58.8 12/02/2022   VD25OH 39.3 03/25/2022   Lab Results  Component Value Date   WBC 6.5 04/15/2021   HGB 13.0 04/15/2021   HCT 39 04/15/2021   MCV 86 06/24/2020   PLT 279 04/15/2021   Lab Results  Component Value Date   IRON 162 04/15/2021   TIBC 252 06/24/2020   FERRITIN 64 06/24/2020   Attestation Statements:   Reviewed by clinician on day of visit: allergies, medications, problem list, medical history, surgical history, family history, social history, and previous encounter notes.  I have reviewed  the above documentation for accuracy and completeness, and I agree with the above. -  Katrina Beard d. Armida Vickroy, NP-C

## 2023-06-06 ENCOUNTER — Other Ambulatory Visit: Payer: Self-pay | Admitting: Hematology and Oncology

## 2023-06-13 ENCOUNTER — Encounter (INDEPENDENT_AMBULATORY_CARE_PROVIDER_SITE_OTHER): Payer: Self-pay | Admitting: Adult Health

## 2023-06-13 ENCOUNTER — Ambulatory Visit (INDEPENDENT_AMBULATORY_CARE_PROVIDER_SITE_OTHER): Payer: 59 | Admitting: Adult Health

## 2023-06-13 ENCOUNTER — Other Ambulatory Visit (HOSPITAL_COMMUNITY): Payer: Self-pay

## 2023-06-13 VITALS — BP 129/75 | HR 74 | Temp 98.4°F | Ht 62.0 in | Wt 197.0 lb

## 2023-06-13 DIAGNOSIS — E1169 Type 2 diabetes mellitus with other specified complication: Secondary | ICD-10-CM

## 2023-06-13 DIAGNOSIS — I152 Hypertension secondary to endocrine disorders: Secondary | ICD-10-CM | POA: Diagnosis not present

## 2023-06-13 DIAGNOSIS — Z6835 Body mass index (BMI) 35.0-35.9, adult: Secondary | ICD-10-CM

## 2023-06-13 DIAGNOSIS — E559 Vitamin D deficiency, unspecified: Secondary | ICD-10-CM | POA: Diagnosis not present

## 2023-06-13 DIAGNOSIS — E1159 Type 2 diabetes mellitus with other circulatory complications: Secondary | ICD-10-CM | POA: Diagnosis not present

## 2023-06-13 DIAGNOSIS — E669 Obesity, unspecified: Secondary | ICD-10-CM

## 2023-06-13 DIAGNOSIS — Z7985 Long-term (current) use of injectable non-insulin antidiabetic drugs: Secondary | ICD-10-CM

## 2023-06-13 MED ORDER — ACCU-CHEK SOFTCLIX LANCETS MISC
0 refills | Status: AC
  Filled 2023-06-13: qty 100, 50d supply, fill #0

## 2023-06-13 MED ORDER — VITAMIN D (ERGOCALCIFEROL) 1.25 MG (50000 UNIT) PO CAPS
50000.0000 [IU] | ORAL_CAPSULE | ORAL | 0 refills | Status: DC
  Filled 2023-06-13: qty 4, 28d supply, fill #0

## 2023-06-13 MED ORDER — BLOOD GLUCOSE MONITOR KIT
PACK | 0 refills | Status: AC
  Filled 2023-06-13 (×2): qty 1, 30d supply, fill #0

## 2023-06-13 MED ORDER — FREESTYLE TEST VI STRP
1.0000 | ORAL_STRIP | Freq: Two times a day (BID) | 0 refills | Status: AC
  Filled 2023-06-13: qty 100, 50d supply, fill #0

## 2023-06-13 NOTE — Progress Notes (Signed)
 WEIGHT SUMMARY AND BIOMETRICS  Vitals Temp: 98.4 F (36.9 C) BP: 129/75 Pulse Rate: 74 SpO2: 99 %   Anthropometric Measurements Height: 5\' 2"  (1.575 m) Weight: 197 lb (89.4 kg) BMI (Calculated): 36.02 Weight at Last Visit: 200 lb Weight Lost Since Last Visit: 4 lb Weight Gained Since Last Visit: 0 Starting Weight: 318 lb Total Weight Loss (lbs): 35 lb (15.9 kg)   Body Composition  Body Fat %: 47.1 % Fat Mass (lbs): 92.6 lbs Muscle Mass (lbs): 98.6 lbs Total Body Water (lbs): 76.4 lbs Visceral Fat Rating : 14   Other Clinical Data Fasting: no Labs: no Today's Visit #: 27 Starting Date: 04/03/19    Chief Complaint:   OBESITY Katrina Beard is here to discuss her progress with her obesity treatment plan.  She is on the practicing portion control and making smarter food choices, such as increasing vegetables and decreasing simple carbohydrates and states she is following her eating plan approximately 70 % of the time.  She states she is exercising Walking 30 minutes 3 times per week.   Interim History:  11/01/2022 Mounjaro 10mg  d/c'd Started on weekly Ozempic 2mg  Conversion per pt request as she was gaining weight on Mounjaro and she felt that Ozempic "worked better". She has remained on weekly max dose Ozempic 2mg   She is currently following portion control/smart choices. Discussed following a more structured eating plan, she states "I do not want to be too limited" in her food choices.  Breakfast: Egg biscuit Programmer, multimedia) Lunch: Marylene Land- vegetables and dbl protein Dinner:"whatever I make"  Of Note- She has one foster daughter, 8th grade at [employer]. Malen Gauze daughter- failing 4 courses in 8th grade  Subjective:   1. Hypertension associated with type 2 diabetes mellitus (HCC) BP at goal OV She is currently on: aspirin EC 81 MG tablet  atorvastatin (LIPITOR) 20 MG tablet  valsartan (DIOVAN) 320 MG tablet  bisoprolol (ZEBETA) 5 MG tablet  losartan  (COZAAR) 100 MG tablet  amLODipine (NORVASC) 5 MG tablet   2. Vitamin D deficiency  Latest Reference Range & Units 12/02/22 12:31 04/11/23 12:39  Vitamin D, 25-Hydroxy 30.0 - 100.0 ng/mL 58.8 50.7   She is on weekly Ergocalciferol- denies N/V/Muscle Weakness  3. Type 2 diabetes mellitus with obesity (HCC)  Latest Reference Range & Units 12/02/22 12:31 04/11/23 12:39  Glucose 70 - 99 mg/dL 81 75  Hemoglobin Z6X 4.8 - 5.6 % 6.0 (H) 5.9 (H)  Est. average glucose Bld gHb Est-mCnc mg/dL 096 045  INSULIN 2.6 - 24.9 uIU/mL 6.4 5.5  (H): Data is abnormally high  She is currently on weekly Ozempic 2mg  Denies mass in neck, dysphagia, dyspepsia, persistent hoarseness, abdominal pain, or N/V/C  She has not been checking home CBG- needs new meter and test strips She denies sx's of hypoglycemia  Assessment/Plan:   1. Hypertension associated with type 2 diabetes mellitus (HCC) (Primary) Limit na+ Increase plain water intake Continue regular walking  2. Vitamin D deficiency Refill Vitamin D, Ergocalciferol, (DRISDOL) 1.25 MG (50000 UNIT) CAPS capsule Take 1 capsule (50,000 Units total) by mouth every 7 (seven) days. Dispense: 4 capsule, Refills: 0 ordered   3. Type 2 diabetes mellitus with obesity (HCC) Refill  blood glucose meter kit and supplies KIT Dispense based on patient and insurance preference. Use up to four times daily as directed. Dispense: 1 each, Refills: 0 ordered   4. Obesity, Current BMI 35.9  Katrina Beard is currently in the action stage of change. As  such, her goal is to continue with weight loss efforts. She has agreed to practicing portion control and making smarter food choices, such as increasing vegetables and decreasing simple carbohydrates.   Exercise goals: All adults should avoid inactivity. Some physical activity is better than none, and adults who participate in any amount of physical activity gain some health benefits. Adults should also include muscle-strengthening  activities that involve all major muscle groups on 2 or more days a week.  Behavioral modification strategies: increasing lean protein intake, decreasing simple carbohydrates, increasing vegetables, increasing water intake, no skipping meals, meal planning and cooking strategies, keeping healthy foods in the home, ways to avoid boredom eating, and planning for success.  Katrina Beard has agreed to follow-up with our clinic in 4 weeks. She was informed of the importance of frequent follow-up visits to maximize her success with intensive lifestyle modifications for her multiple health conditions.   Objective:   Blood pressure 129/75, pulse 74, temperature 98.4 F (36.9 C), height 5\' 2"  (1.575 m), weight 197 lb (89.4 kg), SpO2 99%. Body mass index is 36.03 kg/m.  General: Cooperative, alert, well developed, in no acute distress. HEENT: Conjunctivae and lids unremarkable. Cardiovascular: Regular rhythm.  Lungs: Normal work of breathing. Neurologic: No focal deficits.   Lab Results  Component Value Date   CREATININE 0.92 04/11/2023   BUN 14 04/11/2023   NA 141 04/11/2023   K 3.7 04/11/2023   CL 102 04/11/2023   CO2 23 04/11/2023   Lab Results  Component Value Date   ALT 32 04/11/2023   AST 24 04/11/2023   ALKPHOS 116 04/11/2023   BILITOT 0.5 04/11/2023   Lab Results  Component Value Date   HGBA1C 5.9 (H) 04/11/2023   HGBA1C 6.0 (H) 12/02/2022   HGBA1C 5.7 (H) 03/25/2022   HGBA1C 5.2 10/01/2021   HGBA1C 5.5 04/15/2021   Lab Results  Component Value Date   INSULIN 5.5 04/11/2023   INSULIN 6.4 12/02/2022   INSULIN 5.2 03/25/2022   INSULIN 6.1 10/01/2021   INSULIN 11.2 04/03/2019   Lab Results  Component Value Date   TSH 0.575 04/03/2019   Lab Results  Component Value Date   CHOL 161 04/15/2021   HDL 55 04/15/2021   LDLCALC 91 04/15/2021   TRIG 80 04/15/2021   Lab Results  Component Value Date   VD25OH 50.7 04/11/2023   VD25OH 58.8 12/02/2022   VD25OH 39.3 03/25/2022    Lab Results  Component Value Date   WBC 6.5 04/15/2021   HGB 13.0 04/15/2021   HCT 39 04/15/2021   MCV 86 06/24/2020   PLT 279 04/15/2021   Lab Results  Component Value Date   IRON 162 04/15/2021   TIBC 252 06/24/2020   FERRITIN 64 06/24/2020    Attestation Statements:   Reviewed by clinician on day of visit: allergies, medications, problem list, medical history, surgical history, family history, social history, and previous encounter notes.  I have reviewed the above documentation for accuracy and completeness, and I agree with the above. -  Akeia Perot d. Vyom Brass, NP-C

## 2023-06-13 NOTE — Addendum Note (Signed)
 Addended byMaury Dus, Hollace Michelli L on: 06/13/2023 02:07 PM   Modules accepted: Orders

## 2023-06-14 ENCOUNTER — Other Ambulatory Visit (HOSPITAL_COMMUNITY): Payer: Self-pay

## 2023-07-01 ENCOUNTER — Other Ambulatory Visit: Payer: Self-pay | Admitting: Hematology and Oncology

## 2023-07-26 ENCOUNTER — Encounter (INDEPENDENT_AMBULATORY_CARE_PROVIDER_SITE_OTHER): Payer: Self-pay | Admitting: Adult Health

## 2023-07-26 ENCOUNTER — Other Ambulatory Visit (HOSPITAL_COMMUNITY): Payer: Self-pay

## 2023-07-26 ENCOUNTER — Ambulatory Visit (INDEPENDENT_AMBULATORY_CARE_PROVIDER_SITE_OTHER): Admitting: Adult Health

## 2023-07-26 VITALS — BP 134/78 | HR 85 | Temp 98.4°F | Ht 62.0 in | Wt 201.0 lb

## 2023-07-26 DIAGNOSIS — E559 Vitamin D deficiency, unspecified: Secondary | ICD-10-CM

## 2023-07-26 DIAGNOSIS — Z7985 Long-term (current) use of injectable non-insulin antidiabetic drugs: Secondary | ICD-10-CM

## 2023-07-26 DIAGNOSIS — Z6836 Body mass index (BMI) 36.0-36.9, adult: Secondary | ICD-10-CM

## 2023-07-26 DIAGNOSIS — E1159 Type 2 diabetes mellitus with other circulatory complications: Secondary | ICD-10-CM | POA: Diagnosis not present

## 2023-07-26 DIAGNOSIS — E1169 Type 2 diabetes mellitus with other specified complication: Secondary | ICD-10-CM

## 2023-07-26 DIAGNOSIS — E66813 Obesity, class 3: Secondary | ICD-10-CM

## 2023-07-26 DIAGNOSIS — I152 Hypertension secondary to endocrine disorders: Secondary | ICD-10-CM

## 2023-07-26 DIAGNOSIS — E669 Obesity, unspecified: Secondary | ICD-10-CM

## 2023-07-26 MED ORDER — SEMAGLUTIDE (2 MG/DOSE) 8 MG/3ML ~~LOC~~ SOPN
2.0000 mg | PEN_INJECTOR | SUBCUTANEOUS | 0 refills | Status: DC
  Filled 2023-07-26: qty 9, 84d supply, fill #0

## 2023-07-26 MED ORDER — VITAMIN D (ERGOCALCIFEROL) 1.25 MG (50000 UNIT) PO CAPS
50000.0000 [IU] | ORAL_CAPSULE | ORAL | 0 refills | Status: DC
  Filled 2023-07-26: qty 4, 28d supply, fill #0

## 2023-07-26 NOTE — Progress Notes (Signed)
 WEIGHT SUMMARY AND BIOMETRICS  Vitals Temp: 98.4 F (36.9 C) BP: 134/78 Pulse Rate: 85 SpO2: 97 %   Anthropometric Measurements Height: 5\' 2"  (1.575 m) Weight: 201 lb (91.2 kg) BMI (Calculated): 36.75 Weight at Last Visit: 197 lb Weight Lost Since Last Visit: 0 Weight Gained Since Last Visit: 4 lb Starting Weight: 318 lb Total Weight Loss (lbs): 31 lb (14.1 kg)   Body Composition  Body Fat %: 47.7 % Fat Mass (lbs): 96.2 lbs Muscle Mass (lbs): 100.2 lbs Total Body Water (lbs): 78.4 lbs Visceral Fat Rating : 14   Other Clinical Data Fasting: no Labs: no Today's Visit #: 60 Starting Date: 04/03/19    Chief Complaint:   OBESITY Katrina Beard is here to discuss her progress with her obesity treatment plan.  She is on the practicing portion control and making smarter food choices, such as increasing vegetables and decreasing simple carbohydrates and states she is following her eating plan approximately 75 % of the time.  She states she is exercising Walking 20 minutes 4 times per week.  Interim History:  She is frustrated with lack of weight loss  11/01/2022 Mounjaro  10mg  d/c'd Started on weekly Ozempic  2mg  Conversion per pt request as she was gaining weight on Mounjaro  and she felt that Ozempic  "worked better". She has remained on weekly max dose Ozempic  2mg   Stress- She has one Katrina Beard, age 63 in 8th grade [employer] She is currently home due to a suspension for using foul language to school staff This child has been with [name] for > 9 months  Of Note- She is scheduled for L TKR on 09/05/2023 Her adult daughter will assist s/p procedure  Subjective:   1. Type 2 diabetes mellitus with obesity (HCC) Home fasting CBG will range from mid 90 to low 100s She denies sx's hypoglycemia 11/01/2022 Mounjaro  10mg  d/c'd Started on weekly Ozempic  2mg  Conversion per pt request as she was gaining weight on Mounjaro  and she felt that Ozempic   "worked better". She has remained on weekly max dose Ozempic  2mg  Denies mass in neck, dysphagia, dyspepsia, persistent hoarseness,  or N/V/C   She will scheduled for EGD/Colonoscopy to address chronic, intermittent abdominal pain that has been present well before either Mounjaro  or Ozempic  therapy  2. Vitamin D  deficiency  Latest Reference Range & Units 04/11/23 12:39  Vitamin D , 25-Hydroxy 30.0 - 100.0 ng/mL 50.7   She is on weekly Ergocalciferol - denies N/V/Muscle Weakness  3. Hypertension associated with type 2 diabetes mellitus (HCC) BP stable at OV She denies CP with exertion She denies tobacco/vape use She is on  aspirin EC 81 MG tablet  atorvastatin  (LIPITOR) 20 MG tablet  valsartan (DIOVAN) 320 MG tablet  bisoprolol (ZEBETA) 5 MG tablet  losartan  (COZAAR ) 100 MG tablet  amLODipine  (NORVASC ) 5 MG tablet    Assessment/Plan:   1. Type 2 diabetes mellitus with obesity (HCC) Refill Semaglutide , 2 MG/DOSE, 8 MG/3ML SOPN Inject 2 mg into the skin once a week. Dispense: 9 mL, Refills: 0 of 0 remaining   2. Vitamin D  deficiency Refill - Vitamin D , Ergocalciferol , (DRISDOL ) 1.25 MG (50000 UNIT) CAPS capsule; Take 1 capsule (50,000 Units total) by mouth every 7 (seven) days.  Dispense: 4 capsule; Refill: 0  3. Hypertension associated with type 2 diabetes mellitus (HCC) Limit Na+ Remain well hydrated Continue regular walking  4. Current BMI 36.9 (Primary)  [place] is currently in the action stage of change. As such, her goal is to  continue with weight loss efforts. She has agreed to practicing portion control and making smarter food choices, such as increasing vegetables and decreasing simple carbohydrates.   Exercise goals: All adults should avoid inactivity. Some physical activity is better than none, and adults who participate in any amount of physical activity gain some health benefits. Adults should also include muscle-strengthening activities that involve all major muscle  groups on 2 or more days a week.  Behavioral modification strategies: increasing lean protein intake, decreasing simple carbohydrates, increasing vegetables, increasing water intake, no skipping meals, meal planning and cooking strategies, keeping healthy foods in the home, ways to avoid boredom eating, and planning for success.  Katrina Beard has agreed to follow-up with our clinic in 4 weeks. She was informed of the importance of frequent follow-up visits to maximize her success with intensive lifestyle modifications for her multiple health conditions.   Check Fasting Labs and IC at next OV- pt aware to arrive 30 mins early and fasting.  Objective:   Blood pressure 134/78, pulse 85, temperature 98.4 F (36.9 C), height 5\' 2"  (1.575 m), weight 201 lb (91.2 kg), SpO2 97%. Body mass index is 36.76 kg/m.  General: Cooperative, alert, well developed, in no acute distress. HEENT: Conjunctivae and lids unremarkable. Cardiovascular: Regular rhythm.  Lungs: Normal work of breathing. Neurologic: No focal deficits.   Lab Results  Component Value Date   CREATININE 0.92 04/11/2023   BUN 14 04/11/2023   NA 141 04/11/2023   K 3.7 04/11/2023   CL 102 04/11/2023   CO2 23 04/11/2023   Lab Results  Component Value Date   ALT 32 04/11/2023   AST 24 04/11/2023   ALKPHOS 116 04/11/2023   BILITOT 0.5 04/11/2023   Lab Results  Component Value Date   HGBA1C 5.9 (H) 04/11/2023   HGBA1C 6.0 (H) 12/02/2022   HGBA1C 5.7 (H) 03/25/2022   HGBA1C 5.2 10/01/2021   HGBA1C 5.5 04/15/2021   Lab Results  Component Value Date   INSULIN  5.5 04/11/2023   INSULIN  6.4 12/02/2022   INSULIN  5.2 03/25/2022   INSULIN  6.1 10/01/2021   INSULIN  11.2 04/03/2019   Lab Results  Component Value Date   TSH 0.575 04/03/2019   Lab Results  Component Value Date   CHOL 161 04/15/2021   HDL 55 04/15/2021   LDLCALC 91 04/15/2021   TRIG 80 04/15/2021   Lab Results  Component Value Date   VD25OH 50.7 04/11/2023    VD25OH 58.8 12/02/2022   VD25OH 39.3 03/25/2022   Lab Results  Component Value Date   WBC 6.5 04/15/2021   HGB 13.0 04/15/2021   HCT 39 04/15/2021   MCV 86 06/24/2020   PLT 279 04/15/2021   Attestation Statements:   Reviewed by clinician on day of visit: allergies, medications, problem list, medical history, surgical history, family history, social history, and previous encounter notes.  I have reviewed the above documentation for accuracy and completeness, and I agree with the above. -  Rutherford Alarie d. Jep Dyas, NP-C

## 2023-07-30 ENCOUNTER — Other Ambulatory Visit: Payer: Self-pay | Admitting: Hematology and Oncology

## 2023-08-10 ENCOUNTER — Other Ambulatory Visit: Payer: Self-pay | Admitting: Orthopedic Surgery

## 2023-08-22 ENCOUNTER — Other Ambulatory Visit (HOSPITAL_COMMUNITY): Payer: Self-pay

## 2023-08-22 ENCOUNTER — Ambulatory Visit (INDEPENDENT_AMBULATORY_CARE_PROVIDER_SITE_OTHER): Admitting: Adult Health

## 2023-08-22 ENCOUNTER — Encounter (INDEPENDENT_AMBULATORY_CARE_PROVIDER_SITE_OTHER): Payer: Self-pay | Admitting: Adult Health

## 2023-08-22 VITALS — BP 137/73 | HR 69 | Temp 98.1°F | Ht 62.0 in | Wt 200.0 lb

## 2023-08-22 DIAGNOSIS — E1169 Type 2 diabetes mellitus with other specified complication: Secondary | ICD-10-CM

## 2023-08-22 DIAGNOSIS — E559 Vitamin D deficiency, unspecified: Secondary | ICD-10-CM

## 2023-08-22 DIAGNOSIS — R0602 Shortness of breath: Secondary | ICD-10-CM | POA: Diagnosis not present

## 2023-08-22 DIAGNOSIS — I152 Hypertension secondary to endocrine disorders: Secondary | ICD-10-CM | POA: Diagnosis not present

## 2023-08-22 DIAGNOSIS — M25562 Pain in left knee: Secondary | ICD-10-CM

## 2023-08-22 DIAGNOSIS — E1159 Type 2 diabetes mellitus with other circulatory complications: Secondary | ICD-10-CM | POA: Diagnosis not present

## 2023-08-22 DIAGNOSIS — G8929 Other chronic pain: Secondary | ICD-10-CM

## 2023-08-22 DIAGNOSIS — Z7985 Long-term (current) use of injectable non-insulin antidiabetic drugs: Secondary | ICD-10-CM

## 2023-08-22 DIAGNOSIS — Z6835 Body mass index (BMI) 35.0-35.9, adult: Secondary | ICD-10-CM

## 2023-08-22 DIAGNOSIS — Z6836 Body mass index (BMI) 36.0-36.9, adult: Secondary | ICD-10-CM

## 2023-08-22 DIAGNOSIS — E669 Obesity, unspecified: Secondary | ICD-10-CM

## 2023-08-22 MED ORDER — VITAMIN D (ERGOCALCIFEROL) 1.25 MG (50000 UNIT) PO CAPS
50000.0000 [IU] | ORAL_CAPSULE | ORAL | 0 refills | Status: DC
  Filled 2023-08-22 – 2023-08-31 (×2): qty 4, 28d supply, fill #0

## 2023-08-22 NOTE — Progress Notes (Signed)
 WEIGHT SUMMARY AND BIOMETRICS  Vitals Temp: 98.1 F (36.7 C) BP: 137/73 Pulse Rate: 69 SpO2: 97 %   Anthropometric Measurements Height: 5\' 2"  (1.575 m) Weight: 200 lb (90.7 kg) BMI (Calculated): 36.57 Weight at Last Visit: 201 lb Weight Lost Since Last Visit: 1 lb Weight Gained Since Last Visit: 0 Starting Weight: 318 lb Total Weight Loss (lbs): 32 lb (14.5 kg)   Body Composition  Body Fat %: 48.8 % Fat Mass (lbs): 97.8 lbs Muscle Mass (lbs): 97.4 lbs Total Body Water (lbs): 78.8 lbs Visceral Fat Rating : 14   Other Clinical Data RMR: 1325 Fasting: yes Labs: yes Today's Visit #: 8 Starting Date: 04/03/19    Chief Complaint:   OBESITY Katrina Beard is here to discuss her progress with her obesity treatment plan.  She is on the practicing portion control and making smarter food choices, such as increasing vegetables and decreasing simple carbohydrates and states she is following her eating plan approximately 70 % of the time.  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  She continues to experience strife with her current foster child- she does feel safe at home.  Subjective:   1. Type 2 diabetes mellitus with obesity (HCC) Lab Results  Component Value Date   HGBA1C 5.9 (H) 04/11/2023   HGBA1C 6.0 (H) 12/02/2022   HGBA1C 5.7 (H) 03/25/2022    She has not been checking home CBG She denies sx's of hypoglycemia She is currently on weekly Ozempic  2mg  Denies mass in neck, dysphagia, dyspepsia, persistent hoarseness, abdominal pain, or N/V/C   2. Shortness of breath She endorses dyspnea with extreme exertion, denies CP Anticipated BMR  1461   04/03/19 09:00  RMR 1901   Jan 2021 RMR faster than  expected   08/22/23 09:00  RMR 1325   Today- RMR decreased and now slower than expexted She has been following a portion control/smart choice eating plan for some time now  3. Vitamin D  deficiency She endorses stable energy levels  4. Hypertension associated with type 2 diabetes mellitus (HCC) BP stable at OV BP goal <130/70 Today's pressure 137/73  She denies CP with exertion  She is currently on: amLODipine  (NORVASC ) 5 MG tablet  aspirin 81 MG chewable tablet  atorvastatin  (LIPITOR) 20 MG tablet  valsartan (DIOVAN) 320 MG tablet   5. Chronic pain of left knee 09/05/2023 Surgeon Role  Neil Balls, MD Primary   Procedure Laterality Anesthesia  ARTHROPLASTY, KNEE, TOTAL Left Spinal     She reports L knee pain > 3 years Today she reports pain and rates at 7/10  Assessment/Plan:   1. Type 2 diabetes mellitus with obesity (HCC) (Primary) Check Labs - Hemoglobin A1c - Insulin , random  2. Shortness of breath Maintain Weight: 1350 cal/90g protein Loss Weight: 1000 cal/75g protein  3. Vitamin D  deficiency Check Labs - VITAMIN D  25 Hydroxy (Vit-D Deficiency, Fractures) Refill - Vitamin D , Ergocalciferol , (DRISDOL ) 1.25 MG (50000 UNIT) CAPS capsule; Take 1 capsule (50,000 Units total) by mouth every 7 (seven) days.  Dispense: 4 capsule; Refill: 0  4. Hypertension associated with type 2 diabetes mellitus (HCC) Check Labs - Comprehensive metabolic panel with GFR  5. Chronic pain of left knee Hold Ozempic  per  surgeons orders Increase protein intake  6. BMI 35.0-35.9,adult, CURRENT BMI 36.7  [name] is currently in the action stage of change. As such, her goal is to continue with weight loss efforts. She has agreed to practicing portion control and making smarter food choices, such as increasing vegetables and decreasing simple carbohydrates.   Maintain Weight: 1350 cal/90g protein Loss Weight: 1000 cal/75g protein  Exercise goals: No exercise has been prescribed at  this time.  Behavioral modification strategies: increasing lean protein intake, decreasing simple carbohydrates, increasing vegetables, increasing water intake, meal planning and cooking strategies, keeping healthy foods in the home, ways to avoid boredom eating, ways to avoid night time snacking, and planning for success.  [name] has agreed to follow-up with our clinic in 4 weeks. She was informed of the importance of frequent follow-up visits to maximize her success with intensive lifestyle modifications for her multiple health conditions.   Starlit was informed we would discuss her lab results at her next visit unless there is a critical issue that needs to be addressed sooner. [name] agreed to keep her next visit at the agreed upon time to discuss these results.  Objective:   Blood pressure 137/73, pulse 69, temperature 98.1 F (36.7 C), height 5\' 2"  (1.575 m), weight 200 lb (90.7 kg), SpO2 97%. Body mass index is 36.58 kg/m.  General: Cooperative, alert, well developed, in no acute distress. HEENT: Conjunctivae and lids unremarkable. Cardiovascular: Regular rhythm.  Lungs: Normal work of breathing. Neurologic: No focal deficits.   Lab Results  Component Value Date   CREATININE 0.92 04/11/2023   BUN 14 04/11/2023   NA 141 04/11/2023   K 3.7 04/11/2023   CL 102 04/11/2023   CO2 23 04/11/2023   Lab Results  Component Value Date   ALT 32 04/11/2023   AST 24 04/11/2023   ALKPHOS 116 04/11/2023   BILITOT 0.5 04/11/2023   Lab Results  Component Value Date   HGBA1C 5.9 (H) 04/11/2023   HGBA1C 6.0 (H) 12/02/2022   HGBA1C 5.7 (H) 03/25/2022   HGBA1C 5.2 10/01/2021   HGBA1C 5.5 04/15/2021   Lab Results  Component Value Date   INSULIN  5.5 04/11/2023   INSULIN  6.4 12/02/2022   INSULIN  5.2 03/25/2022   INSULIN  6.1 10/01/2021   INSULIN  11.2 04/03/2019   Lab Results  Component Value Date   TSH 0.575 04/03/2019   Lab Results  Component Value Date   CHOL 161 04/15/2021    HDL 55 04/15/2021   LDLCALC 91 04/15/2021   TRIG 80 04/15/2021   Lab Results  Component Value Date   VD25OH 50.7 04/11/2023   VD25OH 58.8 12/02/2022   VD25OH 39.3 03/25/2022   Lab Results  Component Value Date   WBC 6.5 04/15/2021   HGB 13.0 04/15/2021   HCT 39 04/15/2021   MCV 86 06/24/2020   PLT 279 04/15/2021   Lab Results  Component Value Date   IRON 162 04/15/2021   TIBC 252 06/24/2020   FERRITIN 64 06/24/2020   Attestation Statements:   Reviewed by clinician on day of visit: allergies, medications, problem list, medical history, surgical history, family history, social history, and previous encounter notes.  I have reviewed the above documentation for accuracy and completeness, and I agree with the above. -  Makana Feigel d. Tanielle Emigh, NP-C

## 2023-08-23 LAB — COMPREHENSIVE METABOLIC PANEL WITH GFR
ALT: 35 IU/L — ABNORMAL HIGH (ref 0–32)
AST: 29 IU/L (ref 0–40)
Albumin: 4 g/dL (ref 3.9–4.9)
Alkaline Phosphatase: 122 IU/L — ABNORMAL HIGH (ref 44–121)
BUN/Creatinine Ratio: 13 (ref 12–28)
BUN: 12 mg/dL (ref 8–27)
Bilirubin Total: 0.6 mg/dL (ref 0.0–1.2)
CO2: 22 mmol/L (ref 20–29)
Calcium: 9.1 mg/dL (ref 8.7–10.3)
Chloride: 104 mmol/L (ref 96–106)
Creatinine, Ser: 0.91 mg/dL (ref 0.57–1.00)
Globulin, Total: 2.9 g/dL (ref 1.5–4.5)
Glucose: 89 mg/dL (ref 70–99)
Potassium: 3.9 mmol/L (ref 3.5–5.2)
Sodium: 142 mmol/L (ref 134–144)
Total Protein: 6.9 g/dL (ref 6.0–8.5)
eGFR: 72 mL/min/{1.73_m2} (ref >59)

## 2023-08-23 LAB — INSULIN, RANDOM: INSULIN: 6.7 u[IU]/mL (ref 2.6–24.9)

## 2023-08-23 LAB — HEMOGLOBIN A1C
Est. average glucose Bld gHb Est-mCnc: 126 mg/dL
Hgb A1c MFr Bld: 6 % — ABNORMAL HIGH (ref 4.8–5.6)

## 2023-08-23 LAB — VITAMIN D 25 HYDROXY (VIT D DEFICIENCY, FRACTURES): Vit D, 25-Hydroxy: 49.9 ng/mL (ref 30.0–100.0)

## 2023-08-26 NOTE — Progress Notes (Signed)
 COVID Vaccine Completed:  Date of COVID positive in last 90 days: yes  PCP - Olin Bertin, MD Cardiologist - n/a  Chest x-ray - 08/29/23 Epic EKG - 08/29/23 Epic/chart Stress Test - long time ago per pt, no problems per pt ECHO - n/a Cardiac Cath - n/a Pacemaker/ICD device last checked: n/a Spinal Cord Stimulator: n/a  Bowel Prep - no  Sleep Study - n/a CPAP -   Fasting Blood Sugar 104-125  Checks Blood Sugar every 2 weeks  Last dose of GLP1 agonist-  Ozempic  GLP1 instructions:  Hold 7 days before surgery. Do not take after 08/29/23   Last dose of SGLT-2 inhibitors-  N/A SGLT-2 instructions:  Hold 3 days before surgery    Blood Thinner Instructions:  Last dose:   Time: Aspirin Instructions: ASA 81, on hold Last Dose:  Activity level: Can go up a flight of stairs and perform activities of daily living without stopping and without symptoms of chest pain or shortness of breath.   Anesthesia review: HTN, atherosclerotic heart disease, DM2, CKD, anemia, CHF, CAD  Patient denies shortness of breath, fever, cough and chest pain at PAT appointment  Patient verbalized understanding of instructions that were given to them at the PAT appointment. Patient was also instructed that they will need to review over the PAT instructions again at home before surgery.

## 2023-08-26 NOTE — Patient Instructions (Signed)
 SURGICAL WAITING ROOM VISITATION  Patients having surgery or a procedure may have no more than 2 support people in the waiting area - these visitors may rotate.    Children under the age of 37 must have an adult with them who is not the patient.  Visitors with respiratory illnesses are discouraged from visiting and should remain at home.  If the patient needs to stay at the hospital during part of their recovery, the visitor guidelines for inpatient rooms apply. Pre-op nurse will coordinate an appropriate time for 1 support person to accompany patient in pre-op.  This support person may not rotate.    Please refer to the [facility] website for the visitor guidelines for Inpatients (after your surgery is over and you are in a regular room).    Your procedure is scheduled on: 09/05/23   Report to [facility] Main Entrance    Report to admitting at 5:15 AM   Call this number if you have problems the morning of surgery [phone]   Do not eat food :After Midnight.   After Midnight you may have the following liquids until 4:30 AM DAY OF SURGERY  Water Non-Citrus Juices (without pulp, NO RED-Apple, White grape, White cranberry) Black Coffee (NO MILK/CREAM OR CREAMERS, sugar ok)  Clear Tea (NO MILK/CREAM OR CREAMERS, sugar ok) regular and decaf                             Plain Jell-O (NO RED)                                           Fruit ices (not with fruit pulp, NO RED)                                     Popsicles (NO RED)                                                               Sports drinks like Gatorade (NO RED)    The day of surgery:  Drink ONE (1) Pre-Surgery G2 at 4:30 AM the morning of surgery. Drink in one sitting. Do not sip.  This drink was given to you during your hospital  pre-op appointment visit. Nothing else to drink after completing the  Pre-Surgery G2.          If you have questions, please contact your surgeon's office.   FOLLOW BOWEL PREP  AND ANY ADDITIONAL PRE OP INSTRUCTIONS YOU RECEIVED FROM YOUR SURGEON'S OFFICE!!!     Oral Hygiene is also important to reduce your risk of infection.                                    Remember - BRUSH YOUR TEETH THE MORNING OF SURGERY WITH YOUR REGULAR TOOTHPASTE  DENTURES WILL BE REMOVED PRIOR TO SURGERY PLEASE DO NOT APPLY Poly grip OR ADHESIVES!!!   Stop all vitamins and herbal supplements 7 days before surgery.  Take these medicines the morning of surgery with A SIP OF WATER: Inhalers, Amlodipine , Atorvastatin , Flonase , Pantoprazole , Alprazolam    DO NOT TAKE ANY ORAL DIABETIC MEDICATIONS DAY OF YOUR SURGERY  How to Manage Your Diabetes Before and After Surgery  Why is it important to control my blood sugar before and after surgery? Improving blood sugar levels before and after surgery helps healing and can limit problems. A way of improving blood sugar control is eating a healthy diet by:  Eating less sugar and carbohydrates  Increasing activity/exercise  Talking with your doctor about reaching your blood sugar goals High blood sugars (greater than 180 mg/dL) can raise your risk of infections and slow your recovery, so you will need to focus on controlling your diabetes during the weeks before surgery. Make sure that the doctor who takes care of your diabetes knows about your planned surgery including the date and location.  How do I manage my blood sugar before surgery? Check your blood sugar at least 4 times a day, starting 2 days before surgery, to make sure that the level is not too high or low. Check your blood sugar the morning of your surgery when you wake up and every 2 hours until you get to the Short Stay unit. If your blood sugar is less than 70 mg/dL, you will need to treat for low blood sugar: Do not take insulin . Treat a low blood sugar (less than 70 mg/dL) with  cup of clear juice (cranberry or apple), 4 glucose tablets, OR glucose gel. Recheck blood sugar in  15 minutes after treatment (to make sure it is greater than 70 mg/dL). If your blood sugar is not greater than 70 mg/dL on recheck, call [phone] for further instructions. Report your blood sugar to the short stay nurse when you get to Short Stay.  If you are admitted to the hospital after surgery: Your blood sugar will be checked by the staff and you will probably be given insulin  after surgery (instead of oral diabetes medicines) to make sure you have good blood sugar levels. The goal for blood sugar control after surgery is 80-180 mg/dL.   WHAT DO I DO ABOUT MY DIABETES MEDICATION?  Do not take oral diabetes medicines (pills) the morning of surgery.  Do not take Ozempic  after 08/29/23.  DO NOT TAKE THE FOLLOWING 7 DAYS PRIOR TO SURGERY: Ozempic , Wegovy , Rybelsus  (Semaglutide ), Byetta (exenatide), Bydureon (exenatide ER), Victoza, Saxenda (liraglutide), or Trulicity (dulaglutide) Mounjaro  (Tirzepatide ) Adlyxin (Lixisenatide), Polyethylene Glycol Loxenatide.  Reviewed and Endorsed by [facility] Patient Education Committee, August 2015                              You may not have any metal on your body including hair pins, jewelry, and body piercing             Do not wear make-up, lotions, powders, perfumes or deodorant  Do not wear nail polish including gel and S&S, artificial/acrylic nails, or any other type of covering on natural nails including finger and toenails. If you have artificial nails, gel coating, etc. that needs to be removed by a nail salon please have this removed prior to surgery or surgery may need to be canceled/ delayed if the surgeon/ anesthesia feels like they are unable to be safely monitored.   Do not shave  48 hours prior to surgery.    Do not bring valuables to the hospital. [facility] IS  NOT             RESPONSIBLE   FOR VALUABLES.   Contacts, glasses, dentures or bridgework may not be worn into surgery.  DO NOT BRING YOUR HOME MEDICATIONS TO THE  HOSPITAL. PHARMACY WILL DISPENSE MEDICATIONS LISTED ON YOUR MEDICATION LIST TO YOU DURING YOUR ADMISSION IN THE HOSPITAL!    Patients discharged on the day of surgery will not be allowed to drive home.  Someone NEEDS to stay with you for the first 24 hours after anesthesia.              Please read over the following fact sheets you were given: IF YOU HAVE QUESTIONS ABOUT YOUR PRE-OP INSTRUCTIONS PLEASE CALL [phone] Beard    If you received a COVID test during your pre-op visit  it is requested that you wear a mask when out in public, stay away from anyone that may not be feeling well and notify your surgeon if you develop symptoms. If you test positive for Covid or have been in contact with anyone that has tested positive in the last 10 days please notify you surgeon.      Pre-operative 5 CHG Bath Instructions   You can play a key role in reducing the risk of infection after surgery. Your skin needs to be as free of germs as possible. You can reduce the number of germs on your skin by washing with CHG (chlorhexidine  gluconate) soap before surgery. CHG is an antiseptic soap that kills germs and continues to kill germs even after washing.   DO NOT use if you have an allergy to chlorhexidine /CHG or antibacterial soaps. If your skin becomes reddened or irritated, stop using the CHG and notify one of our RNs at [phone].   Please shower with the CHG soap starting 4 days before surgery using the following schedule:     Please keep in mind the following:  DO NOT shave, including legs and underarms, starting the day of your first shower.   You may shave your face at any point before/day of surgery.  Place clean sheets on your bed the day you start using CHG soap. Use a clean washcloth (not used since being washed) for each shower. DO NOT sleep with pets once you start using the CHG.   CHG Shower Instructions:  If you choose to wash your hair and private area, wash first with your  normal shampoo/soap.  After you use shampoo/soap, rinse your hair and body thoroughly to remove shampoo/soap residue.  Turn the water OFF and apply about 3 tablespoons (45 ml) of CHG soap to a CLEAN washcloth.  Apply CHG soap ONLY FROM YOUR NECK DOWN TO YOUR TOES (washing for 3-5 minutes)  DO NOT use CHG soap on face, private areas, open wounds, or sores.  Pay special attention to the area where your surgery is being performed.  If you are having back surgery, having someone wash your back for you may be helpful. Wait 2 minutes after CHG soap is applied, then you may rinse off the CHG soap.  Pat dry with a clean towel  Put on clean clothes/pajamas   If you choose to wear lotion, please use ONLY the CHG-compatible lotions on the back of this paper.     Additional instructions for the day of surgery: DO NOT APPLY any lotions, deodorants, cologne, or perfumes.   Put on clean/comfortable clothes.  Brush your teeth.  Ask your nurse before applying any prescription medications to the skin.  CHG Compatible Lotions   Aveeno Moisturizing lotion  Cetaphil Moisturizing Cream  Cetaphil Moisturizing Lotion  Clairol Herbal Essence Moisturizing Lotion, Dry Skin  Clairol Herbal Essence Moisturizing Lotion, Extra Dry Skin  Clairol Herbal Essence Moisturizing Lotion, Normal Skin  Curel Age Defying Therapeutic Moisturizing Lotion with Alpha Hydroxy  Curel Extreme Care Body Lotion  Curel Soothing Hands Moisturizing Hand Lotion  Curel Therapeutic Moisturizing Cream, Fragrance-Free  Curel Therapeutic Moisturizing Lotion, Fragrance-Free  Curel Therapeutic Moisturizing Lotion, Original Formula  Eucerin Daily Replenishing Lotion  Eucerin Dry Skin Therapy Plus Alpha Hydroxy Crme  Eucerin Dry Skin Therapy Plus Alpha Hydroxy Lotion  Eucerin Original Crme  Eucerin Original Lotion  Eucerin Plus Crme Eucerin Plus Lotion  Eucerin TriLipid Replenishing Lotion  Keri Anti-Bacterial Hand Lotion  Keri  Deep Conditioning Original Lotion Dry Skin Formula Softly Scented  Keri Deep Conditioning Original Lotion, Fragrance Free Sensitive Skin Formula  Keri Lotion Fast Absorbing Fragrance Free Sensitive Skin Formula  Keri Lotion Fast Absorbing Softly Scented Dry Skin Formula  Keri Original Lotion  Keri Skin Renewal Lotion Keri Silky Smooth Lotion  Keri Silky Smooth Sensitive Skin Lotion  Nivea Body Creamy Conditioning Oil  Nivea Body Extra Enriched Lotion  Nivea Body Original Lotion  Nivea Body Sheer Moisturizing Lotion Nivea Crme  Nivea Skin Firming Lotion  NutraDerm 30 Skin Lotion  NutraDerm Skin Lotion  NutraDerm Therapeutic Skin Cream  NutraDerm Therapeutic Skin Lotion  ProShield Protective Hand Cream  Provon moisturizing lotion   Incentive Spirometer  An incentive spirometer is a tool that can help keep your lungs clear and active. This tool measures how well you are filling your lungs with each breath. Taking long deep breaths may help reverse or decrease the chance of developing breathing (pulmonary) problems (especially infection) following: A long period of time when you are unable to move or be active. BEFORE THE PROCEDURE  If the spirometer includes an indicator to show your best effort, your nurse or respiratory therapist will set it to a desired goal. If possible, sit up straight or lean slightly forward. Try not to slouch. Hold the incentive spirometer in an upright position. INSTRUCTIONS FOR USE  Sit on the edge of your bed if possible, or sit up as far as you can in bed or on a chair. Hold the incentive spirometer in an upright position. Breathe out normally. Place the mouthpiece in your mouth and seal your lips tightly around it. Breathe in slowly and as deeply as possible, raising the piston or the ball toward the top of the column. Hold your breath for 3-5 seconds or for as long as possible. Allow the piston or ball to fall to the bottom of the column. Remove the  mouthpiece from your mouth and breathe out normally. Rest for a few seconds and repeat Steps 1 through 7 at least 10 times every 1-2 hours when you are awake. Take your time and take a few normal breaths between deep breaths. The spirometer may include an indicator to show your best effort. Use the indicator as a goal to work toward during each repetition. After each set of 10 deep breaths, practice coughing to be sure your lungs are clear. If you have an incision (the cut made at the time of surgery), support your incision when coughing by placing a pillow or rolled up towels firmly against it. Once you are able to get out of bed, walk around indoors and cough well. You may stop using the incentive spirometer when instructed by  your caregiver.  RISKS AND COMPLICATIONS Take your time so you do not get dizzy or light-headed. If you are in pain, you may need to take or ask for pain medication before doing incentive spirometry. It is harder to take a deep breath if you are having pain. AFTER USE Rest and breathe slowly and easily. It can be helpful to keep track of a log of your progress. Your caregiver can provide you with a simple table to help with this. If you are using the spirometer at home, follow these instructions: SEEK MEDICAL CARE IF:  You are having difficultly using the spirometer. You have trouble using the spirometer as often as instructed. Your pain medication is not giving enough relief while using the spirometer. You develop fever of 100.5 F (38.1 C) or higher. SEEK IMMEDIATE MEDICAL CARE IF:  You cough up bloody sputum that had not been present before. You develop fever of 102 F (38.9 C) or greater. You develop worsening pain at or near the incision site. MAKE SURE YOU:  Understand these instructions. Will watch your condition. Will get help right away if you are not doing well or get worse. Document Released: 07/12/2006 Document Revised: 05/24/2011 Document Reviewed:  09/12/2006 [facility] Patient Information 2014 [place], [place].   ________________________________________________________________________

## 2023-08-29 ENCOUNTER — Other Ambulatory Visit: Payer: Self-pay

## 2023-08-29 ENCOUNTER — Encounter (HOSPITAL_COMMUNITY)
Admission: RE | Admit: 2023-08-29 | Discharge: 2023-08-29 | Disposition: A | Discharge status: Home / Self Care | Source: Ambulatory Visit | Attending: Orthopedic Surgery | Admitting: Orthopedic Surgery

## 2023-08-29 ENCOUNTER — Ambulatory Visit (HOSPITAL_COMMUNITY)
Admission: RE | Admit: 2023-08-29 | Discharge: 2023-08-29 | Disposition: A | Discharge status: Home / Self Care | Source: Ambulatory Visit | Attending: Orthopedic Surgery | Admitting: Orthopedic Surgery

## 2023-08-29 ENCOUNTER — Encounter (HOSPITAL_COMMUNITY): Payer: Self-pay

## 2023-08-29 VITALS — BP 133/94 | HR 66 | Temp 98.6°F | Resp 12 | Ht 62.0 in | Wt 199.0 lb

## 2023-08-29 DIAGNOSIS — Z01818 Encounter for other preprocedural examination: Secondary | ICD-10-CM | POA: Diagnosis present

## 2023-08-29 DIAGNOSIS — E119 Type 2 diabetes mellitus without complications: Secondary | ICD-10-CM | POA: Diagnosis present

## 2023-08-29 HISTORY — DX: Unspecified osteoarthritis, unspecified site: M19.90

## 2023-08-29 HISTORY — DX: Anxiety disorder, unspecified: F41.9

## 2023-08-29 HISTORY — DX: Anemia, unspecified: D64.9

## 2023-08-29 LAB — CBC
HCT: 40 % (ref 36.0–46.0)
Hemoglobin: 12.8 g/dL (ref 12.0–15.0)
MCH: 30.4 pg (ref 26.0–34.0)
MCHC: 32 g/dL (ref 30.0–36.0)
MCV: 95 fL (ref 80.0–100.0)
Platelets: 209 10*3/uL (ref 150–400)
RBC: 4.21 MIL/uL (ref 3.87–5.11)
RDW: 13.6 % (ref 11.5–15.5)
WBC: 5.6 10*3/uL (ref 4.0–10.5)
nRBC: 0 % (ref 0.0–0.2)

## 2023-08-29 LAB — GLUCOSE, CAPILLARY: Glucose-Capillary: 101 mg/dL — ABNORMAL HIGH (ref 70–99)

## 2023-08-29 LAB — SURGICAL PCR SCREEN
MRSA, PCR: NEGATIVE
Staphylococcus aureus: NEGATIVE

## 2023-08-31 ENCOUNTER — Other Ambulatory Visit (HOSPITAL_COMMUNITY): Payer: Self-pay

## 2023-09-01 ENCOUNTER — Encounter (HOSPITAL_COMMUNITY): Payer: Self-pay

## 2023-09-01 NOTE — Anesthesia Preprocedure Evaluation (Signed)
 Anesthesia Evaluation    Airway        Dental   Pulmonary           Cardiovascular hypertension,      Neuro/Psych    GI/Hepatic   Endo/Other  diabetes    Renal/GU      Musculoskeletal   Abdominal   Peds  Hematology   Anesthesia Other Findings   Reproductive/Obstetrics                              Anesthesia Physical Anesthesia Plan  ASA:   Anesthesia Plan:    Post-op Pain Management:    Induction:   PONV Risk Score and Plan:   Airway Management Planned:   Additional Equipment:   Intra-op Plan:   Post-operative Plan:   Informed Consent:   Plan Discussed with:   Anesthesia Plan Comments: (See PAT note from 6/16)         Anesthesia Quick Evaluation

## 2023-09-01 NOTE — Progress Notes (Addendum)
 Case: [id number] Date/Time: 09/05/23 0715   Procedure: ARTHROPLASTY, KNEE, TOTAL (Left: Knee)   Anesthesia type: Spinal   Pre-op diagnosis: LEFT KNEE DEGENERATIVE JOINT DISEASE   Location: [facility] 08 / [facility]   Surgeons: Katrina Balls, MD       DISCUSSION: Katrina Beard is a 63 yo female who presents to PAT prior to surgery above. PMH of HTN, asthma, GERD, DM (A1c 6.0), arthritis, obesity, hx of breast cancer s/p lumpectomy and XRT (2023), anxiety, depression.  In patient's medical hx was listed old MI in 2003, CAD, CHF. Discussed with patient over the phone. She states that she went to the hospital in 2003 for SOB and was kept overnight for observation. She was told she was having a heart attack from her EKG however she was released the next day and told that nothing was wrong with her heart. She never had a cath, stress test, cardiac CT or echo. She has never seen Cardiology. She denies chest pain or SOB and can do a flight of stairs without difficulty. Will remove these diagnoses from her medical hx.  LD Ozempic : 6/16  VS: BP (!) 133/94   Pulse 66   Temp 37 C (Oral)   Resp 12   Ht 5' 2 (1.575 m)   Wt 90.3 kg   LMP 11/09/2021 (Exact Date)   SpO2 95%   BMI 36.40 kg/m   PROVIDERS: Katrina Bertin, MD   LABS: Labs reviewed: Acceptable for surgery. (all labs ordered are listed, but only abnormal results are displayed)  Labs Reviewed  GLUCOSE, CAPILLARY - Abnormal; Notable for the following components:      Result Value   Glucose-Capillary 101 (*)    All other components within normal limits  SURGICAL PCR SCREEN  CBC     IMAGES:  CXR 08/29/23:  IMPRESSION: No active cardiopulmonary disease.   EKG 08/29/23:  Sinus rhythm with Premature atrial complexes, rate 64 Anterior infarct , age undetermined  CV:  Past Medical History:  Diagnosis Date   Anemia    iron def   Anxiety    Arthritis    Asthma    Breast cancer (HCC)    Diabetes mellitus    Gallbladder  problem    GERD (gastroesophageal reflux disease)    HLD (hyperlipidemia)    Hypertension    Joint pain    Kidney stone    Knee pain    Personal history of radiation therapy    Vitamin D  deficiency     Past Surgical History:  Procedure Laterality Date   BREAST BIOPSY Left 05/15/2021   x2   BREAST BIOPSY Left 04/19/2022   MM LT BREAST BX W LOC DEV 1ST LESION IMAGE BX SPEC STEREO GUIDE 04/19/2022 [facility]   BREAST BIOPSY  06/21/2022   MM LT RADIOACTIVE SEED LOC MAMMO GUIDE 06/21/2022 [facility]   BREAST BIOPSY Left 04/20/2023   MM LT BREAST BX W LOC DEV 1ST LESION IMAGE BX SPEC STEREO GUIDE 04/20/2023 [facility]   BREAST EXCISIONAL BIOPSY Bilateral    BREAST EXCISIONAL BIOPSY Left 06/11/2021   BREAST LUMPECTOMY Left 06/11/2021   BREAST LUMPECTOMY WITH RADIOACTIVE SEED LOCALIZATION Left 06/11/2021   Procedure: LEFT BREAST LUMPECTOMY WITH RADIOACTIVE SEED LOCALIZATION X2;  Surgeon: Katrina Dryer, MD;  Location: [facility];  Service: General;  Laterality: Left;   BREAST LUMPECTOMY WITH RADIOACTIVE SEED LOCALIZATION Left 06/22/2022   Procedure: LEFT BREAST LUMPECTOMY WITH RADIOACTIVE SEED LOCALIZATION;  Surgeon: Katrina Dryer, MD;  Location: [facility];  Service: General;  Laterality: Left;   BREAST SURGERY     CHOLECYSTECTOMY     COLONOSCOPY     CYSTOSCOPY W/ URETERAL STENT PLACEMENT Right 07/25/2016   Procedure: CYSTOSCOPY WITH RETROGRADE PYELOGRAM/LEFT URETERAL STENT PLACEMENT;  Surgeon: Katrina Banker, MD;  Location[facility];  Service: Urology;  Laterality: Right;   HAND SURGERY     ROUX-EN-Y GASTRIC BYPASS     07/15/2014    MEDICATIONS:  Accu-Chek Softclix Lancets lancets   ALBUTEROL IN   ALPRAZolam  (XANAX ) 0.25 MG tablet   amLODipine  (NORVASC ) 5 MG tablet   aspirin 81 MG chewable tablet   atorvastatin  (LIPITOR) 20 MG tablet   blood glucose meter kit and supplies KIT   Blood Pressure Monitoring (B-D ASSURE BPM/DELUXE  ARM CUFF) MISC   etodolac (LODINE) 400 MG tablet   ferrous sulfate  (FERROUSUL) 325 (65 FE) MG tablet   fluticasone  (FLONASE ) 50 MCG/ACT nasal spray   GAVILYTE-N WITH FLAVOR PACK 420 g solution   glucose blood (FREESTYLE TEST STRIPS) test strip   Multiple Vitamin (MULTIVITAMIN PO)   pantoprazole  (PROTONIX ) 40 MG tablet   Semaglutide , 2 MG/DOSE, 8 MG/3ML SOPN   tamoxifen  (NOLVADEX ) 20 MG tablet   traZODone (DESYREL) 50 MG tablet   valsartan (DIOVAN) 320 MG tablet   Vitamin D , Ergocalciferol , (DRISDOL ) 1.25 MG (50000 UNIT) CAPS capsule   No current facility-administered medications for this encounter.   Katrina Beard [facility] Short Stay/Anesthesiology [facility] Phone [phone] 09/01/2023 11:43 AM

## 2023-09-05 ENCOUNTER — Encounter (HOSPITAL_COMMUNITY): Admission: RE | Payer: Self-pay | Source: Ambulatory Visit

## 2023-09-05 ENCOUNTER — Ambulatory Visit (HOSPITAL_COMMUNITY): Admission: RE | Admit: 2023-09-05 | Source: Ambulatory Visit | Admitting: Orthopedic Surgery

## 2023-09-05 ENCOUNTER — Encounter (HOSPITAL_COMMUNITY): Payer: Self-pay | Admitting: Medical

## 2023-09-05 SURGERY — ARTHROPLASTY, KNEE, TOTAL
Anesthesia: Spinal | Site: Knee | Laterality: Left

## 2023-09-19 ENCOUNTER — Other Ambulatory Visit (HOSPITAL_COMMUNITY): Payer: Self-pay

## 2023-09-19 ENCOUNTER — Encounter (INDEPENDENT_AMBULATORY_CARE_PROVIDER_SITE_OTHER): Payer: Self-pay | Admitting: Adult Health

## 2023-09-19 ENCOUNTER — Telehealth (INDEPENDENT_AMBULATORY_CARE_PROVIDER_SITE_OTHER): Admitting: Adult Health

## 2023-09-19 VITALS — Ht 62.0 in

## 2023-09-19 DIAGNOSIS — Z6835 Body mass index (BMI) 35.0-35.9, adult: Secondary | ICD-10-CM

## 2023-09-19 DIAGNOSIS — M25562 Pain in left knee: Secondary | ICD-10-CM

## 2023-09-19 DIAGNOSIS — Z7985 Long-term (current) use of injectable non-insulin antidiabetic drugs: Secondary | ICD-10-CM

## 2023-09-19 DIAGNOSIS — E669 Obesity, unspecified: Secondary | ICD-10-CM

## 2023-09-19 DIAGNOSIS — Z6836 Body mass index (BMI) 36.0-36.9, adult: Secondary | ICD-10-CM

## 2023-09-19 DIAGNOSIS — I152 Hypertension secondary to endocrine disorders: Secondary | ICD-10-CM | POA: Diagnosis not present

## 2023-09-19 DIAGNOSIS — E1169 Type 2 diabetes mellitus with other specified complication: Secondary | ICD-10-CM | POA: Diagnosis not present

## 2023-09-19 DIAGNOSIS — E559 Vitamin D deficiency, unspecified: Secondary | ICD-10-CM | POA: Diagnosis not present

## 2023-09-19 DIAGNOSIS — G8929 Other chronic pain: Secondary | ICD-10-CM

## 2023-09-19 DIAGNOSIS — E1159 Type 2 diabetes mellitus with other circulatory complications: Secondary | ICD-10-CM

## 2023-09-19 MED ORDER — VITAMIN D (ERGOCALCIFEROL) 1.25 MG (50000 UNIT) PO CAPS
50000.0000 [IU] | ORAL_CAPSULE | ORAL | 0 refills | Status: DC
  Filled 2023-09-19 – 2023-09-23 (×2): qty 4, 28d supply, fill #0

## 2023-09-19 NOTE — Progress Notes (Signed)
 WEIGHT SUMMARY AND BIOMETRICS  Vitals Temp: -- (Video Visit) BP: -- (Video Visit) Pulse Rate: -- (Video Visit) SpO2: -- (Video Visit)   Anthropometric Measurements Height: 5' 2 (1.575 m) Weight: -- (Video Visit) Weight at Last Visit: 200lb Weight Lost Since Last Visit: Video Visit Weight Gained Since Last Visit: Video Visit Starting Weight: 318lb Total Weight Loss (lbs): 32 lb (14.5 kg)   Body Composition  Body Fat %: -- (Video Visit) Fat Mass (lbs): -- (Video Visit) Muscle Mass (lbs): -- (Video Visit) Total Body Water (lbs): -- (Video Visit) Visceral Fat Rating : -- (Video Visit)   Other Clinical Data A1c: -- (Video Visit) RMR: -- (Video Visit) Fasting: no Labs: no Today's Visit #: 62 Starting Date: 04/03/19 Comments: Video Visit    Chief Complaint:  I connected with  Katrina Beard on 09/19/23 by a video and audio enabled telemedicine application and verified that I am speaking with the correct person using two identifiers.  Patient Location: Home  Provider Location: Office/Clinic  I discussed the limitations of evaluation and management by telemedicine. The patient expressed understanding and agreed to proceed.  OBESITY Katrina Beard is here to discuss her progress with her obesity treatment plan.  She is on the practicing portion control and making smarter food choices, such as increasing vegetables and decreasing simple carbohydrates and states she is following her eating plan approximately 70 % of the time.  She states she is exercising: NEAT Activities  Interim History:  Pt does not weigh at home Last recorded weight in office  08/22/23 09:00  Weight 200 lb (90.7 kg)  BMI (Calculated) 36.57  Total Weight Loss (lbs) 32 lb (14.5 kg)   [name] was scheduled for L TKA on 09/05/2023 Procedure cancelled due to out of pocket cost. Per pt, she was asked to pay $1100 at time of surgery- she was unaware of this large cost and cancelled the joint  replacement.  Stress-  09/05/2023 L TKA cancelled due to out of pocket cost  Her [facility] License lapsed and her assigned foster child was reassigned for several weeks until her credentialing was updated. The child has moved home.  Her landlord has informed her that she needs to vacate the premise 2- 3 months.  Landlord is planning on selling the home this summer.  Finances are tight over summer months due to [employer] schools out on break  Subjective:   1. Hypertension associated with type 2 diabetes mellitus (HCC) She is currently taking amLODipine  (NORVASC ) 5 MG tablet  aspirin 81 MG chewable tablet  atorvastatin  (LIPITOR) 20 MG tablet  valsartan (DIOVAN) 320 MG tablet   2. Type 2 diabetes mellitus with obesity (HCC) Lab Results  Component Value Date   HGBA1C 6.0 (H) 08/22/2023   HGBA1C 5.9 (H) 04/11/2023   HGBA1C 6.0 (H) 12/02/2022    She is on weekly Ozempic  2mg  Denies mass in neck, dysphagia, dyspepsia, persistent hoarseness, abdominal pain, or N/V/C  Fasting CBG will range from low 90s to low 160s She denies sx's of hypoglycemia  3. Vitamin D  deficiency  Latest Reference Range & Units 12/02/22 12:31 04/11/23 12:39 08/22/23 10:34  Vitamin D , 25-Hydroxy 30.0 - 100.0 ng/mL 58.8 50.7 49.9   She is on weekly Ergocalciferol - denies N/V/Muscle Weakness  4. Chronic pain of left knee   Assessment/Plan:   1. Hypertension associated with type 2 diabetes mellitus (HCC) Limit sugar/simple CHO Increase protein intake  2. Type 2 diabetes mellitus with obesity (HCC) (Primary) Limit  sugar/simple CHO Increase protein intake  3. Vitamin D  deficiency Refill  Vitamin D , Ergocalciferol , (DRISDOL ) 1.25 MG (50000 UNIT) CAPS capsule Take 1 capsule (50,000 Units total) by mouth every 7 (seven) days. Dispense: 4 capsule, Refills: 0 ordered   4. Chronic pain of left knee MyChart Message sent with local Orthopedic Practices  5. BMI 35.0-35.9,adult- Estimated BMI 36.57  Katrina Beard is  currently in the action stage of change. As such, her goal is to continue with weight loss efforts. She has agreed to practicing portion control and making smarter food choices, such as increasing vegetables and decreasing simple carbohydrates.   Exercise goals: No exercise has been prescribed at this time.  Behavioral modification strategies: increasing lean protein intake, decreasing simple carbohydrates, increasing vegetables, increasing water intake, no skipping meals, meal planning and cooking strategies, keeping healthy foods in the home, ways to avoid boredom eating, and planning for success.  Katrina Beard has agreed to follow-up with our clinic in 4 weeks. She was informed of the importance of frequent follow-up visits to maximize her success with intensive lifestyle modifications for her multiple health conditions.   Objective:   Height 5' 2 (1.575 m), last menstrual period 11/09/2021. Body mass index is 36.4 kg/m.  General: Cooperative, alert, well developed, in no acute distress. HEENT: Conjunctivae and lids unremarkable. Cardiovascular: Regular rhythm.  Lungs: Normal work of breathing. Neurologic: No focal deficits.   Lab Results  Component Value Date   CREATININE 0.91 08/22/2023   BUN 12 08/22/2023   NA 142 08/22/2023   K 3.9 08/22/2023   CL 104 08/22/2023   CO2 22 08/22/2023   Lab Results  Component Value Date   ALT 35 (H) 08/22/2023   AST 29 08/22/2023   ALKPHOS 122 (H) 08/22/2023   BILITOT 0.6 08/22/2023   Lab Results  Component Value Date   HGBA1C 6.0 (H) 08/22/2023   HGBA1C 5.9 (H) 04/11/2023   HGBA1C 6.0 (H) 12/02/2022   HGBA1C 5.7 (H) 03/25/2022   HGBA1C 5.2 10/01/2021   Lab Results  Component Value Date   INSULIN  6.7 08/22/2023   INSULIN  5.5 04/11/2023   INSULIN  6.4 12/02/2022   INSULIN  5.2 03/25/2022   INSULIN  6.1 10/01/2021   Lab Results  Component Value Date   TSH 0.575 04/03/2019   Lab Results  Component Value Date   CHOL 161 04/15/2021    HDL 55 04/15/2021   LDLCALC 91 04/15/2021   TRIG 80 04/15/2021   Lab Results  Component Value Date   VD25OH 49.9 08/22/2023   VD25OH 50.7 04/11/2023   VD25OH 58.8 12/02/2022   Lab Results  Component Value Date   WBC 5.6 08/29/2023   HGB 12.8 08/29/2023   HCT 40.0 08/29/2023   MCV 95.0 08/29/2023   PLT 209 08/29/2023   Lab Results  Component Value Date   IRON 162 04/15/2021   TIBC 252 06/24/2020   FERRITIN 64 06/24/2020   Attestation Statements:   Reviewed by clinician on day of visit: allergies, medications, problem list, medical history, surgical history, family history, social history, and previous encounter notes.  I have reviewed the above documentation for accuracy and completeness, and I agree with the above. -  Asir Bingley d. Gedalia Mcmillon, NP-C

## 2023-09-23 ENCOUNTER — Other Ambulatory Visit (HOSPITAL_COMMUNITY): Payer: Self-pay

## 2023-11-30 ENCOUNTER — Other Ambulatory Visit: Payer: Self-pay

## 2023-11-30 ENCOUNTER — Encounter (INDEPENDENT_AMBULATORY_CARE_PROVIDER_SITE_OTHER): Payer: Self-pay | Admitting: Adult Health

## 2023-11-30 ENCOUNTER — Other Ambulatory Visit (HOSPITAL_COMMUNITY): Payer: Self-pay

## 2023-11-30 ENCOUNTER — Ambulatory Visit (INDEPENDENT_AMBULATORY_CARE_PROVIDER_SITE_OTHER): Admitting: Adult Health

## 2023-11-30 VITALS — BP 130/81 | HR 71 | Temp 98.3°F | Ht 62.0 in | Wt 199.0 lb

## 2023-11-30 DIAGNOSIS — G8929 Other chronic pain: Secondary | ICD-10-CM | POA: Diagnosis not present

## 2023-11-30 DIAGNOSIS — E1159 Type 2 diabetes mellitus with other circulatory complications: Secondary | ICD-10-CM

## 2023-11-30 DIAGNOSIS — Z6836 Body mass index (BMI) 36.0-36.9, adult: Secondary | ICD-10-CM

## 2023-11-30 DIAGNOSIS — E559 Vitamin D deficiency, unspecified: Secondary | ICD-10-CM

## 2023-11-30 DIAGNOSIS — M25562 Pain in left knee: Secondary | ICD-10-CM

## 2023-11-30 DIAGNOSIS — I152 Hypertension secondary to endocrine disorders: Secondary | ICD-10-CM

## 2023-11-30 DIAGNOSIS — E1169 Type 2 diabetes mellitus with other specified complication: Secondary | ICD-10-CM

## 2023-11-30 DIAGNOSIS — E669 Obesity, unspecified: Secondary | ICD-10-CM

## 2023-11-30 DIAGNOSIS — Z7985 Long-term (current) use of injectable non-insulin antidiabetic drugs: Secondary | ICD-10-CM

## 2023-11-30 MED ORDER — VITAMIN D (ERGOCALCIFEROL) 1.25 MG (50000 UNIT) PO CAPS
50000.0000 [IU] | ORAL_CAPSULE | ORAL | 0 refills | Status: DC
  Filled 2023-11-30: qty 4, 28d supply, fill #0

## 2023-11-30 MED ORDER — SEMAGLUTIDE (2 MG/DOSE) 8 MG/3ML ~~LOC~~ SOPN
2.0000 mg | PEN_INJECTOR | SUBCUTANEOUS | 0 refills | Status: DC
  Filled 2023-11-30: qty 9, 84d supply, fill #0

## 2023-11-30 MED ORDER — METFORMIN HCL 500 MG PO TABS
500.0000 mg | ORAL_TABLET | Freq: Every day | ORAL | 0 refills | Status: DC
  Filled 2023-11-30: qty 90, 90d supply, fill #0

## 2023-11-30 NOTE — Progress Notes (Signed)
 WEIGHT SUMMARY AND BIOMETRICS  Vitals Temp: 98.3 F (36.8 C) BP: 130/81 Pulse Rate: 71 SpO2: 98 %   Anthropometric Measurements Height: 5' 2 (1.575 m) Weight: 199 lb (90.3 kg) BMI (Calculated): 36.39 Weight at Last Visit: 200lb Weight Lost Since Last Visit: 1lb Weight Gained Since Last Visit: 0lb Starting Weight: 318 Total Weight Loss (lbs): 119 lb (54 kg)   Body Composition  Body Fat %: 48.9 % Fat Mass (lbs): 97.8 lbs Muscle Mass (lbs): 96.8 lbs Total Body Water (lbs): 79.8 lbs Visceral Fat Rating : 14   Other Clinical Data Fasting: No Labs: no Today's Visit #: 100 Starting Date: 04/03/19    Chief Complaint:   OBESITY Katrina Beard is here to discuss her progress with her obesity treatment plan.  She is on the practicing portion control and making smarter food choices, such as increasing vegetables and decreasing simple carbohydrates and states she is following her eating plan approximately 80 % of the time.  She states she is exercising: NEAT Activities  Interim History:  Last OV at [facility] was via MyChart Video visit on 09/19/2023  She has not contacted her established Orthopedic Surgeon, re: chronic L knee pain- L TKR  She continues to look for suitable housing-   Her one foster daughter was delayed with school matriculation due to sluggish paperwork from [employer].  She stared Sept at [employer]. Her foster daughter skipped classes and earned a 10 day out school suspension  Subjective:   1. Chronic pain of left knee Ms. Moller was scheduled for L TKA on 09/05/2023 Procedure cancelled due to out of pocket cost. Per pt, she was asked to pay $1100 at time of surgery- she was unaware of this large cost and cancelled the joint replacement.   2. Hypertension associated with type 2 diabetes mellitus (HCC) BP stable at OV CLARIFIED ANTIHYPERTENSIVES MANAGED BY PCP: amLODipine  (NORVASC ) 5 MG tablet  aspirin 81 MG chewable tablet  atorvastatin   (LIPITOR) 20 MG tablet  valsartan (DIOVAN) 320 MG tablet   3. Type 2 diabetes mellitus with obesity (HCC) Lab Results  Component Value Date   HGBA1C 6.0 (H) 08/22/2023   HGBA1C 5.9 (H) 04/11/2023   HGBA1C 6.0 (H) 12/02/2022     Latest Reference Range & Units 08/22/23 10:34  eGFR >59 mL/min/1.73 72   Home fasting CBG will range upper 90s to low 100s She denies sx's of hypoglycemia She is currently on weekly max dose ozempic  2mg  She is frustrated with lack of weight loss.  Antidiabetic hx: Januvia 100mg   Jardiance  10mg   Metformin  therapy stopped March 2023 Wegovy  then converted to Ozempc therapy  05/27/2022 Wegovy  2.4 replaced with Mounjaro  7.5mg  once weekly injection  07/22/2022 Mounjaro  7.5mg  increase to 10mg  due to worsening polyphagia 11/01/2022- pt requested stopping Mounjaro  10mg  and restart Ozempic  2mg   Recorded weight when on Mounjaro  10mg  195 lbs Recorded weight when on Wegovy  2.4mg   189 lbs Weight today on Ozempic  2mg  199 lbs  Discussed the principals of a non responding to GLP-1 or GIP/GLP-1 therapy  She is agreeable to add on daily Metformin  500mg - instructed to take with full meal  4. Vitamin D  deficiency  Latest Reference Range & Units 08/22/23 10:34  Vitamin D , 25-Hydroxy 30.0 - 100.0 ng/mL 49.9   She endorses chronic fatigue, r/t to emotional stress with her foster daughters misbehavior She is on weekly Ergocalciferol  She denies N/V/Muscle Weakness  Assessment/Plan:   1. Chronic pain of left knee Continue with weight loss  efforts Chair/Seated Exercises F/u with established Orthopod  2. Hypertension associated with type 2 diabetes mellitus (HCC) (Primary) Continue amLODipine  (NORVASC ) 5 MG tablet  aspirin 81 MG chewable tablet  atorvastatin  (LIPITOR) 20 MG tablet  valsartan (DIOVAN) 320 MG tablet   3. Type 2 diabetes mellitus with obesity (HCC) Start []   metFORMIN  (GLUCOPHAGE ) 500 MG tablet Take 1 tablet (500 mg total) by mouth daily with lunch.  Dispense: 90 tablet, Refills: 0 of 0 remaining   Refill  Semaglutide , 2 MG/DOSE, 8 MG/3ML SOPN Inject 2 mg into the skin once a week. Dispense: 9 mL, Refills: 0 ordered   4. Vitamin D  deficiency Refill  Vitamin D , Ergocalciferol , (DRISDOL ) 1.25 MG (50000 UNIT) CAPS capsule Take 1 capsule (50,000 Units total) by mouth every 7 (seven) days. Dispense: 4 capsule, Refills: 0 ordered   5. Current BMI 36.5  Zeriyah is not currently in the action stage of change. As such, her goal is to get back to weightloss efforts . She has agreed to practicing portion control and making smarter food choices, such as increasing vegetables and decreasing simple carbohydrates.   Exercise goals: Seated Exercises- YouTube or TicToc  Behavioral modification strategies: increasing lean protein intake, decreasing simple carbohydrates, increasing vegetables, increasing water intake, meal planning and cooking strategies, keeping healthy foods in the home, ways to avoid boredom eating, ways to avoid night time snacking, better snacking choices, emotional eating strategies, planning for success, and decreasing junk food.  Shevaun has agreed to follow-up with our clinic in 4 weeks. She was informed of the importance of frequent follow-up visits to maximize her success with intensive lifestyle modifications for her multiple health conditions.   Check Fasting Labs Fall 2025.  Objective:   Blood pressure 130/81, pulse 71, temperature 98.3 F (36.8 C), height 5' 2 (1.575 m), weight 199 lb (90.3 kg), last menstrual period 11/09/2021, SpO2 98%. Body mass index is 36.4 kg/m.  General: Cooperative, alert, well developed, in no acute distress. HEENT: Conjunctivae and lids unremarkable. Cardiovascular: Regular rhythm.  Lungs: Normal work of breathing. Neurologic: No focal deficits.   Lab Results  Component Value Date   CREATININE 0.91 08/22/2023   BUN 12 08/22/2023   NA 142 08/22/2023   K 3.9 08/22/2023   CL 104  08/22/2023   CO2 22 08/22/2023   Lab Results  Component Value Date   ALT 35 (H) 08/22/2023   AST 29 08/22/2023   ALKPHOS 122 (H) 08/22/2023   BILITOT 0.6 08/22/2023   Lab Results  Component Value Date   HGBA1C 6.0 (H) 08/22/2023   HGBA1C 5.9 (H) 04/11/2023   HGBA1C 6.0 (H) 12/02/2022   HGBA1C 5.7 (H) 03/25/2022   HGBA1C 5.2 10/01/2021   Lab Results  Component Value Date   INSULIN  6.7 08/22/2023   INSULIN  5.5 04/11/2023   INSULIN  6.4 12/02/2022   INSULIN  5.2 03/25/2022   INSULIN  6.1 10/01/2021   Lab Results  Component Value Date   TSH 0.575 04/03/2019   Lab Results  Component Value Date   CHOL 161 04/15/2021   HDL 55 04/15/2021   LDLCALC 91 04/15/2021   TRIG 80 04/15/2021   Lab Results  Component Value Date   VD25OH 49.9 08/22/2023   VD25OH 50.7 04/11/2023   VD25OH 58.8 12/02/2022   Lab Results  Component Value Date   WBC 5.6 08/29/2023   HGB 12.8 08/29/2023   HCT 40.0 08/29/2023   MCV 95.0 08/29/2023   PLT 209 08/29/2023   Lab Results  Component Value Date  IRON 162 04/15/2021   TIBC 252 06/24/2020   FERRITIN 64 06/24/2020   Attestation Statements:   Reviewed by clinician on day of visit: allergies, medications, problem list, medical history, surgical history, family history, social history, and previous encounter notes.  I have reviewed the above documentation for accuracy and completeness, and I agree with the above. -  Naresh Althaus d. Gwenivere Hiraldo, NP-C

## 2023-12-28 ENCOUNTER — Ambulatory Visit (INDEPENDENT_AMBULATORY_CARE_PROVIDER_SITE_OTHER): Admitting: Adult Health

## 2023-12-28 ENCOUNTER — Other Ambulatory Visit (HOSPITAL_COMMUNITY): Payer: Self-pay

## 2023-12-28 ENCOUNTER — Encounter (INDEPENDENT_AMBULATORY_CARE_PROVIDER_SITE_OTHER): Payer: Self-pay | Admitting: Adult Health

## 2023-12-28 VITALS — BP 131/81 | HR 76 | Temp 98.3°F | Ht 62.0 in | Wt 198.0 lb

## 2023-12-28 DIAGNOSIS — M25562 Pain in left knee: Secondary | ICD-10-CM

## 2023-12-28 DIAGNOSIS — E559 Vitamin D deficiency, unspecified: Secondary | ICD-10-CM

## 2023-12-28 DIAGNOSIS — Z7985 Long-term (current) use of injectable non-insulin antidiabetic drugs: Secondary | ICD-10-CM

## 2023-12-28 DIAGNOSIS — E1159 Type 2 diabetes mellitus with other circulatory complications: Secondary | ICD-10-CM | POA: Diagnosis not present

## 2023-12-28 DIAGNOSIS — G8929 Other chronic pain: Secondary | ICD-10-CM | POA: Diagnosis not present

## 2023-12-28 DIAGNOSIS — Z6836 Body mass index (BMI) 36.0-36.9, adult: Secondary | ICD-10-CM

## 2023-12-28 DIAGNOSIS — Z7984 Long term (current) use of oral hypoglycemic drugs: Secondary | ICD-10-CM

## 2023-12-28 DIAGNOSIS — Z6835 Body mass index (BMI) 35.0-35.9, adult: Secondary | ICD-10-CM

## 2023-12-28 DIAGNOSIS — I152 Hypertension secondary to endocrine disorders: Secondary | ICD-10-CM

## 2023-12-28 DIAGNOSIS — E669 Obesity, unspecified: Secondary | ICD-10-CM

## 2023-12-28 MED ORDER — VITAMIN D (ERGOCALCIFEROL) 1.25 MG (50000 UNIT) PO CAPS: 50000.0000 [IU] | ORAL_CAPSULE | ORAL | 0 refills | Status: DC

## 2023-12-28 MED ORDER — VITAMIN D (ERGOCALCIFEROL) 1.25 MG (50000 UNIT) PO CAPS
50000.0000 [IU] | ORAL_CAPSULE | ORAL | 0 refills | Status: DC
  Filled 2023-12-28: qty 4, 28d supply, fill #0

## 2023-12-28 NOTE — Progress Notes (Signed)
 WEIGHT SUMMARY AND BIOMETRICS  Vitals Temp: 98.3 F (36.8 C) BP: 131/81 Pulse Rate: 76 SpO2: 100 %   Anthropometric Measurements Height: 5' 2 (1.575 m) Weight: 198 lb (89.8 kg) BMI (Calculated): 36.21 Weight at Last Visit: 199 lb Weight Lost Since Last Visit: 1 lb Weight Gained Since Last Visit: 0 Starting Weight: 231 lb Total Weight Loss (lbs): 33 lb (15 kg)   Body Composition  Body Fat %: 47.7 % Fat Mass (lbs): 94.6 lbs Muscle Mass (lbs): 98.4 lbs Total Body Water (lbs): 76.2 lbs Visceral Fat Rating : 14   Other Clinical Data Fasting: no Labs: no Today's Visit #: 65 Starting Date: 04/03/19    Chief Complaint:   OBESITY Katrina Beard is here to discuss her progress with her obesity treatment plan.  She is on the practicing portion control and making smarter food choices, such as increasing vegetables and decreasing simple carbohydrates and states she is following her eating plan approximately 85 % of the time.  She states she is exercising: None  Interim History:   Stress-  1) Her one foster child (freshmen in high school) has become unruly and recently suspended from school. 2) She has asked that her foster child be removed from her home- this has created a toxic home environment. 3) She does not feel well supported by this child's Case Manager 4) She needs to move homes and is concerned about finances 5) Continued chronic L Knee pain- has not rescheduled L TKR  Exercise-None  Hydration-she estimates to drink at least 2 large bottles per day  Subjective:   1. Chronic pain of left knee Katrina Beard was scheduled for L TKA on 09/05/2023 Procedure cancelled due to out of pocket cost. Per pt, she was asked to pay $1100 at time of surgery- she was unaware of this large cost and cancelled the joint replacement.   She reports constant L Knee pain, this has impended her overall mobility  2. Hypertension associated with type 2 diabetes mellitus (HCC) Initial  BP elevated Recheck BP much improved She reports stress and anxiety r/t learning that her current foster daughter will not be removed from her home today. She denies CP at present. She is currently on: amLODipine  (NORVASC ) 5 MG tablet  aspirin 81 MG chewable tablet  atorvastatin  (LIPITOR) 20 MG tablet  valsartan (DIOVAN) 320 MG tablet   3. Vitamin D  deficiency  Latest Reference Range & Units 12/02/22 12:31 04/11/23 12:39 08/22/23 10:34  Vitamin D , 25-Hydroxy 30.0 - 100.0 ng/mL 58.8 50.7 49.9   She is on weekly Ergocalciferol - denies N/V/Muscle Weakness  Assessment/Plan:   1. Chronic pain of left knee (Primary) Limit simple CHO/sugar Increase lean protein F/u with Orthopedic specialist as needed/directed  2. Hypertension associated with type 2 diabetes mellitus (HCC) Limit Na+ intake Continue  amLODipine  (NORVASC ) 5 MG tablet  aspirin 81 MG chewable tablet  atorvastatin  (LIPITOR) 20 MG tablet  valsartan (DIOVAN) 320 MG tablet   3. Vitamin D  deficiency Refill  Vitamin D , Ergocalciferol , (DRISDOL ) 1.25 MG (50000 UNIT) CAPS capsule Take 1 capsule (50,000 Units total) by mouth every 7 (seven) days. Dispense: 4 capsule, Refills: 0 ordered   5. Current BMI 36.3  Katrina Beard is currently in the action stage of change. As such, her goal is to continue with weight loss efforts. She has agreed to practicing portion control and making smarter food choices, such as increasing vegetables and decreasing simple carbohydrates.   Exercise goals: All adults should avoid inactivity. Some physical  activity is better than none, and adults who participate in any amount of physical activity gain some health benefits. Adults should also include muscle-strengthening activities that involve all major muscle groups on 2 or more days a week. Seated Chair Exercise- 3 times weekly (at least 5 mins in duration- YouTube).  Behavioral modification strategies: increasing lean protein intake, decreasing simple  carbohydrates, increasing vegetables, increasing water intake, decreasing eating out, no skipping meals, meal planning and cooking strategies, keeping healthy foods in the home, and planning for success.  Katrina Beard has agreed to follow-up with our clinic in 4 weeks. She was informed of the importance of frequent follow-up visits to maximize her success with intensive lifestyle modifications for her multiple health conditions.   Check Fasting Labs at next OV  Objective:   Blood pressure 131/81, pulse 76, temperature 98.3 F (36.8 C), height 5' 2 (1.575 m), weight 198 lb (89.8 kg), last menstrual period 11/09/2021, SpO2 100%. Body mass index is 36.21 kg/m.  General: Cooperative, alert, well developed, in no acute distress. HEENT: Conjunctivae and lids unremarkable. Cardiovascular: Regular rhythm.  Lungs: Normal work of breathing. Neurologic: No focal deficits.   Lab Results  Component Value Date   CREATININE 0.91 08/22/2023   BUN 12 08/22/2023   NA 142 08/22/2023   K 3.9 08/22/2023   CL 104 08/22/2023   CO2 22 08/22/2023   Lab Results  Component Value Date   ALT 35 (H) 08/22/2023   AST 29 08/22/2023   ALKPHOS 122 (H) 08/22/2023   BILITOT 0.6 08/22/2023   Lab Results  Component Value Date   HGBA1C 6.0 (H) 08/22/2023   HGBA1C 5.9 (H) 04/11/2023   HGBA1C 6.0 (H) 12/02/2022   HGBA1C 5.7 (H) 03/25/2022   HGBA1C 5.2 10/01/2021   Lab Results  Component Value Date   INSULIN  6.7 08/22/2023   INSULIN  5.5 04/11/2023   INSULIN  6.4 12/02/2022   INSULIN  5.2 03/25/2022   INSULIN  6.1 10/01/2021   Lab Results  Component Value Date   TSH 0.575 04/03/2019   Lab Results  Component Value Date   CHOL 161 04/15/2021   HDL 55 04/15/2021   LDLCALC 91 04/15/2021   TRIG 80 04/15/2021   Lab Results  Component Value Date   VD25OH 49.9 08/22/2023   VD25OH 50.7 04/11/2023   VD25OH 58.8 12/02/2022   Lab Results  Component Value Date   WBC 5.6 08/29/2023   HGB 12.8 08/29/2023   HCT  40.0 08/29/2023   MCV 95.0 08/29/2023   PLT 209 08/29/2023   Lab Results  Component Value Date   IRON 162 04/15/2021   TIBC 252 06/24/2020   FERRITIN 64 06/24/2020   Attestation Statements:   Reviewed by clinician on day of visit: allergies, medications, problem list, medical history, surgical history, family history, social history, and previous encounter notes.  I have reviewed the above documentation for accuracy and completeness, and I agree with the above. -  Esaw Knippel d. Dmiya Malphrus, NP-C

## 2024-01-31 ENCOUNTER — Ambulatory Visit (INDEPENDENT_AMBULATORY_CARE_PROVIDER_SITE_OTHER): Payer: Self-pay | Admitting: Adult Health

## 2024-01-31 ENCOUNTER — Encounter (INDEPENDENT_AMBULATORY_CARE_PROVIDER_SITE_OTHER): Payer: Self-pay | Admitting: Adult Health

## 2024-01-31 VITALS — BP 136/66 | HR 70 | Temp 98.7°F | Ht 62.0 in | Wt 196.0 lb

## 2024-01-31 DIAGNOSIS — I152 Hypertension secondary to endocrine disorders: Secondary | ICD-10-CM

## 2024-01-31 DIAGNOSIS — E1169 Type 2 diabetes mellitus with other specified complication: Secondary | ICD-10-CM | POA: Diagnosis not present

## 2024-01-31 DIAGNOSIS — E1159 Type 2 diabetes mellitus with other circulatory complications: Secondary | ICD-10-CM | POA: Diagnosis not present

## 2024-01-31 DIAGNOSIS — Z7984 Long term (current) use of oral hypoglycemic drugs: Secondary | ICD-10-CM

## 2024-01-31 DIAGNOSIS — Z6836 Body mass index (BMI) 36.0-36.9, adult: Secondary | ICD-10-CM

## 2024-01-31 DIAGNOSIS — E669 Obesity, unspecified: Secondary | ICD-10-CM

## 2024-01-31 DIAGNOSIS — E559 Vitamin D deficiency, unspecified: Secondary | ICD-10-CM

## 2024-01-31 DIAGNOSIS — M25562 Pain in left knee: Secondary | ICD-10-CM

## 2024-01-31 DIAGNOSIS — G8929 Other chronic pain: Secondary | ICD-10-CM

## 2024-01-31 DIAGNOSIS — Z0000 Encounter for general adult medical examination without abnormal findings: Secondary | ICD-10-CM

## 2024-01-31 MED ORDER — METFORMIN HCL 500 MG PO TABS: 500.0000 mg | ORAL_TABLET | Freq: Every day | ORAL | 0 refills | Status: AC

## 2024-01-31 NOTE — Progress Notes (Signed)
 WEIGHT SUMMARY AND BIOMETRICS  Vitals Temp: 98.7 F (37.1 C) BP: 136/66 Pulse Rate: 70 SpO2: 100 %   Anthropometric Measurements Height: 5' 2 (1.575 m) Weight: 196 lb (88.9 kg) BMI (Calculated): 35.84 Weight at Last Visit: 198lb Weight Lost Since Last Visit: 2lb Weight Gained Since Last Visit: 0lb Starting Weight: 231lb Total Weight Loss (lbs): 35 lb (15.9 kg)   Body Composition  Body Fat %: 46.7 % Fat Mass (lbs): 91.8 lbs Muscle Mass (lbs): 99.4 lbs Total Body Water (lbs): 75.6 lbs Visceral Fat Rating : 14   Other Clinical Data Fasting: Yes Labs: Yes Today's Visit #: 27 Starting Date: 04/03/19    Chief Complaint:   OBESITY Katrina Beard is here to discuss her progress with her obesity treatment plan.  She is on the practicing portion control and making smarter food choices, such as increasing vegetables and decreasing simple carbohydrates and states she is following her eating plan approximately 75 % of the time.  She states she is exercising Walking 20 minutes 1 times per week.  Interim History:  She has tolerated recent addition of Metformin  500mg - takes at lunch She has continued on weekly max dose Ozempic  2mg  She has not been checking home CBG- however she denies sx's of hypoglycemia  She continues to experience stress at home r/t to her foster child. She was suspended again for 3 days from high school. This foster child may soon be leveled up and sent to a group home for more intensive supervision/care.  Subjective:   1. Type 2 diabetes mellitus with other specified complication, without long-term current use of insulin  (HCC) Home fasting CBG will range upper 90s to low 100s She denies sx's of hypoglycemia She is currently on weekly max dose ozempic  2mg  She is frustrated with lack of weight loss.   Antidiabetic hx: Januvia 100mg   Jardiance  10mg   Metformin  therapy stopped March 2023 Wegovy  then converted to Ozempc therapy  05/27/2022 Wegovy  2.4  replaced with Mounjaro  7.5mg  once weekly injection  07/22/2022 Mounjaro  7.5mg  increase to 10mg  due to worsening polyphagia 11/01/2022- pt requested stopping Mounjaro  10mg  and restart Ozempic  2mg    Recorded weight when on Mounjaro  10mg  195 lbs Recorded weight when on Wegovy  2.4mg   189 lbs Restarted daily Metformin  500mg  on/about 11/30/2023  Weight today on Ozempic  2mg  196 lbs  2. Hypertension associated with type 2 diabetes mellitus (HCC) BP stable at OV She is on  amLODipine  (NORVASC ) 5 MG tablet  aspirin 81 MG chewable tablet  atorvastatin  (LIPITOR) 20 MG tablet  valsartan (DIOVAN) 320 MG tablet   3. Vitamin D  deficiency  Latest Reference Range & Units 12/02/22 12:31 04/11/23 12:39 08/22/23 10:34  Vitamin D , 25-Hydroxy 30.0 - 100.0 ng/mL 58.8 50.7 49.9   She reports stable energy levels  4. Healthcare maintenance She reports stable energy levels She feels that her sleep is adequate  5. Chronic pain of left knee Ms. Jiggetts was scheduled for L TKA on 09/05/2023 Procedure cancelled due to out of pocket cost. Per pt, she was asked to pay $1100 at time of surgery- she was unaware of this large cost and cancelled the joint replacement.   She reports constant L Knee pain, this has impended her overall mobility   Assessment/Plan:   1. Type 2 diabetes mellitus with other specified complication, without long-term current use of insulin  (HCC) Check Labs - Hemoglobin A1c - Insulin , random - Mg++ Refill   metFORMIN  (GLUCOPHAGE ) 500 MG tablet Take 1 tablet (500 mg total) by  mouth daily with lunch. Dispense: 90 tablet, Refills: 0 ordered   2. Hypertension associated with type 2 diabetes mellitus (HCC) Check Labs - Comprehensive metabolic panel with GFR  3. Vitamin D  deficiency Check Labs - VITAMIN D  25 Hydroxy (Vit-D Deficiency, Fractures)  4. Healthcare maintenance Check Labs - TSH + free T4  5. Chronic pain of left knee Continue with weight loss efforts  6. Current BMI 36.0  (Primary)  Katrina Beard is currently in the action stage of change. As such, her goal is to continue with weight loss efforts. She has agreed to practicing portion control and making smarter food choices, such as increasing vegetables and decreasing simple carbohydrates.   Exercise goals: All adults should avoid inactivity. Some physical activity is better than none, and adults who participate in any amount of physical activity gain some health benefits. Adults should also include muscle-strengthening activities that involve all major muscle groups on 2 or more days a week.  Behavioral modification strategies: increasing lean protein intake, decreasing simple carbohydrates, increasing vegetables, increasing water intake, no skipping meals, meal planning and cooking strategies, keeping healthy foods in the home, ways to avoid boredom eating, planning for success, and decreasing junk food.  Katrina Beard has agreed to follow-up with our clinic in 4 weeks. She was informed of the importance of frequent follow-up visits to maximize her success with intensive lifestyle modifications for her multiple health conditions.   Objective:   Blood pressure 136/66, pulse 70, temperature 98.7 F (37.1 C), height 5' 2 (1.575 m), weight 196 lb (88.9 kg), last menstrual period 11/09/2021, SpO2 100%. Body mass index is 35.85 kg/m.  General: Cooperative, alert, well developed, in no acute distress. HEENT: Conjunctivae and lids unremarkable. Cardiovascular: Regular rhythm.  Lungs: Normal work of breathing. Neurologic: No focal deficits.   Lab Results  Component Value Date   CREATININE 0.91 08/22/2023   BUN 12 08/22/2023   NA 142 08/22/2023   K 3.9 08/22/2023   CL 104 08/22/2023   CO2 22 08/22/2023   Lab Results  Component Value Date   ALT 35 (H) 08/22/2023   AST 29 08/22/2023   ALKPHOS 122 (H) 08/22/2023   BILITOT 0.6 08/22/2023   Lab Results  Component Value Date   HGBA1C 6.0 (H) 08/22/2023   HGBA1C 5.9 (H)  04/11/2023   HGBA1C 6.0 (H) 12/02/2022   HGBA1C 5.7 (H) 03/25/2022   HGBA1C 5.2 10/01/2021   Lab Results  Component Value Date   INSULIN  6.7 08/22/2023   INSULIN  5.5 04/11/2023   INSULIN  6.4 12/02/2022   INSULIN  5.2 03/25/2022   INSULIN  6.1 10/01/2021   Lab Results  Component Value Date   TSH 0.575 04/03/2019   Lab Results  Component Value Date   CHOL 161 04/15/2021   HDL 55 04/15/2021   LDLCALC 91 04/15/2021   TRIG 80 04/15/2021   Lab Results  Component Value Date   VD25OH 49.9 08/22/2023   VD25OH 50.7 04/11/2023   VD25OH 58.8 12/02/2022   Lab Results  Component Value Date   WBC 5.6 08/29/2023   HGB 12.8 08/29/2023   HCT 40.0 08/29/2023   MCV 95.0 08/29/2023   PLT 209 08/29/2023   Lab Results  Component Value Date   IRON 162 04/15/2021   TIBC 252 06/24/2020   FERRITIN 64 06/24/2020   Attestation Statements:   Reviewed by clinician on day of visit: allergies, medications, problem list, medical history, surgical history, family history, social history, and previous encounter notes.  I have reviewed the above  documentation for accuracy and completeness, and I agree with the above. -  Myrikal Messmer d. Malcomb Gangemi, NP-C

## 2024-02-01 LAB — COMPREHENSIVE METABOLIC PANEL WITH GFR
ALT: 17 IU/L (ref 0–32)
AST: 21 IU/L (ref 0–40)
Albumin: 4 g/dL (ref 3.9–4.9)
Alkaline Phosphatase: 101 IU/L (ref 49–135)
BUN/Creatinine Ratio: 21 (ref 12–28)
BUN: 20 mg/dL (ref 8–27)
Bilirubin Total: 0.5 mg/dL (ref 0.0–1.2)
CO2: 21 mmol/L (ref 20–29)
Calcium: 9.2 mg/dL (ref 8.7–10.3)
Chloride: 102 mmol/L (ref 96–106)
Creatinine, Ser: 0.96 mg/dL (ref 0.57–1.00)
Globulin, Total: 2.5 g/dL (ref 1.5–4.5)
Glucose: 72 mg/dL (ref 70–99)
Potassium: 3.9 mmol/L (ref 3.5–5.2)
Sodium: 146 mmol/L — ABNORMAL HIGH (ref 134–144)
Total Protein: 6.5 g/dL (ref 6.0–8.5)
eGFR: 67 mL/min/1.73 (ref >59)

## 2024-02-01 LAB — MAGNESIUM: Magnesium: 2.2 mg/dL (ref 1.6–2.3)

## 2024-02-01 LAB — HEMOGLOBIN A1C
Est. average glucose Bld gHb Est-mCnc: 114 mg/dL
Hgb A1c MFr Bld: 5.6 % (ref 4.8–5.6)

## 2024-02-01 LAB — TSH+FREE T4
Free T4: 1.46 ng/dL (ref 0.82–1.77)
TSH: 0.512 u[IU]/mL (ref 0.450–4.500)

## 2024-02-01 LAB — VITAMIN D 25 HYDROXY (VIT D DEFICIENCY, FRACTURES): Vit D, 25-Hydroxy: 59 ng/mL (ref 30.0–100.0)

## 2024-02-01 LAB — INSULIN, RANDOM: INSULIN: 4.1 u[IU]/mL (ref 2.6–24.9)

## 2024-02-28 ENCOUNTER — Telehealth: Payer: Self-pay | Admitting: Hematology and Oncology

## 2024-02-28 NOTE — Telephone Encounter (Signed)
 Left a voicemail for pt regarding 05/07/24 appt being rescheduled to 05/08/24.

## 2024-03-03 ENCOUNTER — Ambulatory Visit (HOSPITAL_COMMUNITY)
Admission: EM | Admit: 2024-03-03 | Discharge: 2024-03-03 | Disposition: A | Discharge status: Home / Self Care | Attending: Emergency Medicine | Admitting: Emergency Medicine

## 2024-03-03 ENCOUNTER — Ambulatory Visit (HOSPITAL_COMMUNITY)

## 2024-03-03 ENCOUNTER — Encounter (HOSPITAL_COMMUNITY): Payer: Self-pay | Admitting: Emergency Medicine

## 2024-03-03 DIAGNOSIS — R051 Acute cough: Secondary | ICD-10-CM

## 2024-03-03 DIAGNOSIS — J209 Acute bronchitis, unspecified: Secondary | ICD-10-CM

## 2024-03-03 MED ORDER — BENZONATATE 100 MG PO CAPS: 100.0000 mg | ORAL_CAPSULE | Freq: Three times a day (TID) | ORAL | 0 refills | Status: AC

## 2024-03-03 MED ORDER — PROMETHAZINE-DM 6.25-15 MG/5ML PO SYRP: 5.0000 mL | ORAL_SOLUTION | Freq: Every evening | ORAL | 0 refills | Status: AC | PRN

## 2024-03-03 MED ORDER — PREDNISONE 20 MG PO TABS: 40.0000 mg | ORAL_TABLET | Freq: Every day | ORAL | 0 refills | Status: AC

## 2024-03-03 NOTE — ED Triage Notes (Signed)
 Pt reports 2-3 weeks been fatigued, coughing, congestion, sneezing, loss of appetite. Reports cough is sometimes productive and sometimes dry. Tried Nyquil, Mucinex, Theraflu.

## 2024-03-03 NOTE — ED Provider Notes (Signed)
 " [facility]    CSN: [id number] Arrival date & time: 03/03/24  1112      History   Chief Complaint Chief Complaint  Patient presents with   Fatigue   Cough    HPI [name] is a 63 y.o. female.   Patient presents with productive cough and chest congestion for about 2-1/2 weeks.  Patient states that her symptoms have progressively worsened over the last week.  Patient states that she also has been experiencing nasal congestion, sneezing, loss of appetite, and fatigue.  Patient denies any known fever.  Patient denies any chest pain, shortness of breath, or wheezing.  Patient reports that she has been taking NyQuil, Mucinex, and TheraFlu with minimal relief.  Patient reports a history of asthma and states that she has not felt the need to use her albuterol inhaler recently.  The history is provided by the patient and medical records.  Cough   Past Medical History:  Diagnosis Date   Anemia    iron def   Anxiety    Arthritis    Asthma    Breast cancer (HCC)    Diabetes mellitus    Gallbladder problem    GERD (gastroesophageal reflux disease)    HLD (hyperlipidemia)    Hypertension    Joint pain    Kidney stone    Knee pain    Personal history of radiation therapy    Vitamin Beard  deficiency     Patient Active Problem List   Diagnosis Date Noted   SOBOE (shortness of breath on exertion) 08/22/2023   BMI 34.0-34.9,adult Current BMI 34.6 05/27/2022   Type 2 diabetes mellitus with obesity 02/25/2022   At increased risk of exposure to COVID-19 virus 02/25/2022   Body mass index (BMI) 34.0-34.9, adult 10/01/2021   Chronic sinusitis 10/01/2021   Sleep disorder 10/01/2021   Mild intermittent asthma 10/01/2021   Hyperglycemia due to type 2 diabetes mellitus (HCC) 10/01/2021   Generalized anxiety disorder 10/01/2021   Diabetic renal disease (HCC) 10/01/2021   At risk for osteoporosis 10/01/2021   At risk for activity intolerance 08/04/2021   Genetic testing  07/09/2021   Malignant neoplasm of upper-outer quadrant of left breast in female, estrogen receptor positive (HCC) 06/02/2021   Abnormal vaginal bleeding 04/25/2020   Atherosclerotic heart disease of native coronary artery without angina pectoris 04/25/2020   Chronic kidney disease, stage 2 (mild) 04/25/2020   History of gastric bypass 04/25/2020   Iron deficiency anemia 01/31/2020   Keloid 01/15/2020   Intestinal malabsorption 01/08/2020   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   Vitamin Beard  deficiency 05/31/2019   Diabetes mellitus (HCC) 05/02/2019   Depression 05/02/2019   Class 3 severe obesity with serious comorbidity and body mass index (BMI) of 40.0 to 44.9 in adult (HCC) 05/02/2019   Radial styloid tenosynovitis 06/08/2018   Chronic wrist pain, left 01/24/2018   Kidney stone 07/24/2016   Ureteral stone 07/24/2016   Protein-calorie malnutrition 08/09/2014   S/P bariatric surgery 08/09/2014   Hypertension goal BP (blood pressure) < 130/80 12/28/2013   Hyperlipidemia 12/28/2013   Type 2 diabetes mellitus without complication 12/28/2013   Gastroesophageal reflux disease without esophagitis 12/28/2013    Past Surgical History:  Procedure Laterality Date   BREAST BIOPSY Left 05/15/2021   x2   BREAST BIOPSY Left 04/19/2022   MM LT BREAST BX W LOC DEV 1ST LESION IMAGE BX SPEC STEREO GUIDE 04/19/2022 [facility]   BREAST BIOPSY  06/21/2022  MM LT RADIOACTIVE SEED LOC MAMMO GUIDE 06/21/2022 [facility]   BREAST BIOPSY Left 04/20/2023   MM LT BREAST BX W LOC DEV 1ST LESION IMAGE BX SPEC STEREO GUIDE 04/20/2023 [facility]   BREAST EXCISIONAL BIOPSY Bilateral    BREAST EXCISIONAL BIOPSY Left 06/11/2021   BREAST LUMPECTOMY Left 06/11/2021   BREAST LUMPECTOMY WITH RADIOACTIVE SEED LOCALIZATION Left 06/11/2021   Procedure: LEFT BREAST LUMPECTOMY WITH RADIOACTIVE SEED LOCALIZATION X2;  Surgeon: Katrina Ned, MD;  Location: [facility];   Service: General;  Laterality: Left;   BREAST LUMPECTOMY WITH RADIOACTIVE SEED LOCALIZATION Left 06/22/2022   Procedure: LEFT BREAST LUMPECTOMY WITH RADIOACTIVE SEED LOCALIZATION;  Surgeon: Katrina Ned, MD;  Location: [facility];  Service: General;  Laterality: Left;   BREAST SURGERY     CHOLECYSTECTOMY     COLONOSCOPY     CYSTOSCOPY W/ URETERAL STENT PLACEMENT Right 07/25/2016   Procedure: CYSTOSCOPY WITH RETROGRADE PYELOGRAM/LEFT URETERAL STENT PLACEMENT;  Surgeon: Cam Morene ORN, MD;  Location: [facility];  Service: Urology;  Laterality: Right;   HAND SURGERY     ROUX-EN-Y GASTRIC BYPASS     07/15/2014    OB History     Gravida  1   Para      Term      Preterm      AB      Living  1      SAB      IAB      Ectopic      Multiple      Live Births               Home Medications    Prior to Admission medications  Medication Sig Start Date End Date Taking? Authorizing Provider  benzonatate  (TESSALON ) 100 MG capsule Take 1 capsule (100 mg total) by mouth every 8 (eight) hours. 03/03/24  Yes Johnie, Kortni Hasten A, NP  predniSONE  (DELTASONE ) 20 MG tablet Take 2 tablets (40 mg total) by mouth daily for 5 days. 03/03/24 03/08/24 Yes Johnie Flaming A, NP  promethazine -dextromethorphan (PROMETHAZINE -DM) 6.25-15 MG/5ML syrup Take 5 mLs by mouth at bedtime as needed for cough. 03/03/24  Yes Johnie Flaming A, NP  Accu-Chek Softclix Lancets lancets Use to check blood sugar twice daily. 06/13/23   Danford, Katrina D, NP  ALBUTEROL IN Inhale 1-2 puffs into the lungs every 4 (four) hours as needed (Wheezing/SOB).    [provider]  ALPRAZolam  (XANAX ) 0.25 MG tablet Take 0.25 mg by mouth at bedtime as needed for anxiety or sleep. 05/30/23   [provider]  amLODipine  (NORVASC ) 5 MG tablet Take 5 mg by mouth daily. 06/06/23   [provider]  aspirin 81 MG chewable tablet Chew 81 mg by mouth daily. 10/26/11   [provider]   atorvastatin  (LIPITOR) 20 MG tablet Take 20 mg by mouth daily.    [provider]  blood glucose meter kit and supplies KIT Use to check blood sugar up to four times daily as directed. 06/13/23   Katrina Pee D, NP  Blood Pressure Monitoring (B-Beard ASSURE BPM/DELUXE ARM CUFF) MISC Use daily as directed 05/11/23   Danford, Katrina D, NP  etodolac (LODINE) 400 MG tablet Take 400 mg by mouth 2 (two) times daily. 06/07/23   [provider]  ferrous sulfate  (FERROUSUL) 325 (65 FE) MG tablet Take 1 tablet (325 mg total) by mouth daily with breakfast. 09/01/21   Danford, Pee BIRCH, NP  fluticasone  (FLONASE ) 50 MCG/ACT nasal  spray Place 1 spray into both nostrils in the morning and at bedtime. Patient taking differently: Place 1 spray into both nostrils 2 (two) times daily as needed for allergies or rhinitis. 08/11/20   Stuart Vernell Norris, PA-C  GAVILYTE-N WITH FLAVOR PACK 420 g solution as directed. 08/15/23   [provider]  glucose blood (FREESTYLE TEST STRIPS) test strip Use to check blood sugar twice daily. 06/13/23   Danford, Rockie D, NP  metFORMIN  (GLUCOPHAGE ) 500 MG tablet Take 1 tablet (500 mg total) by mouth daily with lunch. 01/31/24   Danford, Rockie D, NP  Multiple Vitamin (MULTIVITAMIN PO) Take 1 tablet by mouth daily.    [provider]  pantoprazole  (PROTONIX ) 40 MG tablet Take 40 mg by mouth daily as needed (Indigestion). 08/06/22   [provider]  Semaglutide , 2 MG/DOSE, 8 MG/3ML SOPN Inject 2 mg into the skin once a week. 11/30/23   Danford, Katrina D, NP  tamoxifen  (NOLVADEX ) 20 MG tablet Take 1 tablet by mouth once daily 08/01/23   Causey, Lindsey Cornetto, NP  traZODone (DESYREL) 50 MG tablet Take 50 mg by mouth at bedtime as needed for sleep.    [provider]  valsartan (DIOVAN) 320 MG tablet Take 320 mg by mouth daily.    [provider]  Vitamin Beard , Ergocalciferol , (DRISDOL ) 1.25 MG (50000 UNIT) CAPS capsule Take 1 capsule (50,000 Units  total) by mouth every 7 (seven) days. 12/28/23   Katrina Rockie BIRCH, NP    Family History Family History  Problem Relation Age of Onset   Breast cancer Mother 70   Hypertension Mother    Hyperlipidemia Mother    Obesity Mother    Hypertension Father    Heart disease Father     Social History Social History[1]   Allergies   Bisoprolol fumarate, Hydrochlorothiazide, Ibuprofen , Liraglutide, Lisinopril-hydrochlorothiazide, and Oxycodone    Review of Systems Review of Systems  Respiratory:  Positive for cough.    Per HPI  Physical Exam Triage Vital Signs ED Triage Vitals [03/03/24 1140]  Encounter Vitals Group     BP 132/65     Girls Systolic BP Percentile      Girls Diastolic BP Percentile      Boys Systolic BP Percentile      Boys Diastolic BP Percentile      Pulse Rate 67     Resp 18     Temp 98 F (36.7 C)     Temp Source Oral     SpO2 96 %     Weight      Height      Head Circumference      Peak Flow      Pain Score 0     Pain Loc      Pain Education      Exclude from Growth Chart    No data found.  Updated Vital Signs BP 132/65 (BP Location: Right Arm)   Pulse 67   Temp 98 F (36.7 C) (Oral)   Resp 18   LMP 11/09/2021   SpO2 96%   Visual Acuity Right Eye Distance:   Left Eye Distance:   Bilateral Distance:    Right Eye Near:   Left Eye Near:    Bilateral Near:     Physical Exam Vitals and nursing note reviewed.  Constitutional:      General: She is awake. She is not in acute distress.    Appearance: Normal appearance. She is well-developed and well-groomed. She is not  ill-appearing.  HENT:     Right Ear: Tympanic membrane, ear canal and external ear normal.     Left Ear: Tympanic membrane, ear canal and external ear normal.     Nose: Congestion and rhinorrhea present.     Mouth/Throat:     Mouth: Mucous membranes are moist.     Pharynx: Posterior oropharyngeal erythema and postnasal drip present. No oropharyngeal exudate.   Cardiovascular:     Rate and Rhythm: Normal rate and regular rhythm.  Pulmonary:     Effort: Pulmonary effort is normal.     Breath sounds: Normal breath sounds.  Skin:    General: Skin is warm and dry.  Neurological:     Mental Status: She is alert.  Psychiatric:        Behavior: Behavior is cooperative.      UC Treatments / Results  Labs (all labs ordered are listed, but only abnormal results are displayed) Labs Reviewed - No data to display  EKG   Radiology DG Chest 2 View Result Date: 03/03/2024 EXAM: 2 VIEW(S) XRAY OF THE CHEST 03/03/2024 12:23:32 PM COMPARISON: 08/29/2023 CLINICAL HISTORY: cough and chest congestion x 2 1/2 weeks FINDINGS: LUNGS AND PLEURA: No focal pulmonary opacity. No pleural effusion. No pneumothorax. HEART AND MEDIASTINUM: No acute abnormality of the cardiac and mediastinal silhouettes. BONES AND SOFT TISSUES: Cholecystectomy clips noted. Multilevel thoracic osteophytosis. No acute fracture or destructive lesion. IMPRESSION: 1. No acute cardiopulmonary abnormality. 2. Incidental cholecystectomy clips and multilevel thoracic spondylosis. Electronically signed by: Evalene Coho MD 03/03/2024 12:35 PM EST RP Workstation: [id number]    Procedures Procedures (including critical care time)  Medications Ordered in UC Medications - No data to display  Initial Impression / Assessment and Plan / UC Course  I have reviewed the triage vital signs and the nursing notes.  Pertinent labs & imaging results that were available during my care of the patient were reviewed by me and considered in my medical decision making (see chart for details).     Patient is overall well-appearing.  Vitals are stable.  Heart and lung sounds normal.  Chest x-ray ordered to rule out underlying pneumonia due to duration of symptoms.  I independently interpreted these images and there is no active cardiopulmonary disease.  Radiology report confirms this.  Suspect symptoms are  likely related to an acute bronchitis.  Prescribed prednisone  burst for this.  Prescribed Tessalon  and Promethazine  DM as needed for cough.  Discussed when to use albuterol inhaler.  Discussed over-the-counter medications for symptoms.  Discussed follow-up and return precautions. Final Clinical Impressions(s) / UC Diagnoses   Final diagnoses:  Acute cough  Acute bronchitis, unspecified organism     Discharge Instructions      Your x-ray is negative for any underlying pneumonia.  As discussed I believe your symptoms are likely related to an ongoing bronchitis. Start taking 2 tablets of prednisone  once daily for 5 days to help with this. You can take Tessalon  every 8 hours as needed for cough.  You can take Promethazine  DM cough syrup at bedtime as needed for cough.  This can make you drowsy so do not drive, work, or drink alcohol while taking this. Use albuterol inhaler every 4-6 hours as needed for any shortness of breath or wheezing and to help break up chest congestion. You can continue to take over-the-counter Mucinex for cough and congestion. Follow-up with your primary care provider or return here as needed.     ED Prescriptions  Medication Sig Dispense Auth. Provider   predniSONE  (DELTASONE ) 20 MG tablet Take 2 tablets (40 mg total) by mouth daily for 5 days. 10 tablet Johnie Flaming A, NP   promethazine -dextromethorphan (PROMETHAZINE -DM) 6.25-15 MG/5ML syrup Take 5 mLs by mouth at bedtime as needed for cough. 118 mL Johnie Flaming A, NP   benzonatate  (TESSALON ) 100 MG capsule Take 1 capsule (100 mg total) by mouth every 8 (eight) hours. 21 capsule Johnie Flaming A, NP      PDMP not reviewed this encounter.    [1]  Social History Tobacco Use   Smoking status: Never   Smokeless tobacco: Never  Vaping Use   Vaping status: Never Used  Substance Use Topics   Alcohol use: Yes    Comment: occassional   Drug use: No     Johnie Flaming LABOR, NP 03/03/24 1243  "

## 2024-03-03 NOTE — Discharge Instructions (Signed)
 Your x-ray is negative for any underlying pneumonia.  As discussed I believe your symptoms are likely related to an ongoing bronchitis. Start taking 2 tablets of prednisone  once daily for 5 days to help with this. You can take Tessalon  every 8 hours as needed for cough.  You can take Promethazine  DM cough syrup at bedtime as needed for cough.  This can make you drowsy so do not drive, work, or drink alcohol while taking this. Use albuterol inhaler every 4-6 hours as needed for any shortness of breath or wheezing and to help break up chest congestion. You can continue to take over-the-counter Mucinex for cough and congestion. Follow-up with your primary care provider or return here as needed.

## 2024-03-05 ENCOUNTER — Ambulatory Visit (INDEPENDENT_AMBULATORY_CARE_PROVIDER_SITE_OTHER): Admitting: Adult Health

## 2024-03-05 ENCOUNTER — Encounter (INDEPENDENT_AMBULATORY_CARE_PROVIDER_SITE_OTHER): Payer: Self-pay | Admitting: Adult Health

## 2024-03-05 ENCOUNTER — Other Ambulatory Visit (HOSPITAL_COMMUNITY): Payer: Self-pay

## 2024-03-05 VITALS — BP 138/87 | HR 85 | Temp 98.6°F | Ht 62.0 in | Wt 189.0 lb

## 2024-03-05 DIAGNOSIS — Z7985 Long-term (current) use of injectable non-insulin antidiabetic drugs: Secondary | ICD-10-CM

## 2024-03-05 DIAGNOSIS — Z6834 Body mass index (BMI) 34.0-34.9, adult: Secondary | ICD-10-CM

## 2024-03-05 DIAGNOSIS — E1159 Type 2 diabetes mellitus with other circulatory complications: Secondary | ICD-10-CM

## 2024-03-05 DIAGNOSIS — E669 Obesity, unspecified: Secondary | ICD-10-CM | POA: Diagnosis not present

## 2024-03-05 DIAGNOSIS — E1169 Type 2 diabetes mellitus with other specified complication: Secondary | ICD-10-CM

## 2024-03-05 DIAGNOSIS — I152 Hypertension secondary to endocrine disorders: Secondary | ICD-10-CM | POA: Diagnosis not present

## 2024-03-05 DIAGNOSIS — R059 Cough, unspecified: Secondary | ICD-10-CM | POA: Diagnosis not present

## 2024-03-05 DIAGNOSIS — Z0000 Encounter for general adult medical examination without abnormal findings: Secondary | ICD-10-CM

## 2024-03-05 DIAGNOSIS — E559 Vitamin D deficiency, unspecified: Secondary | ICD-10-CM

## 2024-03-05 MED ORDER — VITAMIN D (ERGOCALCIFEROL) 1.25 MG (50000 UNIT) PO CAPS
50000.0000 [IU] | ORAL_CAPSULE | ORAL | 0 refills | Status: DC
  Filled 2024-03-05: qty 4, 28d supply, fill #0

## 2024-03-05 MED ORDER — SEMAGLUTIDE (2 MG/DOSE) 8 MG/3ML ~~LOC~~ SOPN
2.0000 mg | PEN_INJECTOR | SUBCUTANEOUS | 0 refills | Status: DC
  Filled 2024-03-05: qty 9, 84d supply, fill #0

## 2024-03-05 NOTE — Progress Notes (Signed)
 "    WEIGHT SUMMARY AND BIOMETRICS  Vitals Temp: 98.6 F (37 C) BP: 138/87 Pulse Rate: 85 SpO2: 99 %   Anthropometric Measurements Height: 5' 2 (1.575 m) Weight: 189 lb (85.7 kg) BMI (Calculated): 34.56 Weight at Last Visit: 196lb Weight Lost Since Last Visit: 7lb Weight Gained Since Last Visit: 0lb Starting Weight: 231lb Total Weight Loss (lbs): 42 lb (19.1 kg)   Body Composition  Body Fat %: 44.7 % Fat Mass (lbs): 84.8 lbs Muscle Mass (lbs): 99.6 lbs Total Body Water (lbs): 69 lbs Visceral Fat Rating : 13   Other Clinical Data Fasting: No Labs: no Today's Visit #: 66 Starting Date: 04/03/19    Chief Complaint:   OBESITY Katrina Beard is here to discuss her progress with her obesity treatment plan.  She is on the practicing portion control and making smarter food choices, such as increasing vegetables and decreasing simple carbohydrates and states she is following her eating plan approximately 0 % of the time.  She states she is exercising: None  Interim History:  She was recently dx'd with bronchitis- treated with prednisone , tessalon , and promethazine  DM She reports slight reduction in acute sx's She presents appearing stable, yet fatigued and in a surgical mask.  Reviewed recent UC notes, imaging.  She hopes to have her current foster daughter removed from her home the first week of Jan 2026  Subjective:   1. Healthcare maintenance Discussed Labs  Latest Reference Range & Units 01/31/24 11:25  TSH 0.450 - 4.500 uIU/mL 0.512  T4,Free(Direct) 0.82 - 1.77 ng/dL 8.53   TSH and Free T4- stable She is not on Synthroid therapy  2. Cough, unspecified type 03/03/2024 History              Chief Complaint    Chief Complaint  Patient presents with   Fatigue   Cough      HPI Katrina Beard is a 63 y.o. female.    Patient presents with productive cough and chest congestion for about 2-1/2 weeks.  Patient states that her symptoms have progressively  worsened over the last week.  Patient states that she also has been experiencing nasal congestion, sneezing, loss of appetite, and fatigue.  Patient denies any known fever.  Patient denies any chest pain, shortness of breath, or wheezing.  Patient reports that she has been taking NyQuil, Mucinex, and TheraFlu with minimal relief.  Patient reports a history of asthma and states that she has not felt the need to use her albuterol inhaler recently.  The history is provided by the patient and medical records.   3. Type 2 diabetes mellitus with other specified complication, without long-term current use of insulin  (HCC) Discussed Labs  Latest Reference Range & Units 01/31/24 11:25  Glucose 70 - 99 mg/dL 72  Hemoglobin J8R 4.8 - 5.6 % 5.6  Est. average glucose Bld gHb Est-mCnc mg/dL 885  INSULIN  2.6 - 24.9 uIU/mL 4.1    Latest Reference Range & Units 01/31/24 11:25  eGFR >59 mL/min/1.73 67   CBG, A1c, and Insulin  ALL AT GOAL GFR > 60 She is on Metformin  500mg  daily and Ozempic  2mg  weekly She is not checking home CBG, however denies sx's of hypoglycemia Denies mass in neck, dysphagia, dyspepsia, persistent hoarseness, abdominal pain, or N/V/C   4. Hypertension associated with type 2 diabetes mellitus (HCC) Discussed Labs 01/31/2024 CMP: Electrolytes, Liver Enzymes, Kidney Fx- stable BP slightly elevated at OV She is acutely ill with bronchitis She denies respiratory distress  5.  Vitamin D  deficiency Discussed Labs  Latest Reference Range & Units 01/31/24 11:25  Vitamin D , 25-Hydroxy 30.0 - 100.0 ng/mL 59.0   Vit D Level stable and at goal She is on weekly Ergocalciferol - denies N/V/Muscle Weakness  Assessment/Plan:   1. Healthcare maintenance Monitor Labs  2. Cough, unspecified type Discharge Instructions        Your x-ray is negative for any underlying pneumonia.  As discussed I believe your symptoms are likely related to an ongoing bronchitis. Start taking 2 tablets of  prednisone  once daily for 5 days to help with this. You can take Tessalon  every 8 hours as needed for cough.  You can take Promethazine  DM cough syrup at bedtime as needed for cough.  This can make you drowsy so do not drive, work, or drink alcohol while taking this. Use albuterol inhaler every 4-6 hours as needed for any shortness of breath or wheezing and to help break up chest congestion. You can continue to take over-the-counter Mucinex for cough and congestion. Follow-up with your primary care provider or return here as needed.  3. Type 2 diabetes mellitus with other specified complication, without long-term current use of insulin  (HCC) (Primary) Refill - Semaglutide , 2 MG/DOSE, 8 MG/3ML SOPN; Inject 2 mg into the skin once a week.  Dispense: 9 mL; Refill: 0  4. Hypertension associated with type 2 diabetes mellitus (HCC) Continue healthy eating Resume regular exercise after acute illness sx's resolve  5. Vitamin D  deficiency Refill - Vitamin D , Ergocalciferol , (DRISDOL ) 1.25 MG (50000 UNIT) CAPS capsule; Take 1 capsule (50,000 Units total) by mouth every 7 (seven) days.  Dispense: 4 capsule; Refill: 0  6. Current BMI 34.7  Katrina Beard is not currently in the action stage of change. As such, her goal is to maintain weight for now. She has agreed to practicing portion control and making smarter food choices, such as increasing vegetables and decreasing simple carbohydrates.   Exercise goals: No exercise has been prescribed at this time.  Behavioral modification strategies: increasing lean protein intake, decreasing simple carbohydrates, increasing vegetables, increasing water intake, meal planning and cooking strategies, keeping healthy foods in the home, ways to avoid boredom eating, ways to avoid night time snacking, holiday eating strategies , celebration eating strategies, and planning for success.  Katrina Beard has agreed to follow-up with our clinic in 4 weeks. She was informed of the  importance of frequent follow-up visits to maximize her success with intensive lifestyle modifications for her multiple health conditions.   Objective:   Blood pressure 138/87, pulse 85, temperature 98.6 F (37 C), height 5' 2 (1.575 m), weight 189 lb (85.7 kg), last menstrual period 11/09/2021, SpO2 99%. Body mass index is 34.57 kg/m.  General: Cooperative, alert, well developed, in no acute distress. HEENT: Conjunctivae and lids unremarkable. Cardiovascular: Regular rhythm.  Lungs: Normal work of breathing. Neurologic: No focal deficits.   Lab Results  Component Value Date   CREATININE 0.96 01/31/2024   BUN 20 01/31/2024   NA 146 (H) 01/31/2024   K 3.9 01/31/2024   CL 102 01/31/2024   CO2 21 01/31/2024   Lab Results  Component Value Date   ALT 17 01/31/2024   AST 21 01/31/2024   ALKPHOS 101 01/31/2024   BILITOT 0.5 01/31/2024   Lab Results  Component Value Date   HGBA1C 5.6 01/31/2024   HGBA1C 6.0 (H) 08/22/2023   HGBA1C 5.9 (H) 04/11/2023   HGBA1C 6.0 (H) 12/02/2022   HGBA1C 5.7 (H) 03/25/2022   Lab Results  Component Value Date   INSULIN  4.1 01/31/2024   INSULIN  6.7 08/22/2023   INSULIN  5.5 04/11/2023   INSULIN  6.4 12/02/2022   INSULIN  5.2 03/25/2022   Lab Results  Component Value Date   TSH 0.512 01/31/2024   Lab Results  Component Value Date   CHOL 161 04/15/2021   HDL 55 04/15/2021   LDLCALC 91 04/15/2021   TRIG 80 04/15/2021   Lab Results  Component Value Date   VD25OH 59.0 01/31/2024   VD25OH 49.9 08/22/2023   VD25OH 50.7 04/11/2023   Lab Results  Component Value Date   WBC 5.6 08/29/2023   HGB 12.8 08/29/2023   HCT 40.0 08/29/2023   MCV 95.0 08/29/2023   PLT 209 08/29/2023   Lab Results  Component Value Date   IRON 162 04/15/2021   TIBC 252 06/24/2020   FERRITIN 64 06/24/2020   Attestation Statements:   Reviewed by clinician on day of visit: allergies, medications, problem list, medical history, surgical history, family  history, social history, and previous encounter notes.  I have reviewed the above documentation for accuracy and completeness, and I agree with the above. - Dontreal Miera d. Sully Manzi, NP-C "

## 2024-03-12 ENCOUNTER — Telehealth (INDEPENDENT_AMBULATORY_CARE_PROVIDER_SITE_OTHER): Payer: Self-pay

## 2024-03-12 ENCOUNTER — Telehealth (INDEPENDENT_AMBULATORY_CARE_PROVIDER_SITE_OTHER): Payer: Self-pay | Admitting: Adult Health

## 2024-03-12 ENCOUNTER — Other Ambulatory Visit (HOSPITAL_COMMUNITY): Payer: Self-pay

## 2024-03-12 DIAGNOSIS — E1169 Type 2 diabetes mellitus with other specified complication: Secondary | ICD-10-CM

## 2024-03-12 DIAGNOSIS — E559 Vitamin D deficiency, unspecified: Secondary | ICD-10-CM

## 2024-03-12 MED ORDER — VITAMIN D (ERGOCALCIFEROL) 1.25 MG (50000 UNIT) PO CAPS: 50000.0000 [IU] | ORAL_CAPSULE | ORAL | 0 refills | Status: DC

## 2024-03-12 MED ORDER — SEMAGLUTIDE (2 MG/DOSE) 8 MG/3ML ~~LOC~~ SOPN: 2.0000 mg | PEN_INJECTOR | SUBCUTANEOUS | 0 refills | Status: AC

## 2024-03-12 NOTE — Addendum Note (Signed)
 Addended by: ONEITA JOSETTE CROME on: 03/12/2024 09:17 AM   Modules accepted: Orders

## 2024-03-12 NOTE — Telephone Encounter (Signed)
 Resent to correct pharmacy.

## 2024-03-12 NOTE — Telephone Encounter (Signed)
 Katrina Beard  (Key: [id number]) Rx #: [id number] Ozempic  (2 MG/DOSE) 8MG Katrina Beard pen-injectors

## 2024-03-12 NOTE — Telephone Encounter (Signed)
 Pt called and her medication was sent to the wrong pharmacy at her appt on 12/22. I took out all the incorrect pharmacy's. She would like it to be sent to Arlo[facility] Prior at [facility].

## 2024-03-13 ENCOUNTER — Telehealth (INDEPENDENT_AMBULATORY_CARE_PROVIDER_SITE_OTHER): Payer: Self-pay | Admitting: Adult Health

## 2024-03-13 NOTE — Telephone Encounter (Signed)
 Called to make sure her Ozempic  and Vitamin D  are being routed to the [facility] on [address].  Thanks!

## 2024-03-16 ENCOUNTER — Other Ambulatory Visit (HOSPITAL_COMMUNITY): Payer: Self-pay

## 2024-03-21 ENCOUNTER — Other Ambulatory Visit: Payer: Self-pay | Admitting: Family Medicine

## 2024-03-21 ENCOUNTER — Other Ambulatory Visit: Payer: Self-pay | Admitting: Hematology and Oncology

## 2024-03-21 DIAGNOSIS — Z1231 Encounter for screening mammogram for malignant neoplasm of breast: Secondary | ICD-10-CM

## 2024-03-21 DIAGNOSIS — N6012 Diffuse cystic mastopathy of left breast: Secondary | ICD-10-CM

## 2024-04-05 ENCOUNTER — Encounter (INDEPENDENT_AMBULATORY_CARE_PROVIDER_SITE_OTHER): Payer: Self-pay | Admitting: Adult Health

## 2024-04-05 ENCOUNTER — Ambulatory Visit (INDEPENDENT_AMBULATORY_CARE_PROVIDER_SITE_OTHER): Admitting: Adult Health

## 2024-04-05 VITALS — BP 140/70 | HR 87 | Temp 98.7°F | Ht 62.0 in | Wt 194.0 lb

## 2024-04-05 DIAGNOSIS — Z6835 Body mass index (BMI) 35.0-35.9, adult: Secondary | ICD-10-CM | POA: Diagnosis not present

## 2024-04-05 DIAGNOSIS — M25562 Pain in left knee: Secondary | ICD-10-CM

## 2024-04-05 DIAGNOSIS — Z7984 Long term (current) use of oral hypoglycemic drugs: Secondary | ICD-10-CM | POA: Diagnosis not present

## 2024-04-05 DIAGNOSIS — I10 Essential (primary) hypertension: Secondary | ICD-10-CM | POA: Diagnosis not present

## 2024-04-05 DIAGNOSIS — E559 Vitamin D deficiency, unspecified: Secondary | ICD-10-CM | POA: Diagnosis not present

## 2024-04-05 DIAGNOSIS — E669 Obesity, unspecified: Secondary | ICD-10-CM | POA: Diagnosis not present

## 2024-04-05 DIAGNOSIS — Z7985 Long-term (current) use of injectable non-insulin antidiabetic drugs: Secondary | ICD-10-CM | POA: Diagnosis not present

## 2024-04-05 DIAGNOSIS — E1159 Type 2 diabetes mellitus with other circulatory complications: Secondary | ICD-10-CM

## 2024-04-05 DIAGNOSIS — G8929 Other chronic pain: Secondary | ICD-10-CM

## 2024-04-05 DIAGNOSIS — E1169 Type 2 diabetes mellitus with other specified complication: Secondary | ICD-10-CM

## 2024-04-05 MED ORDER — VITAMIN D (ERGOCALCIFEROL) 1.25 MG (50000 UNIT) PO CAPS: 50000.0000 [IU] | ORAL_CAPSULE | ORAL | 0 refills | Status: AC

## 2024-04-05 NOTE — Progress Notes (Signed)
 "    WEIGHT SUMMARY AND BIOMETRICS  Vitals Temp: 98.7 F (37.1 C) BP: (!) 140/70 Pulse Rate: 87 SpO2: 98 %   Anthropometric Measurements Height: 5' 2 (1.575 m) Weight: 194 lb (88 kg) BMI (Calculated): 35.47 Weight at Last Visit: 189lb Weight Lost Since Last Visit: 0lb Weight Gained Since Last Visit: 5lb Starting Weight: 231lb Total Weight Loss (lbs): 37 lb (16.8 kg)   Body Composition  Body Fat %: 48.1 % Fat Mass (lbs): 93.6 lbs Muscle Mass (lbs): 96 lbs Total Body Water (lbs): 79.2 lbs Visceral Fat Rating : 14   Other Clinical Data Fasting: No Labs: no Today's Visit #: 23 Starting Date: 04/03/19    Chief Complaint:   OBESITY Katrina Beard is here to discuss her progress with her obesity treatment plan.  She is on the practicing portion control and making smarter food choices, such as increasing vegetables and decreasing simple carbohydrates and states she is following her eating plan approximately 70 % of the time.  She states she is exercising: NEAT Activities  Interim History:  Katrina Beard continues to house the same 62 year old female foster child- has been with her > 16 months. This child continues to exhibit antisocial behavior and has missed 101 days and earned 30 tardy's in the 2025-2026 school year. Katrina Beard has been requesting a transfer for this foster child for months, hopes it will be completed next Friday 04/13/24. Katrina Beard endorses stress and associated emotional eating behaviors the last several months.  Exercise-Chronic L Knee pain- mobility limited  Subjective:   1. Vitamin D  deficiency  Latest Reference Range & Units 04/11/23 12:39 08/22/23 10:34 01/31/24 11:25  Vitamin D , 25-Hydroxy 30.0 - 100.0 ng/mL 50.7 49.9 59.0   Vit D Level stable She is on weekly Ergocalciferol - denies N/V/Muscle Weakness  2. Type 2 diabetes mellitus with other specified complication, without long-term current use of insulin  (HCC) Lab Results  Component Value Date    HGBA1C 5.6 01/31/2024   HGBA1C 6.0 (H) 08/22/2023   HGBA1C 5.9 (H) 04/11/2023    She has not been checking home CBG She denies sx's of hypoglycemia She is on Metformin  500mg  once daily and Ozempic  2mg  once weekly Denies mass in neck, dysphagia, dyspepsia, persistent hoarseness, abdominal pain, or N/V/C   3. Hypertension associated with type 2 diabetes mellitus (HCC) SBP slightly above goal at OV She denies CP with exertion  4. Chronic pain of left knee 09/05/2023 L THR was cancelled due to unexpected out of pocket expense that was requested at time of service. She is able to perform ADLs and work without major disability.  Assessment/Plan:   1. Vitamin D  deficiency (Primary) Refill - Vitamin D , Ergocalciferol , (DRISDOL ) 1.25 MG (50000 UNIT) CAPS capsule; Take 1 capsule (50,000 Units total) by mouth every 7 (seven) days.  Dispense: 4 capsule; Refill: 0  2. Type 2 diabetes mellitus with other specified complication, without long-term current use of insulin  (HCC) Increase lean protein and limit sugar/simple CHO Continue current antidiabetic medications, denies need for refill today  3. Hypertension associated with type 2 diabetes mellitus (HCC) Limit na+ Increase activity as tolerated Continue amLODipine  (NORVASC ) 5 MG tablet  aspirin 81 MG chewable tablet  atorvastatin  (LIPITOR) 20 MG tablet  valsartan (DIOVAN) 320 MG tablet   4. Chronic pain of left knee Continue with weight loss efforts F/u with Orthopedic Care Team as directed  5. Current BMI 35.6  Katrina Beard is currently in the action stage of change. As such, her goal  is to continue with weight loss efforts. She has agreed to practicing portion control and making smarter food choices, such as increasing vegetables and decreasing simple carbohydrates.   Exercise goals: All adults should avoid inactivity. Some physical activity is better than none, and adults who participate in any amount of physical activity gain some  health benefits. Adults should also include muscle-strengthening activities that involve all major muscle groups on 2 or more days a week.  Behavioral modification strategies: increasing lean protein intake, decreasing simple carbohydrates, increasing vegetables, increasing water intake, no skipping meals, meal planning and cooking strategies, keeping healthy foods in the home, better snacking choices, and planning for success.  Katrina Beard has agreed to follow-up with our clinic in 4 weeks. She was informed of the importance of frequent follow-up visits to maximize her success with intensive lifestyle modifications for her multiple health conditions.   Objective:   Blood pressure (!) 140/70, pulse 87, temperature 98.7 F (37.1 C), height 5' 2 (1.575 m), weight 194 lb (88 kg), last menstrual period 11/09/2021, SpO2 98%. Body mass index is 35.48 kg/m.  General: Cooperative, alert, well developed, in no acute distress. HEENT: Conjunctivae and lids unremarkable. Cardiovascular: Regular rhythm.  Lungs: Normal work of breathing. Neurologic: No focal deficits.   Lab Results  Component Value Date   CREATININE 0.96 01/31/2024   BUN 20 01/31/2024   NA 146 (H) 01/31/2024   K 3.9 01/31/2024   CL 102 01/31/2024   CO2 21 01/31/2024   Lab Results  Component Value Date   ALT 17 01/31/2024   AST 21 01/31/2024   ALKPHOS 101 01/31/2024   BILITOT 0.5 01/31/2024   Lab Results  Component Value Date   HGBA1C 5.6 01/31/2024   HGBA1C 6.0 (H) 08/22/2023   HGBA1C 5.9 (H) 04/11/2023   HGBA1C 6.0 (H) 12/02/2022   HGBA1C 5.7 (H) 03/25/2022   Lab Results  Component Value Date   INSULIN  4.1 01/31/2024   INSULIN  6.7 08/22/2023   INSULIN  5.5 04/11/2023   INSULIN  6.4 12/02/2022   INSULIN  5.2 03/25/2022   Lab Results  Component Value Date   TSH 0.512 01/31/2024   Lab Results  Component Value Date   CHOL 161 04/15/2021   HDL 55 04/15/2021   LDLCALC 91 04/15/2021   TRIG 80 04/15/2021   Lab  Results  Component Value Date   VD25OH 59.0 01/31/2024   VD25OH 49.9 08/22/2023   VD25OH 50.7 04/11/2023   Lab Results  Component Value Date   WBC 5.6 08/29/2023   HGB 12.8 08/29/2023   HCT 40.0 08/29/2023   MCV 95.0 08/29/2023   PLT 209 08/29/2023   Lab Results  Component Value Date   IRON 162 04/15/2021   TIBC 252 06/24/2020   FERRITIN 64 06/24/2020   Attestation Statements:   Reviewed by clinician on day of visit: allergies, medications, problem list, medical history, surgical history, family history, social history, and previous encounter notes.  I have reviewed the above documentation for accuracy and completeness, and I agree with the above. -  Thaily Hackworth d. Karlton Maya, NP-C "

## 2024-04-17 ENCOUNTER — Ambulatory Visit

## 2024-04-23 ENCOUNTER — Encounter

## 2024-04-23 ENCOUNTER — Other Ambulatory Visit

## 2024-05-01 ENCOUNTER — Ambulatory Visit (INDEPENDENT_AMBULATORY_CARE_PROVIDER_SITE_OTHER): Admitting: Nurse Practitioner

## 2024-05-03 ENCOUNTER — Ambulatory Visit (INDEPENDENT_AMBULATORY_CARE_PROVIDER_SITE_OTHER): Admitting: Adult Health

## 2024-05-07 ENCOUNTER — Ambulatory Visit: Payer: 59 | Admitting: Hematology and Oncology

## 2024-05-08 ENCOUNTER — Inpatient Hospital Stay: Admitting: Hematology and Oncology
